# Patient Record
Sex: Female | Born: 1955
Health system: Southern US, Community
[De-identification: ages and names within clinical notes are randomized; demographics above are authoritative.]

## PROBLEM LIST (undated history)

## (undated) DIAGNOSIS — I10 Essential (primary) hypertension: Secondary | ICD-10-CM

## (undated) DIAGNOSIS — E785 Hyperlipidemia, unspecified: Secondary | ICD-10-CM

## (undated) DIAGNOSIS — N6019 Diffuse cystic mastopathy of unspecified breast: Secondary | ICD-10-CM

## (undated) DIAGNOSIS — M25475 Effusion, left foot: Secondary | ICD-10-CM

## (undated) DIAGNOSIS — Z86718 Personal history of other venous thrombosis and embolism: Secondary | ICD-10-CM

## (undated) DIAGNOSIS — G4730 Sleep apnea, unspecified: Secondary | ICD-10-CM

## (undated) DIAGNOSIS — Z85828 Personal history of other malignant neoplasm of skin: Secondary | ICD-10-CM

## (undated) DIAGNOSIS — R7303 Prediabetes: Secondary | ICD-10-CM

## (undated) DIAGNOSIS — E119 Type 2 diabetes mellitus without complications: Secondary | ICD-10-CM

## (undated) DIAGNOSIS — R011 Cardiac murmur, unspecified: Secondary | ICD-10-CM

## (undated) DIAGNOSIS — M25474 Effusion, right foot: Secondary | ICD-10-CM

## (undated) DIAGNOSIS — M25472 Effusion, left ankle: Secondary | ICD-10-CM

## (undated) DIAGNOSIS — M1990 Unspecified osteoarthritis, unspecified site: Secondary | ICD-10-CM

## (undated) DIAGNOSIS — C4490 Unspecified malignant neoplasm of skin, unspecified: Secondary | ICD-10-CM

## (undated) DIAGNOSIS — E876 Hypokalemia: Secondary | ICD-10-CM

## (undated) DIAGNOSIS — M25471 Effusion, right ankle: Secondary | ICD-10-CM

## (undated) HISTORY — DX: Sleep apnea, unspecified: G47.30

## (undated) HISTORY — DX: Essential (primary) hypertension: I10

## (undated) HISTORY — DX: Personal history of other venous thrombosis and embolism: Z86.718

## (undated) HISTORY — DX: Effusion, left ankle: M25.472

## (undated) HISTORY — DX: Prediabetes: R73.03

## (undated) HISTORY — DX: Effusion, right foot: M25.474

## (undated) HISTORY — DX: Type 2 diabetes mellitus without complications: E11.9

## (undated) HISTORY — DX: Diffuse cystic mastopathy of unspecified breast: N60.19

## (undated) HISTORY — DX: Effusion, right ankle: M25.475

## (undated) HISTORY — DX: Hypokalemia: E87.6

## (undated) HISTORY — DX: Effusion, right ankle: M25.471

---
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
## 2001-08-09 ENCOUNTER — Other Ambulatory Visit: Admission: RE | Admit: 2001-08-09 | Discharge: 2001-08-09 | Payer: Self-pay | Admitting: Internal Medicine

## 2002-09-26 ENCOUNTER — Other Ambulatory Visit: Admission: RE | Admit: 2002-09-26 | Discharge: 2002-09-26 | Payer: Self-pay | Admitting: Internal Medicine

## 2003-11-04 ENCOUNTER — Other Ambulatory Visit: Admission: RE | Admit: 2003-11-04 | Discharge: 2003-11-04 | Payer: Self-pay | Admitting: Internal Medicine

## 2004-12-23 ENCOUNTER — Other Ambulatory Visit: Admission: RE | Admit: 2004-12-23 | Discharge: 2004-12-23 | Payer: Self-pay | Admitting: Internal Medicine

## 2005-04-14 ENCOUNTER — Emergency Department: Payer: Self-pay | Admitting: Emergency Medicine

## 2005-04-24 DIAGNOSIS — Z86718 Personal history of other venous thrombosis and embolism: Secondary | ICD-10-CM

## 2005-04-24 HISTORY — PX: OOPHORECTOMY: SHX86

## 2005-04-24 HISTORY — DX: Personal history of other venous thrombosis and embolism: Z86.718

## 2006-01-03 ENCOUNTER — Other Ambulatory Visit: Admission: RE | Admit: 2006-01-03 | Discharge: 2006-01-03 | Payer: Self-pay | Admitting: Internal Medicine

## 2007-06-01 ENCOUNTER — Other Ambulatory Visit: Admission: RE | Admit: 2007-06-01 | Discharge: 2007-06-01 | Payer: Self-pay | Admitting: Internal Medicine

## 2008-08-28 ENCOUNTER — Ambulatory Visit: Payer: Self-pay | Admitting: Internal Medicine

## 2008-08-28 ENCOUNTER — Other Ambulatory Visit: Admission: RE | Admit: 2008-08-28 | Discharge: 2008-08-28 | Payer: Self-pay | Admitting: Internal Medicine

## 2009-03-06 ENCOUNTER — Ambulatory Visit: Payer: Self-pay | Admitting: Internal Medicine

## 2009-09-01 ENCOUNTER — Other Ambulatory Visit: Admission: RE | Admit: 2009-09-01 | Discharge: 2009-09-01 | Payer: Self-pay | Admitting: Internal Medicine

## 2009-09-01 ENCOUNTER — Ambulatory Visit: Payer: Self-pay | Admitting: Internal Medicine

## 2009-11-02 ENCOUNTER — Encounter: Payer: Self-pay | Admitting: Internal Medicine

## 2009-11-05 HISTORY — PX: COLONOSCOPY: SHX174

## 2010-03-18 ENCOUNTER — Ambulatory Visit: Payer: Self-pay | Admitting: Internal Medicine

## 2010-05-07 ENCOUNTER — Ambulatory Visit: Payer: Self-pay | Admitting: Internal Medicine

## 2010-09-27 ENCOUNTER — Other Ambulatory Visit: Payer: Self-pay | Admitting: Internal Medicine

## 2010-09-28 ENCOUNTER — Encounter (INDEPENDENT_AMBULATORY_CARE_PROVIDER_SITE_OTHER): Payer: BC Managed Care – PPO | Admitting: Internal Medicine

## 2010-09-28 DIAGNOSIS — I10 Essential (primary) hypertension: Secondary | ICD-10-CM

## 2010-09-28 DIAGNOSIS — E785 Hyperlipidemia, unspecified: Secondary | ICD-10-CM

## 2011-01-10 ENCOUNTER — Ambulatory Visit (INDEPENDENT_AMBULATORY_CARE_PROVIDER_SITE_OTHER): Payer: BC Managed Care – PPO | Admitting: Internal Medicine

## 2011-01-10 ENCOUNTER — Ambulatory Visit
Admission: RE | Admit: 2011-01-10 | Discharge: 2011-01-10 | Disposition: A | Discharge status: Home / Self Care | Payer: BC Managed Care – PPO | Source: Ambulatory Visit | Attending: Internal Medicine | Admitting: Internal Medicine

## 2011-01-10 ENCOUNTER — Encounter: Payer: Self-pay | Admitting: Internal Medicine

## 2011-01-10 ENCOUNTER — Other Ambulatory Visit: Payer: Self-pay | Admitting: Internal Medicine

## 2011-01-10 VITALS — BP 110/80 | Temp 98.9°F | Ht 66.5 in | Wt 168.0 lb

## 2011-01-10 DIAGNOSIS — S9030XA Contusion of unspecified foot, initial encounter: Secondary | ICD-10-CM

## 2011-01-10 DIAGNOSIS — I10 Essential (primary) hypertension: Secondary | ICD-10-CM

## 2011-01-10 DIAGNOSIS — T1490XA Injury, unspecified, initial encounter: Secondary | ICD-10-CM

## 2011-01-10 DIAGNOSIS — IMO0002 Reserved for concepts with insufficient information to code with codable children: Secondary | ICD-10-CM

## 2011-01-10 DIAGNOSIS — E785 Hyperlipidemia, unspecified: Secondary | ICD-10-CM

## 2011-01-10 DIAGNOSIS — S90819A Abrasion, unspecified foot, initial encounter: Secondary | ICD-10-CM

## 2011-01-10 NOTE — Progress Notes (Signed)
  Subjective:    Patient ID: Maria Mcpherson, female    DOB: Dec 11, 1955, 55 y.o.   MRN: [id number]  HPI patient had a horse stepped on her left lateral foot about 8 days ago. She has bruising proximal third and fourth toes. Foot itself is swollen red and a bit warm to touch. There is an abrasion left lateral foot that appears to be infected. DTaP vaccine was given February 2012. Tender along the left fifth metatarsal. Sent for x-ray showing no fracture just soft tissue swelling. Abraded area was cleaned and dressed with Telfa after peroxide and Bactroban  ointment applied. The  foot was wrapped in an Ace bandage.    Review of Systems     Objective:   Physical Exam quarter-sized abraded area left lateral mid foot appears to be secondarily infected. No frank drainage. Surrounding erythema and swelling. Bruising proximal third and fourth toes.        Assessment & Plan:  Keep foot wrapped with Ace bandage and told swelling resolves. Continue treating abrasion with peroxide and Bactroban ointment twice daily covering it with Telfa until abrasion heals liver. Keep foot elevated as much as possible. Prescribed Levaquin 500 mg daily for 10 days with food no refill. Call if not better in one week sooner if worse.

## 2011-01-10 NOTE — Patient Instructions (Signed)
Keep foot elevated as much as possible. Treat abrasion with peroxide and Bactroban ointment twice daily covering it with Telfa dressing. Keep treating abrasion until healed. Ace wrap is for support and swelling. Take antibiotic as prescribed. Call if not better in one week or sooner if worse

## 2011-01-20 ENCOUNTER — Ambulatory Visit (INDEPENDENT_AMBULATORY_CARE_PROVIDER_SITE_OTHER): Payer: BC Managed Care – PPO | Admitting: Internal Medicine

## 2011-01-20 ENCOUNTER — Encounter: Payer: Self-pay | Admitting: Internal Medicine

## 2011-01-20 ENCOUNTER — Telehealth: Payer: Self-pay | Admitting: Internal Medicine

## 2011-01-20 VITALS — BP 109/79 | HR 78 | Temp 98.3°F | Ht 66.5 in | Wt 165.0 lb

## 2011-01-20 DIAGNOSIS — L089 Local infection of the skin and subcutaneous tissue, unspecified: Secondary | ICD-10-CM

## 2011-01-20 DIAGNOSIS — IMO0002 Reserved for concepts with insufficient information to code with codable children: Secondary | ICD-10-CM

## 2011-01-20 NOTE — Progress Notes (Signed)
  Subjective:    Patient ID: Maria Mcpherson, female    DOB: 1955/08/05, 55 y.o.   MRN: [id number]  HPI patient returns after treating left lateral foot infection for 10 days with Levaquin 500 mg daily, peroxide, Neosporin ointment and covering it with a large Band-Aid. The size of the wound has decreased to about the size of a dime but has not completely healed and has some drainage from it. The ankle is slightly swollen. She cannot wear a boot to ride. Previous x-ray on June 18 was negative for fracture.    Review of Systems     Objective:   Physical Exam abraded area left lateral foot previously about the size of a quarter has decreased to about the size of a dime but has purulent drainage on the surface. This was debrided with peroxide and a gauze sponge. Bactroban ointment applied.        Assessment & Plan:  Resolving abrasion left lateral foot secondarily infected  Prescribe Bactroban ointment to put on abraded area twice daily after cleansing well with peroxide. Keep wound covered until healed.  Prescribed clindamycin 300 mg 3 times daily for 7 days.  Call if not better in 7-10 days.  Keep ankle and foot elevated as much as possible

## 2011-01-20 NOTE — Telephone Encounter (Signed)
Needs to be seen today

## 2011-01-20 NOTE — Telephone Encounter (Signed)
Called pt and left msg to call for appt today.

## 2011-01-20 NOTE — Patient Instructions (Signed)
Cleaned the foot wound with soap and water twice daily followed by peroxide and Bactroban ointment. Keep wound covered until healed. Try to keep leg elevated as much as possible. Take clindamycin 3 times daily with food for 7 days. Call if not better in 7-10 days

## 2011-04-04 ENCOUNTER — Other Ambulatory Visit: Payer: BC Managed Care – PPO | Admitting: Internal Medicine

## 2011-04-04 DIAGNOSIS — E119 Type 2 diabetes mellitus without complications: Secondary | ICD-10-CM

## 2011-04-04 DIAGNOSIS — E785 Hyperlipidemia, unspecified: Secondary | ICD-10-CM

## 2011-04-04 LAB — LIPID PANEL
Cholesterol: 190 mg/dL (ref 0–200)
Total CHOL/HDL Ratio: 3.9 Ratio

## 2011-04-04 LAB — HEPATIC FUNCTION PANEL
ALT: 19 U/L (ref 0–35)
AST: 26 U/L (ref 0–37)
Alkaline Phosphatase: 45 U/L (ref 39–117)
Bilirubin, Direct: 0.1 mg/dL (ref 0.0–0.3)
Indirect Bilirubin: 0.5 mg/dL (ref 0.0–0.9)
Total Bilirubin: 0.6 mg/dL (ref 0.3–1.2)

## 2011-04-04 LAB — HEMOGLOBIN A1C: Hgb A1c MFr Bld: 5.7 % — ABNORMAL HIGH (ref <5.7)

## 2011-04-05 ENCOUNTER — Ambulatory Visit (INDEPENDENT_AMBULATORY_CARE_PROVIDER_SITE_OTHER): Payer: BC Managed Care – PPO | Admitting: Internal Medicine

## 2011-04-05 ENCOUNTER — Encounter: Payer: Self-pay | Admitting: Internal Medicine

## 2011-04-05 DIAGNOSIS — E785 Hyperlipidemia, unspecified: Secondary | ICD-10-CM

## 2011-04-05 DIAGNOSIS — R7309 Other abnormal glucose: Secondary | ICD-10-CM

## 2011-04-05 DIAGNOSIS — I10 Essential (primary) hypertension: Secondary | ICD-10-CM

## 2011-04-05 DIAGNOSIS — R7303 Prediabetes: Secondary | ICD-10-CM | POA: Insufficient documentation

## 2011-04-05 NOTE — Progress Notes (Signed)
  Subjective:    Patient ID: Maria Mcpherson, female    DOB: 05-28-1956, 55 y.o.   MRN: [id number]  HPI  55 year old white female with history of prediabetes, hypertension, hyperlipidemia for six-month recheck. Patient is disappointed in her weight. Doesn't seem to be able to lose weight and hasn't been exercising. Cholesterol was not quite as good as it was in August 2011. Says husband has been cooking and using cheese and the food. Reviewed cholesterol panel with her. Blood pressures under good control on current regimen at 119/80. Hemoglobin A1c stable at 5.7%. Patient is a Equities trader and works a lot. Weight is 171 pounds.    Review of Systems     Objective:   Physical Exam chest is clear to auscultation; cardiac exam regular rate and rhythm normal S1 and S2; extremities without edema.        Assessment & Plan:  Prediabetes  Hypertension  Hyperlipidemia  Encouraged diet exercise and weight loss. Avoid excess cheese with meal preparation. Continue same dose of Zocor 40 mg daily. No change in antihypertensive regimen. Bookl examination in 6 months.

## 2011-05-12 ENCOUNTER — Other Ambulatory Visit: Payer: Self-pay | Admitting: Internal Medicine

## 2011-05-13 ENCOUNTER — Other Ambulatory Visit: Payer: Self-pay

## 2011-06-15 ENCOUNTER — Encounter: Payer: Self-pay | Admitting: Internal Medicine

## 2011-10-04 ENCOUNTER — Other Ambulatory Visit: Payer: BC Managed Care – PPO | Admitting: Internal Medicine

## 2011-10-04 DIAGNOSIS — Z0000 Encounter for general adult medical examination without abnormal findings: Secondary | ICD-10-CM

## 2011-10-04 LAB — CBC WITH DIFFERENTIAL/PLATELET
Eosinophils Absolute: 0.3 10*3/uL (ref 0.0–0.7)
HCT: 41.7 % (ref 36.0–46.0)
Hemoglobin: 13.9 g/dL (ref 12.0–15.0)
Lymphs Abs: 1.3 10*3/uL (ref 0.7–4.0)
MCH: 31.3 pg (ref 26.0–34.0)
Monocytes Absolute: 0.4 10*3/uL (ref 0.1–1.0)
Monocytes Relative: 8 % (ref 3–12)
Neutrophils Relative %: 60 % (ref 43–77)
RBC: 4.44 MIL/uL (ref 3.87–5.11)

## 2011-10-04 LAB — COMPREHENSIVE METABOLIC PANEL
BUN: 22 mg/dL (ref 6–23)
CO2: 30 mEq/L (ref 19–32)
Calcium: 9.6 mg/dL (ref 8.4–10.5)
Chloride: 102 mEq/L (ref 96–112)
Creat: 1.23 mg/dL — ABNORMAL HIGH (ref 0.50–1.10)
Glucose, Bld: 92 mg/dL (ref 70–99)
Total Bilirubin: 0.5 mg/dL (ref 0.3–1.2)

## 2011-10-04 LAB — LIPID PANEL
Cholesterol: 201 mg/dL — ABNORMAL HIGH (ref 0–200)
Total CHOL/HDL Ratio: 3.9 Ratio
Triglycerides: 205 mg/dL — ABNORMAL HIGH (ref <150)
VLDL: 41 mg/dL — ABNORMAL HIGH (ref 0–40)

## 2011-10-06 ENCOUNTER — Ambulatory Visit (INDEPENDENT_AMBULATORY_CARE_PROVIDER_SITE_OTHER): Payer: BC Managed Care – PPO | Admitting: Internal Medicine

## 2011-10-06 ENCOUNTER — Encounter: Payer: Self-pay | Admitting: Internal Medicine

## 2011-10-06 VITALS — BP 110/84 | HR 72 | Temp 98.1°F | Ht 65.5 in | Wt 166.0 lb

## 2011-10-06 DIAGNOSIS — R6889 Other general symptoms and signs: Secondary | ICD-10-CM

## 2011-10-06 DIAGNOSIS — N6019 Diffuse cystic mastopathy of unspecified breast: Secondary | ICD-10-CM

## 2011-10-06 DIAGNOSIS — E876 Hypokalemia: Secondary | ICD-10-CM

## 2011-10-06 LAB — CREATININE, SERUM: Creat: 1.16 mg/dL — ABNORMAL HIGH (ref 0.50–1.10)

## 2011-10-06 LAB — BUN: BUN: 27 mg/dL — ABNORMAL HIGH (ref 6–23)

## 2011-10-22 ENCOUNTER — Encounter: Payer: Self-pay | Admitting: Internal Medicine

## 2011-10-22 DIAGNOSIS — N6019 Diffuse cystic mastopathy of unspecified breast: Secondary | ICD-10-CM | POA: Insufficient documentation

## 2011-10-22 DIAGNOSIS — E876 Hypokalemia: Secondary | ICD-10-CM | POA: Insufficient documentation

## 2011-10-22 NOTE — Patient Instructions (Signed)
Try the diet exercise and lose weight. Return in 6 months for fasting lipid panel liver functions and hemoglobin A1c. Continue same medications.

## 2011-10-22 NOTE — Progress Notes (Signed)
  Subjective:    Patient ID: Maria Mcpherson, female    DOB: 10-Sep-1955, 56 y.o.   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 History of fibrocystic breast disease, history of pulmonary embolus September 2006 after removal of large ovarian fibroma which apparently got twisted with part of that ovarian vein involved resulting in pulmonary embolism. We followed her for several months on Coumadin. She's never had a recurrence. She only had a right oophorectomy not complete hysterectomy. Patient does not smoke or consume alcohol. History of tonsillectomy and adenoidectomy 1963. Patient has been in this practice since April 1992. Had colonoscopy April 2011 10 year followup advised. Had bone density study may 2011. Has had vaccinations for hepatitis A hepatitis B MMR and typhoid due to foreign travel.  Social patient is married. One of her sons passed away in 2007-10-22. Has a daughter and one son who are both young adults.  Family history: Mother with history of breast cancer and hypertension. Father in good health. 2 brothers in good health. One sister in good health.    Review of Systems  Constitutional: Negative.   HENT: Negative.   Eyes: Negative.   Respiratory: Negative.   Cardiovascular: Negative.   Gastrointestinal: Negative.   Genitourinary: Negative.   Neurological: Negative.   Hematological: Negative.   Psychiatric/Behavioral: Negative.        Objective:   Physical Exam  Vitals reviewed. Constitutional: She is oriented to person, place, and time. She appears well-developed and well-nourished. No distress.  HENT:  Head: Normocephalic and atraumatic.  Right Ear: External ear normal.  Left Ear: External ear normal.  Nose: Nose normal.  Mouth/Throat: Oropharynx is clear and moist. No oropharyngeal exudate.  Eyes: Conjunctivae  and EOM are normal. Pupils are equal, round, and reactive to light. Right eye exhibits no discharge. Left eye exhibits no discharge. No scleral icterus.  Neck: Neck supple. No JVD present. No thyromegaly present.  Cardiovascular: Normal rate, regular rhythm, normal heart sounds and intact distal pulses.   No murmur heard. Abdominal: Soft. Bowel sounds are normal. She exhibits no distension and no mass. There is no tenderness. There is no rebound and no guarding.  Genitourinary:       Bimanual normal  Musculoskeletal: She exhibits no edema.  Lymphadenopathy:    She has no cervical adenopathy.  Neurological: She is alert and oriented to person, place, and time. She has normal reflexes. No cranial nerve deficit. Coordination normal.  Skin: Skin is warm and dry. No rash noted. She is not diaphoretic.  Psychiatric: She has a normal mood and affect. Her behavior is normal. Judgment and thought content normal.          Assessment & Plan:  Hypertension  Hyperlipidemia  Prediabetes  Hypokalemia secondary to diuretic  Fibrocystic breast disease  History of pulmonary embolus related to twisting of ovarian vein during removal of large ovarian fibroma and right oophorectomy 2006. Hs never had a recurrence.  Plan: Patient should try to diet and exercise a bit more. Continue same medications. Return in 6 months. At that time will need fasting lipid panel liver functions and hemoglobin A1c. Reminded about annual mammography.

## 2012-01-20 ENCOUNTER — Other Ambulatory Visit: Payer: Self-pay | Admitting: Internal Medicine

## 2012-02-15 ENCOUNTER — Other Ambulatory Visit: Payer: Self-pay | Admitting: Internal Medicine

## 2012-04-16 ENCOUNTER — Other Ambulatory Visit: Payer: BC Managed Care – PPO | Admitting: Internal Medicine

## 2012-04-16 DIAGNOSIS — Z79899 Other long term (current) drug therapy: Secondary | ICD-10-CM

## 2012-04-16 DIAGNOSIS — E785 Hyperlipidemia, unspecified: Secondary | ICD-10-CM

## 2012-04-16 DIAGNOSIS — E119 Type 2 diabetes mellitus without complications: Secondary | ICD-10-CM

## 2012-04-16 DIAGNOSIS — I10 Essential (primary) hypertension: Secondary | ICD-10-CM

## 2012-04-16 LAB — HEPATIC FUNCTION PANEL
ALT: 16 U/L (ref 0–35)
AST: 21 U/L (ref 0–37)
Albumin: 4 g/dL (ref 3.5–5.2)
Alkaline Phosphatase: 41 U/L (ref 39–117)
Bilirubin, Direct: 0.1 mg/dL (ref 0.0–0.3)
Total Bilirubin: 0.8 mg/dL (ref 0.3–1.2)

## 2012-04-16 LAB — BASIC METABOLIC PANEL
BUN: 31 mg/dL — ABNORMAL HIGH (ref 6–23)
Chloride: 100 mEq/L (ref 96–112)
Potassium: 3.4 mEq/L — ABNORMAL LOW (ref 3.5–5.3)
Sodium: 139 mEq/L (ref 135–145)

## 2012-04-16 LAB — LIPID PANEL
Cholesterol: 184 mg/dL (ref 0–200)
HDL: 49 mg/dL (ref >39)
Total CHOL/HDL Ratio: 3.8 Ratio

## 2012-04-16 LAB — HEMOGLOBIN A1C
Hgb A1c MFr Bld: 5.7 % — ABNORMAL HIGH (ref <5.7)
Mean Plasma Glucose: 117 mg/dL — ABNORMAL HIGH (ref <117)

## 2012-04-17 ENCOUNTER — Encounter: Payer: Self-pay | Admitting: Internal Medicine

## 2012-04-17 ENCOUNTER — Ambulatory Visit (INDEPENDENT_AMBULATORY_CARE_PROVIDER_SITE_OTHER): Payer: BC Managed Care – PPO | Admitting: Internal Medicine

## 2012-04-17 VITALS — BP 110/82 | HR 76 | Temp 99.0°F | Wt 169.0 lb

## 2012-04-17 DIAGNOSIS — E785 Hyperlipidemia, unspecified: Secondary | ICD-10-CM

## 2012-04-17 DIAGNOSIS — R799 Abnormal finding of blood chemistry, unspecified: Secondary | ICD-10-CM

## 2012-04-17 DIAGNOSIS — I10 Essential (primary) hypertension: Secondary | ICD-10-CM

## 2012-04-17 DIAGNOSIS — E876 Hypokalemia: Secondary | ICD-10-CM

## 2012-04-17 DIAGNOSIS — R7989 Other specified abnormal findings of blood chemistry: Secondary | ICD-10-CM

## 2012-04-18 DIAGNOSIS — Z23 Encounter for immunization: Secondary | ICD-10-CM

## 2012-04-23 ENCOUNTER — Other Ambulatory Visit: Payer: Self-pay | Admitting: Internal Medicine

## 2012-05-01 ENCOUNTER — Other Ambulatory Visit: Payer: BC Managed Care – PPO | Admitting: Internal Medicine

## 2012-05-01 ENCOUNTER — Ambulatory Visit (INDEPENDENT_AMBULATORY_CARE_PROVIDER_SITE_OTHER): Payer: BC Managed Care – PPO | Admitting: Internal Medicine

## 2012-05-01 VITALS — BP 128/86

## 2012-05-01 DIAGNOSIS — I10 Essential (primary) hypertension: Secondary | ICD-10-CM

## 2012-05-01 DIAGNOSIS — E785 Hyperlipidemia, unspecified: Secondary | ICD-10-CM

## 2012-05-01 DIAGNOSIS — R7989 Other specified abnormal findings of blood chemistry: Secondary | ICD-10-CM

## 2012-05-01 LAB — BASIC METABOLIC PANEL
BUN: 17 mg/dL (ref 6–23)
Chloride: 102 mEq/L (ref 96–112)
Glucose, Bld: 103 mg/dL — ABNORMAL HIGH (ref 70–99)
Potassium: 3.7 mEq/L (ref 3.5–5.3)
Sodium: 141 mEq/L (ref 135–145)

## 2012-05-20 ENCOUNTER — Encounter: Payer: Self-pay | Admitting: Internal Medicine

## 2012-05-20 NOTE — Patient Instructions (Addendum)
Continue same medications and return in 6 months. Stay off diuretic. Stop potassium supplements since you are not taking diuretic.

## 2012-05-20 NOTE — Progress Notes (Signed)
  Subjective:    Patient ID: Maria Mcpherson, female    DOB: 17-May-1956, 56 y.o.   MRN: [id number]  HPI 56 year old white female seen recently for six-month followup and had significantly elevated serum creatinine of 1.39. She was on metoprolol and a diuretic Maxzide 25. She was fasting at the time because we needed to collect a fasting lipid panel. It was decided she would stop her diuretic and return for followup today. Blood pressure is excellent off diuretic. Be met was collected today to see if creatinine had improved. Patient seen only by nurse and not physician.    Review of Systems     Objective:   Physical Exam not examined. See blood pressure reading.        Assessment & Plan:  Elevated serum creatinine likely to be due to fasting state and diuretic  Hyperlipidemia  Hypertension  Plan: B- met shows considerable improvement off diuretic. Blood pressure stable off diuretic. Patient will continue with metoprolol only for nowfor hypertension and will return in 6 months for physical exam and fasting lab work. She should monitor her blood pressure at home. She might stop taking potassium supplements that she is not taking diuretic.

## 2012-05-22 ENCOUNTER — Other Ambulatory Visit: Payer: Self-pay | Admitting: Internal Medicine

## 2012-05-28 ENCOUNTER — Other Ambulatory Visit: Payer: Self-pay | Admitting: Internal Medicine

## 2012-06-22 ENCOUNTER — Encounter: Payer: Self-pay | Admitting: Internal Medicine

## 2012-06-22 NOTE — Patient Instructions (Addendum)
Stop diuretic. Continue beta blocker. Return in October for followup.

## 2012-06-22 NOTE — Progress Notes (Signed)
  Subjective:    Patient ID: Maria Mcpherson, female    DOB: 02/23/1956, 56 y.o.   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 She recently had fasting lipid panel, liver functions and basic metabolic panel. In reviewing these today I have noted that she has elevated serum creatinine. She was fasting at the time and likely was fine and completed. She is on a diuretic. She has a history of prediabetes and hemoglobin A1c is stable at 5.7%. She is on statin therapy. Lipid panel was excellent with the exception of triglycerides of 213. However creatinine is elevated at 1.34. She's never had a creatinine as high before. Blood pressure is excellent on diuretic and metoprolol. She tolerates the beta blocker well. Potassium is 3.4    Review of Systems     Objective:   Physical Exam neck is supple without JVD thyromegaly or carotid bruits. Skin is warm and dry. Affect is appropriate. Chest is clear to auscultation. Cardiac exam regular rate and rhythm normal S1 and S2. Extremities without edema        Assessment & Plan:  Hypertension  Hyperlipidemia  Elevated serum creatinine  Hypokalemic on diuretic therapy  Plan: We will stop diuretic and have her return in a few weeks and recheck serum creatinine and serum potassium. She will continue to watch her diet because of history of prediabetes.

## 2012-08-18 ENCOUNTER — Other Ambulatory Visit: Payer: Self-pay | Admitting: Internal Medicine

## 2012-09-25 ENCOUNTER — Other Ambulatory Visit: Payer: Self-pay | Admitting: Internal Medicine

## 2012-10-02 ENCOUNTER — Encounter: Payer: Self-pay | Admitting: Internal Medicine

## 2012-10-02 ENCOUNTER — Ambulatory Visit (INDEPENDENT_AMBULATORY_CARE_PROVIDER_SITE_OTHER): Payer: BC Managed Care – PPO | Admitting: Internal Medicine

## 2012-10-02 VITALS — BP 156/100 | HR 68 | Wt 176.0 lb

## 2012-10-02 DIAGNOSIS — R609 Edema, unspecified: Secondary | ICD-10-CM

## 2012-10-02 DIAGNOSIS — E876 Hypokalemia: Secondary | ICD-10-CM

## 2012-10-02 DIAGNOSIS — I10 Essential (primary) hypertension: Secondary | ICD-10-CM

## 2012-10-03 ENCOUNTER — Encounter: Payer: Self-pay | Admitting: Internal Medicine

## 2012-10-03 DIAGNOSIS — R609 Edema, unspecified: Secondary | ICD-10-CM | POA: Insufficient documentation

## 2012-10-03 MED ORDER — POTASSIUM CHLORIDE CRYS ER 20 MEQ PO TBCR: 20.0000 meq | EXTENDED_RELEASE_TABLET | Freq: Every day | ORAL | Status: DC

## 2012-10-03 MED ORDER — HYDROCHLOROTHIAZIDE 25 MG PO TABS: 25.0000 mg | ORAL_TABLET | Freq: Every day | ORAL | Status: DC

## 2012-10-03 NOTE — Progress Notes (Signed)
  Subjective:    Patient ID: Maria Mcpherson, female    DOB: [birth year], 57 y.o.   MRN: [id number]  HPI  At last visit, we discontinued Maxide 25 because we discovered BUN and creatinine improved when she was off diuretic. However, she has developed of dependent edema off diuretic and blood pressure has increased both systolic and diastolic readings.  In March 2013 creatinine was 1.16, in September 2013 creatinine was 1.39. We then discontinued Maxide 25 and in October 2013 creatinine improved to 0.99.  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 It has become uncomfortable to wear some shoes.  She likes being on metoprolol. She's been on it for many years. At one point we tried an ACE inhibitor and she developed a cough.  I    Review of Systems     Objective:   Physical Exam chest clear to auscultation. Cardiac exam regular rate and rhythm. Extremities: trace nonpitting lower extremity edema        Assessment & Plan:  Hypertension  Dependent edema  Plan: Add HCTZ 25 mg daily to metoprolol and followup in 2-3 weeks. Will likely need potassium supplement.

## 2012-10-03 NOTE — Patient Instructions (Addendum)
Add HCTZ 25 mg daily to metoprolol. Return in 2-3 weeks. Take potassium supplement also.

## 2012-10-22 ENCOUNTER — Other Ambulatory Visit: Payer: BC Managed Care – PPO | Admitting: Internal Medicine

## 2012-10-22 DIAGNOSIS — I10 Essential (primary) hypertension: Secondary | ICD-10-CM

## 2012-10-22 LAB — BASIC METABOLIC PANEL
BUN: 18 mg/dL (ref 6–23)
CO2: 31 mEq/L (ref 19–32)
Calcium: 9.5 mg/dL (ref 8.4–10.5)
Glucose, Bld: 105 mg/dL — ABNORMAL HIGH (ref 70–99)
Potassium: 3.4 mEq/L — ABNORMAL LOW (ref 3.5–5.3)
Sodium: 139 mEq/L (ref 135–145)

## 2012-10-23 ENCOUNTER — Encounter: Payer: Self-pay | Admitting: Internal Medicine

## 2012-10-23 ENCOUNTER — Ambulatory Visit (INDEPENDENT_AMBULATORY_CARE_PROVIDER_SITE_OTHER): Payer: BC Managed Care – PPO | Admitting: Internal Medicine

## 2012-10-23 VITALS — BP 136/88 | Temp 98.3°F | Wt 176.0 lb

## 2012-10-23 DIAGNOSIS — E876 Hypokalemia: Secondary | ICD-10-CM

## 2012-10-23 DIAGNOSIS — R609 Edema, unspecified: Secondary | ICD-10-CM

## 2012-10-23 DIAGNOSIS — I10 Essential (primary) hypertension: Secondary | ICD-10-CM

## 2012-10-23 MED ORDER — POTASSIUM CHLORIDE CRYS ER 20 MEQ PO TBCR: 20.0000 meq | EXTENDED_RELEASE_TABLET | Freq: Every day | ORAL | Status: DC

## 2012-11-04 ENCOUNTER — Other Ambulatory Visit: Payer: Self-pay | Admitting: Internal Medicine

## 2012-12-04 ENCOUNTER — Other Ambulatory Visit: Payer: Self-pay | Admitting: Internal Medicine

## 2012-12-05 ENCOUNTER — Other Ambulatory Visit: Payer: Self-pay

## 2012-12-05 DIAGNOSIS — E876 Hypokalemia: Secondary | ICD-10-CM

## 2012-12-05 MED ORDER — POTASSIUM CHLORIDE CRYS ER 20 MEQ PO TBCR: 20.0000 meq | EXTENDED_RELEASE_TABLET | Freq: Every day | ORAL | Status: DC

## 2012-12-24 ENCOUNTER — Other Ambulatory Visit: Payer: BC Managed Care – PPO | Admitting: Internal Medicine

## 2012-12-24 DIAGNOSIS — Z130 Encounter for screening for diseases of the blood and blood-forming organs and certain disorders involving the immune mechanism: Secondary | ICD-10-CM

## 2012-12-24 DIAGNOSIS — E785 Hyperlipidemia, unspecified: Secondary | ICD-10-CM

## 2012-12-24 DIAGNOSIS — I10 Essential (primary) hypertension: Secondary | ICD-10-CM

## 2012-12-24 DIAGNOSIS — Z79899 Other long term (current) drug therapy: Secondary | ICD-10-CM

## 2012-12-24 DIAGNOSIS — Z1329 Encounter for screening for other suspected endocrine disorder: Secondary | ICD-10-CM

## 2012-12-24 LAB — CBC WITH DIFFERENTIAL/PLATELET
Basophils Absolute: 0 10*3/uL (ref 0.0–0.1)
Basophils Relative: 1 % (ref 0–1)
Eosinophils Absolute: 0.3 10*3/uL (ref 0.0–0.7)
MCH: 30.7 pg (ref 26.0–34.0)
MCHC: 34.8 g/dL (ref 30.0–36.0)
Neutro Abs: 3.5 10*3/uL (ref 1.7–7.7)
Neutrophils Relative %: 60 % (ref 43–77)
RDW: 13.3 % (ref 11.5–15.5)

## 2012-12-24 LAB — COMPREHENSIVE METABOLIC PANEL
AST: 22 U/L (ref 0–37)
Albumin: 3.7 g/dL (ref 3.5–5.2)
Alkaline Phosphatase: 52 U/L (ref 39–117)
BUN: 25 mg/dL — ABNORMAL HIGH (ref 6–23)
Potassium: 4.2 mEq/L (ref 3.5–5.3)
Sodium: 138 mEq/L (ref 135–145)
Total Bilirubin: 0.5 mg/dL (ref 0.3–1.2)
Total Protein: 6.5 g/dL (ref 6.0–8.3)

## 2012-12-24 LAB — TSH: TSH: 1.076 u[IU]/mL (ref 0.350–4.500)

## 2012-12-24 LAB — LIPID PANEL
HDL: 58 mg/dL (ref >39)
LDL Cholesterol: 91 mg/dL (ref 0–99)
Triglycerides: 162 mg/dL — ABNORMAL HIGH (ref <150)
VLDL: 32 mg/dL (ref 0–40)

## 2012-12-31 ENCOUNTER — Other Ambulatory Visit: Payer: BC Managed Care – PPO | Admitting: Internal Medicine

## 2013-01-01 ENCOUNTER — Ambulatory Visit (INDEPENDENT_AMBULATORY_CARE_PROVIDER_SITE_OTHER): Payer: BC Managed Care – PPO | Admitting: Internal Medicine

## 2013-01-01 ENCOUNTER — Other Ambulatory Visit: Payer: Self-pay

## 2013-01-01 ENCOUNTER — Encounter: Payer: Self-pay | Admitting: Internal Medicine

## 2013-01-01 ENCOUNTER — Other Ambulatory Visit (HOSPITAL_COMMUNITY)
Admission: RE | Admit: 2013-01-01 | Discharge: 2013-01-01 | Disposition: A | Discharge status: Home / Self Care | Payer: BC Managed Care – PPO | Source: Ambulatory Visit | Attending: Internal Medicine | Admitting: Internal Medicine

## 2013-01-01 VITALS — BP 142/78 | HR 74 | Temp 99.0°F | Wt 174.0 lb

## 2013-01-01 DIAGNOSIS — E785 Hyperlipidemia, unspecified: Secondary | ICD-10-CM

## 2013-01-01 DIAGNOSIS — Z01419 Encounter for gynecological examination (general) (routine) without abnormal findings: Secondary | ICD-10-CM

## 2013-01-01 DIAGNOSIS — I10 Essential (primary) hypertension: Secondary | ICD-10-CM

## 2013-01-01 DIAGNOSIS — N6019 Diffuse cystic mastopathy of unspecified breast: Secondary | ICD-10-CM

## 2013-01-01 DIAGNOSIS — Z86711 Personal history of pulmonary embolism: Secondary | ICD-10-CM

## 2013-01-01 DIAGNOSIS — E876 Hypokalemia: Secondary | ICD-10-CM

## 2013-01-01 DIAGNOSIS — R7309 Other abnormal glucose: Secondary | ICD-10-CM

## 2013-01-01 DIAGNOSIS — R7303 Prediabetes: Secondary | ICD-10-CM

## 2013-01-01 DIAGNOSIS — Z0000 Encounter for general adult medical examination without abnormal findings: Secondary | ICD-10-CM

## 2013-01-01 DIAGNOSIS — R609 Edema, unspecified: Secondary | ICD-10-CM

## 2013-02-08 ENCOUNTER — Other Ambulatory Visit: Payer: Self-pay | Admitting: Internal Medicine

## 2013-02-17 ENCOUNTER — Other Ambulatory Visit: Payer: Self-pay | Admitting: Internal Medicine

## 2013-04-16 ENCOUNTER — Encounter: Payer: Self-pay | Admitting: Internal Medicine

## 2013-04-16 NOTE — Progress Notes (Signed)
  Subjective:    Patient ID: Maria Mcpherson, female    DOB: 05-12-1956, 57 y.o.   MRN: [id number]  HPI At last visit, blood pressure was 156/100 and she had significant dependent edema. Maxide had been discontinued in the Fall of 2013 because of elevated serum creatinine. Creatinine did improve with discontinuance of Maxide. However dependent edema ensued.    Review of Systems     Objective:   Physical Exam Decreased dependent edema on HCTZ 25 mg daily. Blood pressure is better controlled. Chest clear to auscultation. Cardiac exam regular rate and rhythm.        Assessment & Plan:  Hypertension  Dependent edema  History of hyperlipidemia on statin therapy  Plan: Continue HCTZ 25 mg daily along with metoprolol. Return in 2 months.

## 2013-04-16 NOTE — Patient Instructions (Addendum)
Continue HCTZ with metoprolol. Return in 2 months

## 2013-05-03 ENCOUNTER — Other Ambulatory Visit: Payer: Self-pay | Admitting: Internal Medicine

## 2013-05-03 ENCOUNTER — Ambulatory Visit (INDEPENDENT_AMBULATORY_CARE_PROVIDER_SITE_OTHER): Payer: BC Managed Care – PPO

## 2013-05-03 DIAGNOSIS — Z23 Encounter for immunization: Secondary | ICD-10-CM

## 2013-06-04 ENCOUNTER — Other Ambulatory Visit: Payer: Self-pay | Admitting: Internal Medicine

## 2013-06-23 NOTE — Patient Instructions (Addendum)
Continue same medications and return in 6 months 

## 2013-06-23 NOTE — Progress Notes (Signed)
Subjective:    Patient ID: Maria Mcpherson, female    DOB: [birth year], 57 y.o.   MRN: [id number]  HPI  62 white Female in today for health maintenance and evaluation of medical issues. History of hypertension, hyperlipidemia, dependent edema, prediabetes, hypokalemia related to diuretic therapy, and fibrocystic breast disease.  Past medical history: History of pulmonary embolus September 2006 after removal of a large ovarian fibroma which apparently got twisted with part of the ovarian pain resulting in pulmonary embolus. She was followed on Coumadin here for several months. She's never had a recurrence. She only had a right oophorectomy not a complete hysterectomy. History of tonsillectomy and adenoidectomy 1963.  Had colonoscopy April 2011 with 10 year followup advised. Had bone density study in 2011. Has had vaccinations for hepatitis A, hepatitis B, MMR and typhoid due to foreign travel.  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 Son passed away in 09/26/07. Daughter is residing in [place]. She her husband have become foster parents to teenage boy and girl through their church. Patient does not smoke or consume alcohol. Tries to walk for exercise.  Family history: Mother with history of breast cancer and hypertension. Father in good health. 2 brothers in good health. One sister in good health.  Prediabetes is treated with diet and exercise. Hemoglobin A1c in 2013 was 5.9% but on repeat study several months later it was 5.7%.   Review of Systems  Constitutional: Negative.   HENT: Negative.   Eyes: Negative.   Respiratory: Negative.   Cardiovascular: Positive for leg swelling.  Gastrointestinal: Negative.   Endocrine: Negative.   Genitourinary: Negative.   Allergic/Immunologic: Negative.   Neurological: Negative.   Hematological: Negative.   Psychiatric/Behavioral: Negative.        Objective:   Physical Exam  Vitals  reviewed. Constitutional: She is oriented to person, place, and time. She appears well-developed and well-nourished. No distress.  HENT:  Head: Normocephalic and atraumatic.  Right Ear: External ear normal.  Left Ear: External ear normal.  Mouth/Throat: Oropharynx is clear and moist. No oropharyngeal exudate.  Eyes: Conjunctivae are normal. Right eye exhibits no discharge. Left eye exhibits no discharge. No scleral icterus.  Neck: Neck supple. No JVD present. No thyromegaly present.  Cardiovascular: Normal rate, normal heart sounds and intact distal pulses.   No murmur heard. Pulmonary/Chest: Effort normal and breath sounds normal. No respiratory distress. She has no wheezes. She has no rales.  Abdominal: Soft. Bowel sounds are normal. She exhibits no distension and no mass. There is no tenderness. There is no rebound and no guarding.  Genitourinary: Vagina normal and uterus normal. No vaginal discharge found.  Pap taken bimanual normal  Musculoskeletal: Normal range of motion.  Trace dependent edema  Lymphadenopathy:    She has no cervical adenopathy.  Neurological: She is alert and oriented to person, place, and time. She has normal reflexes. No cranial nerve deficit. Coordination normal.  Skin: Skin is warm and dry. No rash noted. She is not diaphoretic.  Psychiatric: She has a normal mood and affect. Her behavior is normal. Judgment and thought content normal.       Assessment & Plan:  Hypertension-stable  Dependent edema-stable with diuretic  Hyperlipidemia-on statin therapy  History of prediabetes-treated with diet and exercise  History of pulmonary embolus status post GYN surgery 2006 with no recurrence  Hypokalemia secondary to diuretic therapy  History of fibrocystic breast disease  Plan: Encouraged diet and exercise with weight  loss. Return in 6 months. Continue same medication. Check hemoglobin A1c at next visit.

## 2013-07-04 ENCOUNTER — Ambulatory Visit: Payer: BC Managed Care – PPO | Admitting: Internal Medicine

## 2013-07-05 ENCOUNTER — Encounter: Payer: Self-pay | Admitting: Internal Medicine

## 2013-07-05 ENCOUNTER — Ambulatory Visit (INDEPENDENT_AMBULATORY_CARE_PROVIDER_SITE_OTHER): Payer: BC Managed Care – PPO | Admitting: Internal Medicine

## 2013-07-05 VITALS — BP 130/80 | HR 68 | Temp 97.8°F | Ht 66.0 in | Wt 167.0 lb

## 2013-07-05 DIAGNOSIS — I10 Essential (primary) hypertension: Secondary | ICD-10-CM

## 2013-07-05 LAB — BASIC METABOLIC PANEL
Chloride: 97 mEq/L (ref 96–112)
Potassium: 3.3 mEq/L — ABNORMAL LOW (ref 3.5–5.3)
Sodium: 141 mEq/L (ref 135–145)

## 2013-07-08 NOTE — Progress Notes (Signed)
LMOM for patient to return my call.  Will advise patient that she needs to be sure she's taking Potassium.  Awaiting return call from patient.

## 2013-07-09 ENCOUNTER — Telehealth: Payer: Self-pay | Admitting: Internal Medicine

## 2013-07-09 NOTE — Telephone Encounter (Signed)
Prevent arrythmia's.  Patient states she sometimes forgets that it is not a vitamin and has gone without it.  Went over the levels of where the Cardiologist would like for her to be.  Discussed where her levels are currently and have been in the past.  Patient verbalizes understanding of taking it on a routine basis.

## 2013-09-04 ENCOUNTER — Other Ambulatory Visit: Payer: Self-pay | Admitting: Internal Medicine

## 2013-09-28 ENCOUNTER — Other Ambulatory Visit: Payer: Self-pay | Admitting: Internal Medicine

## 2013-10-16 ENCOUNTER — Other Ambulatory Visit: Payer: Self-pay | Admitting: Internal Medicine

## 2013-11-04 ENCOUNTER — Other Ambulatory Visit: Payer: Self-pay | Admitting: Internal Medicine

## 2013-12-23 ENCOUNTER — Encounter: Payer: Self-pay | Admitting: Internal Medicine

## 2013-12-23 NOTE — Progress Notes (Signed)
   Subjective:    Patient ID: Maria Mcpherson, female    DOB: 22-Aug-1955, 58 y.o.   MRN: [id number]  HPI Six-month followup on hypertension, hyperlipidemia and hypokalemia secondary to diuretic therapy. Patient feels well with no new complaints. May be missing some doses of potassium supplement. Likes beta blocker. It keeps her calm at work. She is a Secondary school teacher.    Review of Systems     Objective:   Physical Exam Skin warm and dry. Nodes none. Chest clear to auscultation. Cardiac exam regular rate and rhythm normal S1 and S2. Extremities without edema. Neck: No JVD, No thyromegaly or adenopathy.       Assessment & Plan:  Hypertension-stable on current regimen  Hyperlipidemia-lipid panel not drawn.  Hypokalemia-would like to see potassium at 4.0 or better. Reminded patient to take potassium supplement on daily basis. Take Klor-Con 20 meq daily  Plan: Return in 6 months for physical examination. Continue same medications.  25 minutes spent with patient.

## 2013-12-23 NOTE — Patient Instructions (Signed)
Continue same medications. Remember to take potassium supplement. Return in 6 months.

## 2014-01-07 ENCOUNTER — Other Ambulatory Visit: Payer: Self-pay | Admitting: Internal Medicine

## 2014-01-07 ENCOUNTER — Other Ambulatory Visit: Payer: BC Managed Care – PPO | Admitting: Internal Medicine

## 2014-01-07 DIAGNOSIS — Z13228 Encounter for screening for other metabolic disorders: Secondary | ICD-10-CM

## 2014-01-07 DIAGNOSIS — I10 Essential (primary) hypertension: Secondary | ICD-10-CM

## 2014-01-07 DIAGNOSIS — E785 Hyperlipidemia, unspecified: Secondary | ICD-10-CM

## 2014-01-07 DIAGNOSIS — R7303 Prediabetes: Secondary | ICD-10-CM

## 2014-01-07 DIAGNOSIS — Z1329 Encounter for screening for other suspected endocrine disorder: Secondary | ICD-10-CM

## 2014-01-07 DIAGNOSIS — Z130 Encounter for screening for diseases of the blood and blood-forming organs and certain disorders involving the immune mechanism: Secondary | ICD-10-CM

## 2014-01-08 LAB — HEMOGLOBIN A1C
HEMOGLOBIN A1C: 5.6 % (ref <5.7)
Mean Plasma Glucose: 114 mg/dL (ref <117)

## 2014-01-08 LAB — CBC WITH DIFFERENTIAL/PLATELET
Basophils Absolute: 0 10*3/uL (ref 0.0–0.1)
Basophils Relative: 0 % (ref 0–1)
EOS ABS: 0.2 10*3/uL (ref 0.0–0.7)
EOS PCT: 3 % (ref 0–5)
HEMATOCRIT: 43.1 % (ref 36.0–46.0)
HEMOGLOBIN: 14.8 g/dL (ref 12.0–15.0)
LYMPHS ABS: 1.6 10*3/uL (ref 0.7–4.0)
LYMPHS PCT: 32 % (ref 12–46)
MCH: 30.6 pg (ref 26.0–34.0)
MCHC: 34.3 g/dL (ref 30.0–36.0)
MCV: 89.2 fL (ref 78.0–100.0)
MONO ABS: 0.3 10*3/uL (ref 0.1–1.0)
MONOS PCT: 6 % (ref 3–12)
Neutro Abs: 3 10*3/uL (ref 1.7–7.7)
Neutrophils Relative %: 59 % (ref 43–77)
Platelets: 230 10*3/uL (ref 150–400)
RBC: 4.83 MIL/uL (ref 3.87–5.11)
RDW: 13.6 % (ref 11.5–15.5)
WBC: 5 10*3/uL (ref 4.0–10.5)

## 2014-01-08 LAB — COMPREHENSIVE METABOLIC PANEL
ALT: 19 U/L (ref 0–35)
AST: 23 U/L (ref 0–37)
Albumin: 4 g/dL (ref 3.5–5.2)
Alkaline Phosphatase: 51 U/L (ref 39–117)
BUN: 24 mg/dL — ABNORMAL HIGH (ref 6–23)
CO2: 26 meq/L (ref 19–32)
CREATININE: 1.15 mg/dL — AB (ref 0.50–1.10)
Calcium: 9.8 mg/dL (ref 8.4–10.5)
Chloride: 100 mEq/L (ref 96–112)
GLUCOSE: 92 mg/dL (ref 70–99)
Potassium: 3.7 mEq/L (ref 3.5–5.3)
Sodium: 139 mEq/L (ref 135–145)
Total Bilirubin: 0.6 mg/dL (ref 0.2–1.2)
Total Protein: 6.7 g/dL (ref 6.0–8.3)

## 2014-01-08 LAB — TSH: TSH: 1.12 u[IU]/mL (ref 0.350–4.500)

## 2014-01-08 LAB — VITAMIN D 25 HYDROXY (VIT D DEFICIENCY, FRACTURES): Vit D, 25-Hydroxy: 48 ng/mL (ref 30–89)

## 2014-01-08 LAB — LIPID PANEL
CHOL/HDL RATIO: 3 ratio
CHOLESTEROL: 200 mg/dL (ref 0–200)
HDL: 66 mg/dL (ref >39)
LDL Cholesterol: 114 mg/dL — ABNORMAL HIGH (ref 0–99)
Triglycerides: 101 mg/dL (ref <150)
VLDL: 20 mg/dL (ref 0–40)

## 2014-01-09 ENCOUNTER — Ambulatory Visit (INDEPENDENT_AMBULATORY_CARE_PROVIDER_SITE_OTHER): Payer: BC Managed Care – PPO | Admitting: Internal Medicine

## 2014-01-09 ENCOUNTER — Encounter: Payer: Self-pay | Admitting: Internal Medicine

## 2014-01-09 VITALS — BP 140/92 | HR 68 | Temp 98.6°F | Ht 66.5 in | Wt 170.0 lb

## 2014-01-09 DIAGNOSIS — R7309 Other abnormal glucose: Secondary | ICD-10-CM

## 2014-01-09 DIAGNOSIS — N6019 Diffuse cystic mastopathy of unspecified breast: Secondary | ICD-10-CM

## 2014-01-09 DIAGNOSIS — R7303 Prediabetes: Secondary | ICD-10-CM

## 2014-01-09 DIAGNOSIS — N939 Abnormal uterine and vaginal bleeding, unspecified: Secondary | ICD-10-CM

## 2014-01-09 DIAGNOSIS — I10 Essential (primary) hypertension: Secondary | ICD-10-CM

## 2014-01-09 DIAGNOSIS — E876 Hypokalemia: Secondary | ICD-10-CM

## 2014-01-09 DIAGNOSIS — E785 Hyperlipidemia, unspecified: Secondary | ICD-10-CM

## 2014-01-09 DIAGNOSIS — N898 Other specified noninflammatory disorders of vagina: Secondary | ICD-10-CM

## 2014-01-09 DIAGNOSIS — Z0000 Encounter for general adult medical examination without abnormal findings: Secondary | ICD-10-CM

## 2014-01-09 LAB — POCT URINALYSIS DIPSTICK
Bilirubin, UA: NEGATIVE
Blood, UA: NEGATIVE
Glucose, UA: NEGATIVE
KETONES UA: NEGATIVE
LEUKOCYTES UA: NEGATIVE
Nitrite, UA: NEGATIVE
PROTEIN UA: NEGATIVE
Spec Grav, UA: 1.01
UROBILINOGEN UA: NEGATIVE
pH, UA: 6

## 2014-01-09 LAB — FOLLICLE STIMULATING HORMONE: FSH: 165.3 m[IU]/mL — AB

## 2014-01-09 NOTE — Patient Instructions (Addendum)
Watch BP and call if BP remains elevated.  Call GYN for appt. Mammogram due in November. Return in 6 months

## 2014-01-13 ENCOUNTER — Telehealth: Payer: Self-pay

## 2014-01-13 DIAGNOSIS — N950 Postmenopausal bleeding: Secondary | ICD-10-CM

## 2014-01-13 NOTE — Telephone Encounter (Signed)
amb referral done in Epic. [facility] will call patient to schedule.

## 2014-01-13 NOTE — Progress Notes (Signed)
Referral done in Epic

## 2014-01-15 ENCOUNTER — Telehealth: Payer: Self-pay | Admitting: Obstetrics & Gynecology

## 2014-01-15 NOTE — Telephone Encounter (Addendum)
lmtcb to schedule a new patient doctor referral appointment. Holding 01/16/14 at 10 AM for patient.

## 2014-01-16 ENCOUNTER — Encounter: Payer: Self-pay | Admitting: Obstetrics & Gynecology

## 2014-01-16 ENCOUNTER — Ambulatory Visit (INDEPENDENT_AMBULATORY_CARE_PROVIDER_SITE_OTHER): Payer: BC Managed Care – PPO | Admitting: Obstetrics & Gynecology

## 2014-01-16 VITALS — BP 138/86 | HR 64 | Resp 16 | Ht 66.5 in | Wt 168.4 lb

## 2014-01-16 DIAGNOSIS — Z124 Encounter for screening for malignant neoplasm of cervix: Secondary | ICD-10-CM

## 2014-01-16 DIAGNOSIS — N950 Postmenopausal bleeding: Secondary | ICD-10-CM

## 2014-01-16 NOTE — Progress Notes (Addendum)
58 y.o. [id number] MarriedCaucasianF here for new patient visit.  PCP:  Dr. Renold Genta.  Pt's cycles have been decreasing over the past few years.  This year, went about nine months without a cycle and then had very heavy bleeding for about 7 days.  Saw Dr. Renold Genta for AEX.  FSH was obtained and was 165.  Pt reported no "hormonal symptoms before the episode of bleeding".  Minimal cramping.  She is now very worried about this.  H/O oophorectomy and myomectomy 2006--per pt.  Reviewed Care Everywhere and this appears to only have been her right ovary removed.  Was a fibroma.  Pt reports she had a post operative PE, however review of records shows this to have occurred in September and the surgery was in October.  Pt reports she was treated with Coumadin for 6 months.  Surgery was at [facility].  Post clot evaluation was negative per pt.  Reviewed labs in [place] which was all normal.  Ca-125 was 43 at the time.  Cannot see/find any coagulopathy evaluation.  She is a Secondary school teacher.  Patient's last menstrual period was 12/04/2013.          Sexually active: no  The current method of family planning is none.    Exercising: no  not regularly Smoker:  no  Health Maintenance: Pap:  2014-WNL History of abnormal Pap:  no MMG:  06/12/13-normal Colonoscopy:  Around 2010-repeat in 10 years BMD:   Had baseline ? year TDaP:  Up to date with Dr Renold Genta Screening Labs: PCP, Hb today: PCP, Urine today: PCP   reports that she has never smoked. She has never used smokeless tobacco. She reports that she does not drink alcohol or use illicit drugs.  Past Medical History  Diagnosis Date  . Fibrocystic breast disease   . HTN (hypertension)   . Other and unspecified hyperlipidemia   . Hypokalemia   . H/O blood clots     pulmonary blood clot-at the same time as right oophorectomy, secondary fibroid    Past Surgical History  Procedure Laterality Date  . Tonsillectomy and adenoidectomy  1963  . Oophorectomy   10/06    rt-had pulmonary blood clot    Current Outpatient Prescriptions  Medication Sig Dispense Refill  . aspirin 81 MG EC tablet Take 81 mg by mouth daily.        . calcium carbonate (OS-CAL) 600 MG TABS Take 600 mg by mouth 2 (two) times daily with a meal.        . fish oil-omega-3 fatty acids 1000 MG capsule Take 1 g by mouth daily.        . hydrochlorothiazide (HYDRODIURIL) 25 MG tablet TAKE 1 TABLET (25 MG TOTAL) BY MOUTH DAILY.  90 tablet  3  . KLOR-CON M20 20 MEQ tablet TAKE 1 TABLET BY MOUTH EVERY DAY  30 tablet  5  . Loratadine (CLARITIN) 10 MG CAPS Take by mouth.        . metoprolol (LOPRESSOR) 50 MG tablet TAKE 1 TABLET BY MOUTH EVERY DAY  30 tablet  7  . Nutritional Supplements (JUICE PLUS FIBRE PO) Take by mouth.      . simvastatin (ZOCOR) 40 MG tablet TAKE 1 TABLET BY MOUTH AT BEDTIME  30 tablet  5   No current facility-administered medications for this visit.    Family History  Problem Relation Age of Onset  . Breast cancer Mother 62    mid 4s  . Leukemia Father   .  Hypertension Mother   . Stroke Maternal Grandfather     ROS:  Pertinent items are noted in HPI.  Otherwise, a comprehensive ROS was negative.  Exam:   BP 138/86  Pulse 64  Resp 16  Ht 5' 6.5" (1.689 m)  Wt 168 lb 6.4 oz (76.386 kg)  BMI 26.78 kg/m2  LMP 12/04/2013    Height: 5' 6.5" (168.9 cm)  Ht Readings from Last 3 Encounters:  01/16/14 5' 6.5" (1.689 m)  01/09/14 5' 6.5" (1.689 m)  07/05/13 5\' 6"  (1.676 m)    General appearance: alert, cooperative and appears stated age Head: Normocephalic, without obvious abnormality, atraumatic Abdomen: soft, non-tender; bowel sounds normal; no masses,  no organomegaly Extremities: extremities normal, atraumatic, no cyanosis or edema Skin: Skin color, texture, turgor normal. No rashes or lesions Lymph nodes: Cervical, supraclavicular, and axillary nodes normal. No abnormal inguinal nodes palpated Neurologic: Grossly normal   Pelvic: External  genitalia:  no lesions              Urethra:  normal appearing urethra with no masses, tenderness or lesions              Bartholins and Skenes: normal                 Vagina: normal appearing vagina with normal color and discharge, no lesions              Cervix: no lesions              Pap taken: yes Bimanual Exam:  Uterus:  enlarged, about 8 weeks with 3cm feeling fibroid off right side of uterus weeks size              Adnexa: normal adnexa and no mass, fullness, tenderness               Rectovaginal: Confirms               Anus:  normal sphincter tone, no lesions  Endometrial biopsy recommended.  Discussed with patient.  Verbal and written consent obtained.   Procedure:  Speculum placed.  Cervix visualized and cleansed with betadine prep.  A single toothed tenaculum was applied to the anterior lip of the cervix.  Endometrial pipelle was advanced through the cervix into the endometrial cavity without difficulty.  Pipelle passed to 7.5cm.  Suction applied and pipelle removed with good tissue sample obtained.  Tenculum removed.  No bleeding noted.  Patient tolerated procedure well.   A:  PMP bleeding in pt with [place] of 165 H/O PE 2006, negative evaluation per pt with coumadin use for six months H/O oophrectomy 2006 due to fibroma H/O uterine fibroids  P:   Endometrial biopsy and Pap with HR HPV pending.  Results and further recommendations will be made pending results.  Will probably proceed with PUS as well.  04/03/14--notes regarding PE came from Dr. Verlene Mayer office and are scanned into EPIC.  W/U done at [facility] in notes was negative.  Actual test results did not come in results.)  An After Visit Summary was printed and given to the patient.

## 2014-01-16 NOTE — Patient Instructions (Signed)

## 2014-01-20 LAB — IPS PAP TEST WITH HPV

## 2014-01-21 ENCOUNTER — Telehealth: Payer: Self-pay | Admitting: *Deleted

## 2014-01-21 DIAGNOSIS — N950 Postmenopausal bleeding: Secondary | ICD-10-CM

## 2014-01-21 NOTE — Telephone Encounter (Signed)
Message copied by Jaymes Graff on Tue Jan 21, 2014  9:34 AM ------      Message from: Megan Salon      Created: Mon Jan 20, 2014 12:13 PM       Please call pt.  Inform Pap and HR HPV are neg.  Endometrial biopsy is negative for abnormal cells.  Needs SHG next.  Please schedule.  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 ------

## 2014-01-21 NOTE — Telephone Encounter (Signed)
Patient notified of results of pap and endometrial biopsy.  Now recommend PUS SHGM for further evaluation of PMB. Procedure explained and questions answered. Scheduled for 01-23-14 at 2pm. Instructed to take Motrin 800 mg 1 hour prior with food.  Routing to provider for final review. Patient agreeable to disposition. Will close encounter  Sabrina, please precert for [facility].

## 2014-01-23 ENCOUNTER — Ambulatory Visit (INDEPENDENT_AMBULATORY_CARE_PROVIDER_SITE_OTHER): Payer: BC Managed Care – PPO

## 2014-01-23 ENCOUNTER — Other Ambulatory Visit: Payer: Self-pay | Admitting: Obstetrics & Gynecology

## 2014-01-23 ENCOUNTER — Ambulatory Visit (INDEPENDENT_AMBULATORY_CARE_PROVIDER_SITE_OTHER): Payer: BC Managed Care – PPO | Admitting: Obstetrics & Gynecology

## 2014-01-23 VITALS — BP 122/84 | Ht 66.5 in | Wt 167.0 lb

## 2014-01-23 DIAGNOSIS — N950 Postmenopausal bleeding: Secondary | ICD-10-CM

## 2014-01-23 DIAGNOSIS — D251 Intramural leiomyoma of uterus: Secondary | ICD-10-CM

## 2014-01-23 NOTE — Progress Notes (Signed)
58 y.o. [id number] Marriedfemale here for a pelvic ultrasound due to PMP bleeding, enlarged uterus.  Pt had episode of bleeding that lasted for 7 days .  It was like a normal period to her.  She was seen by Dr. Renold Genta who tested her [facility] which was 165.  Pt seen by me on 6/25 for initial visit and an endometrial biopsy was performed.  This showed weakly proliferative endometrium.    After she was in the office on 01/16/14, I was able to see her pathology from her prior surgery at [facility].  Some of the history that she gave was not exactly correct so we reviewed this together today.  It appears that she had a large fibroma removed.  She thought she had a myoma removed as well but the pathology does not appear to show this.  She does have a hx of fibroids.  One one prior imaging was greater than 6cm.  Patient's last menstrual period was 12/04/2013.  Sexually active:  no  Contraception: n/a  Technique:  Both transabdominal and transvaginal ultrasound examinations of the pelvis were performed. Transabdominal technique was performed for global imaging of the pelvis including uterus, ovaries, adnexal regions, and pelvic cul-de-sac.  It was necessary to proceed with endovaginal exam following the abdominal ultrasound transabdominal exam to visualize the endometrium and adnexa.  Color and duplex Doppler ultrasound was utilized to evaluate blood flow to the ovaries.   FINDINGS: UTERUS: 9.0 x 5.6 x 5.2cm with multiple fibroids measuring 2.1, 2.0, and 1.8cm EMS: 5.35mm ADNEXA:   Left ovary 2.2 x 1.7 x 1.3cm   Right ovary cannot be identified, appears absent (which is consistent with pathology report I can review) CUL DE SAC: no free fluid  SHSG:  After obtaining appropriate verbal consent from patient, the cervix was visualized using a speculum, and prepped with betadine.  A tenaculum  was applied to the cervix.  Dilation of the cervix was not necessary. The catheter was passed into the uterus and sterile saline  introduced, with the following findings: no intracavitary lesions were noted, endometrium was symmetric  Images reviewed with pt.  As she has a negative endometrial biopsy and known fibroids noted on ultrasound, feel we can just watch conservatively.  She knows if she bleeds again that I will want to see her and I would proceed with D&C.  She voices understanding and agreement with plan.   Assessment: PMP with negative SHGM except for fibroids and negative endometrial biopsy  Plan:  Follow conservatively.  Pt will call if she has any additional bleeding   ~15 minutes spent with patient >50% of time was in face to face discussion of above.

## 2014-01-26 ENCOUNTER — Other Ambulatory Visit: Payer: Self-pay | Admitting: Internal Medicine

## 2014-02-01 ENCOUNTER — Encounter: Payer: Self-pay | Admitting: Obstetrics & Gynecology

## 2014-03-31 ENCOUNTER — Encounter: Payer: Self-pay | Admitting: Internal Medicine

## 2014-03-31 NOTE — Progress Notes (Signed)
Subjective:    Patient ID: Maria Mcpherson, female    DOB: June 01, 1956, 58 y.o.   MRN: [id number]  HPI  58 year old White Female in today for health maintenance exam and evaluation of medical issues. Patient has history of hypertension and hyperlipidemia. History of hypokalemia secondary to diuretic therapy. History of fibrous breast disease. History of prediabetes. History of dependent edema.  Past medical history: She had a pulmonary embolus in September 2006 after removal of a large ovarian fibroma which apparently got twisted with part of the ovarian vein resulting in a pulmonary embolus she was followed on Coumadin here for several months. She's never had a recurrence. She's only had a right oophorectomy. History of tonsillectomy and adenoidectomy in 09-10-1961.  Had colonoscopy April 2011 with 10 year followup advised. Bone density study done in Sep 10, 2009. Has had immunizations for hepatitis A, hepatitis B, MMR, typhoid due to foreign travel.  Prediabetes is treated with diet and exercise. Hemoglobin A1c in Sep 11, 2011 was 5.9%. Repeat study several months later was 5.7%.  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 Son passed away in 09-11-2007. One daughter. Patient and her husband have become foster parents to teenage boy girl through their church. Patient does not smoke or consume alcohol. She tries to walk for exercise.  Family history: Mother with history of breast cancer and hypertension. Father in good health. 2 brothers in good health. One sister in good health.       Review of Systems  Constitutional: Negative.   HENT: Negative.   Eyes: Negative.   Respiratory: Negative.   Cardiovascular: Negative.   Gastrointestinal: Negative.   Genitourinary:       Patient had no menstrual period for several months and then had 7 days of heavy bleeding. Will refer to GYN  Neurological: Negative.   Hematological: Negative.   Psychiatric/Behavioral: Negative.        Objective:   Physical Exam  Vitals reviewed. Constitutional: She is oriented to person, place, and time. She appears well-developed and well-nourished. No distress.  HENT:  Head: Normocephalic and atraumatic.  Right Ear: External ear normal.  Left Ear: External ear normal.  Mouth/Throat: No oropharyngeal exudate.  Eyes: Conjunctivae and EOM are normal. Pupils are equal, round, and reactive to light. Right eye exhibits no discharge. Left eye exhibits no discharge. No scleral icterus.  Neck: Neck supple. No JVD present. No thyromegaly present.  Cardiovascular: Normal rate, regular rhythm, normal heart sounds and intact distal pulses.   No murmur heard. Pulmonary/Chest: Effort normal and breath sounds normal. She has no wheezes.  Breasts normal female  Abdominal: Soft. Bowel sounds are normal. She exhibits no distension and no mass. There is no tenderness. There is no rebound and no guarding.  Genitourinary:  Defer to GYN  Musculoskeletal: Normal range of motion. She exhibits no edema.  Lymphadenopathy:    She has no cervical adenopathy.  Neurological: She is alert and oriented to person, place, and time. She has normal reflexes. No cranial nerve deficit. Coordination normal.  Skin: Skin is warm and dry. No rash noted. She is not diaphoretic.  Psychiatric: She has a normal mood and affect. Her behavior is normal. Judgment and thought content normal.          Assessment & Plan:  Abnormal vaginal bleeding-arrange GYN consultation  Hypertension-BP is elevated today. She is to monitor blood pressure and call if persistently elevated  Hyperlipidemia-stable  Prediabetes-hemoglobin A1c 5.6%  Dependent edema  Hypokalemia  secondary to diuretic therapy  Fibrocystic breast disease  Plan: Refer to GYN physician regarding abnormal vaginal bleeding. Continue same medications and return in 6 months. Lipid panel is within normal limits. Hemoglobin A1c 5.6%. Vitamin D is normal. TSH is  normal. FSH is 165.3

## 2014-04-06 ENCOUNTER — Other Ambulatory Visit: Payer: Self-pay | Admitting: Internal Medicine

## 2014-05-05 ENCOUNTER — Other Ambulatory Visit: Payer: Self-pay | Admitting: Internal Medicine

## 2014-05-26 ENCOUNTER — Encounter: Payer: Self-pay | Admitting: Internal Medicine

## 2014-07-10 ENCOUNTER — Other Ambulatory Visit: Payer: BC Managed Care – PPO | Admitting: Internal Medicine

## 2014-07-10 DIAGNOSIS — I10 Essential (primary) hypertension: Secondary | ICD-10-CM

## 2014-07-10 DIAGNOSIS — R7309 Other abnormal glucose: Secondary | ICD-10-CM

## 2014-07-10 DIAGNOSIS — E785 Hyperlipidemia, unspecified: Secondary | ICD-10-CM

## 2014-07-10 DIAGNOSIS — E876 Hypokalemia: Secondary | ICD-10-CM

## 2014-07-10 LAB — BASIC METABOLIC PANEL
BUN: 18 mg/dL (ref 6–23)
CO2: 32 mEq/L (ref 19–32)
Calcium: 9.5 mg/dL (ref 8.4–10.5)
Chloride: 100 mEq/L (ref 96–112)
Creat: 0.88 mg/dL (ref 0.50–1.10)
Glucose, Bld: 86 mg/dL (ref 70–99)
Potassium: 3.6 mEq/L (ref 3.5–5.3)
Sodium: 140 mEq/L (ref 135–145)

## 2014-07-10 LAB — LIPID PANEL
CHOLESTEROL: 199 mg/dL (ref 0–200)
HDL: 63 mg/dL (ref >39)
LDL Cholesterol: 108 mg/dL — ABNORMAL HIGH (ref 0–99)
Total CHOL/HDL Ratio: 3.2 Ratio
Triglycerides: 141 mg/dL (ref <150)
VLDL: 28 mg/dL (ref 0–40)

## 2014-07-10 LAB — HEPATIC FUNCTION PANEL
ALT: 19 U/L (ref 0–35)
AST: 21 U/L (ref 0–37)
Albumin: 3.9 g/dL (ref 3.5–5.2)
Alkaline Phosphatase: 52 U/L (ref 39–117)
BILIRUBIN INDIRECT: 0.6 mg/dL (ref 0.2–1.2)
Bilirubin, Direct: 0.1 mg/dL (ref 0.0–0.3)
TOTAL PROTEIN: 6.4 g/dL (ref 6.0–8.3)
Total Bilirubin: 0.7 mg/dL (ref 0.2–1.2)

## 2014-07-10 LAB — HEMOGLOBIN A1C
Hgb A1c MFr Bld: 5.8 % — ABNORMAL HIGH (ref <5.7)
MEAN PLASMA GLUCOSE: 120 mg/dL — AB (ref <117)

## 2014-07-11 ENCOUNTER — Ambulatory Visit (INDEPENDENT_AMBULATORY_CARE_PROVIDER_SITE_OTHER): Payer: BC Managed Care – PPO | Admitting: Internal Medicine

## 2014-07-11 ENCOUNTER — Encounter: Payer: Self-pay | Admitting: Internal Medicine

## 2014-07-11 VITALS — BP 130/80 | HR 75 | Temp 97.5°F | Wt 170.0 lb

## 2014-07-11 DIAGNOSIS — I10 Essential (primary) hypertension: Secondary | ICD-10-CM | POA: Diagnosis not present

## 2014-07-11 DIAGNOSIS — R7302 Impaired glucose tolerance (oral): Secondary | ICD-10-CM | POA: Diagnosis not present

## 2014-07-11 DIAGNOSIS — E876 Hypokalemia: Secondary | ICD-10-CM

## 2014-07-11 DIAGNOSIS — E669 Obesity, unspecified: Secondary | ICD-10-CM | POA: Diagnosis not present

## 2014-07-11 DIAGNOSIS — E8881 Metabolic syndrome: Secondary | ICD-10-CM

## 2014-07-11 DIAGNOSIS — E785 Hyperlipidemia, unspecified: Secondary | ICD-10-CM | POA: Diagnosis not present

## 2014-07-11 MED ORDER — HYDROCHLOROTHIAZIDE 25 MG PO TABS: ORAL_TABLET | ORAL | Status: DC

## 2014-07-11 MED ORDER — METOPROLOL TARTRATE 50 MG PO TABS: 50.0000 mg | ORAL_TABLET | Freq: Every day | ORAL | Status: DC

## 2014-07-11 MED ORDER — SIMVASTATIN 40 MG PO TABS: 40.0000 mg | ORAL_TABLET | Freq: Every day | ORAL | Status: DC

## 2014-07-11 MED ORDER — POTASSIUM CHLORIDE CRYS ER 20 MEQ PO TBCR: 20.0000 meq | EXTENDED_RELEASE_TABLET | Freq: Two times a day (BID) | ORAL | Status: DC

## 2014-10-20 NOTE — Progress Notes (Signed)
   Subjective:    Patient ID: Maria Mcpherson, female    DOB: [birth year], 59 y.o.   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 She takes potassium supplementation because of diuretic therapy. No new complaints or problems. Hasn't been exercising much due to work load. Counseled regarding diet and exercise and weight loss.    Review of Systems     Objective:   Physical Exam  Skin warm and dry. Nodes none. Neck is supple without JVD thyromegaly or carotid bruits. Chest clear to auscultation. Cardiac exam regular rate and rhythm. Extremities without edema      Assessment & Plan:  Hypertension-stable  Hyperlipidemia-stable  Hypokalemia secondary to diuretic therapy-stable  Impaired glucose tolerance-Hgb A1c 5.8%  Plan: Encouraged diet exercise and weight loss. Continue same medications and return in 6 months

## 2014-10-21 DIAGNOSIS — E669 Obesity, unspecified: Secondary | ICD-10-CM | POA: Insufficient documentation

## 2014-10-21 DIAGNOSIS — E876 Hypokalemia: Secondary | ICD-10-CM | POA: Insufficient documentation

## 2014-10-21 DIAGNOSIS — E8881 Metabolic syndrome: Secondary | ICD-10-CM | POA: Insufficient documentation

## 2014-10-21 DIAGNOSIS — R7302 Impaired glucose tolerance (oral): Secondary | ICD-10-CM | POA: Insufficient documentation

## 2014-10-21 NOTE — Patient Instructions (Signed)
Continue same medications and return in 6 months. Recommend diet exercise and weight loss

## 2015-01-15 ENCOUNTER — Other Ambulatory Visit: Payer: BLUE CROSS/BLUE SHIELD | Admitting: Internal Medicine

## 2015-01-15 DIAGNOSIS — Z0000 Encounter for general adult medical examination without abnormal findings: Secondary | ICD-10-CM

## 2015-01-15 DIAGNOSIS — Z13220 Encounter for screening for lipoid disorders: Secondary | ICD-10-CM

## 2015-01-15 DIAGNOSIS — Z1329 Encounter for screening for other suspected endocrine disorder: Secondary | ICD-10-CM

## 2015-01-15 DIAGNOSIS — Z1321 Encounter for screening for nutritional disorder: Secondary | ICD-10-CM

## 2015-01-15 DIAGNOSIS — R7309 Other abnormal glucose: Secondary | ICD-10-CM

## 2015-01-15 DIAGNOSIS — Z130 Encounter for screening for diseases of the blood and blood-forming organs and certain disorders involving the immune mechanism: Secondary | ICD-10-CM

## 2015-01-15 LAB — CBC WITH DIFFERENTIAL/PLATELET
BASOS PCT: 1 % (ref 0–1)
Basophils Absolute: 0 10*3/uL (ref 0.0–0.1)
EOS ABS: 0.2 10*3/uL (ref 0.0–0.7)
Eosinophils Relative: 4 % (ref 0–5)
HEMATOCRIT: 42.1 % (ref 36.0–46.0)
Hemoglobin: 14.4 g/dL (ref 12.0–15.0)
Lymphocytes Relative: 28 % (ref 12–46)
Lymphs Abs: 1.2 10*3/uL (ref 0.7–4.0)
MCH: 30.2 pg (ref 26.0–34.0)
MCHC: 34.2 g/dL (ref 30.0–36.0)
MCV: 88.3 fL (ref 78.0–100.0)
MPV: 10.6 fL (ref 8.6–12.4)
Monocytes Absolute: 0.4 10*3/uL (ref 0.1–1.0)
Monocytes Relative: 10 % (ref 3–12)
NEUTROS PCT: 57 % (ref 43–77)
Neutro Abs: 2.5 10*3/uL (ref 1.7–7.7)
PLATELETS: 220 10*3/uL (ref 150–400)
RBC: 4.77 MIL/uL (ref 3.87–5.11)
RDW: 14.2 % (ref 11.5–15.5)
WBC: 4.4 10*3/uL (ref 4.0–10.5)

## 2015-01-15 LAB — LIPID PANEL
CHOL/HDL RATIO: 3 ratio
Cholesterol: 186 mg/dL (ref 0–200)
HDL: 62 mg/dL (ref >=46)
LDL Cholesterol: 97 mg/dL (ref 0–99)
Triglycerides: 133 mg/dL (ref <150)
VLDL: 27 mg/dL (ref 0–40)

## 2015-01-15 LAB — COMPLETE METABOLIC PANEL WITH GFR
ALT: 18 U/L (ref 0–35)
AST: 18 U/L (ref 0–37)
Albumin: 3.9 g/dL (ref 3.5–5.2)
Alkaline Phosphatase: 60 U/L (ref 39–117)
BUN: 28 mg/dL — AB (ref 6–23)
CALCIUM: 9.5 mg/dL (ref 8.4–10.5)
CO2: 32 mEq/L (ref 19–32)
Chloride: 101 mEq/L (ref 96–112)
Creat: 0.86 mg/dL (ref 0.50–1.10)
GFR, Est African American: 86 mL/min
GFR, Est Non African American: 75 mL/min
Glucose, Bld: 91 mg/dL (ref 70–99)
POTASSIUM: 3.7 meq/L (ref 3.5–5.3)
Sodium: 142 mEq/L (ref 135–145)
Total Bilirubin: 0.6 mg/dL (ref 0.2–1.2)
Total Protein: 6.5 g/dL (ref 6.0–8.3)

## 2015-01-15 LAB — HEMOGLOBIN A1C
Hgb A1c MFr Bld: 6.1 % — ABNORMAL HIGH (ref <5.7)
MEAN PLASMA GLUCOSE: 128 mg/dL — AB (ref <117)

## 2015-01-15 LAB — TSH: TSH: 1.111 u[IU]/mL (ref 0.350–4.500)

## 2015-01-16 ENCOUNTER — Encounter: Payer: BC Managed Care – PPO | Admitting: Internal Medicine

## 2015-01-16 LAB — VITAMIN D 25 HYDROXY (VIT D DEFICIENCY, FRACTURES): Vit D, 25-Hydroxy: 33 ng/mL (ref 30–100)

## 2015-01-19 ENCOUNTER — Ambulatory Visit (INDEPENDENT_AMBULATORY_CARE_PROVIDER_SITE_OTHER): Payer: BLUE CROSS/BLUE SHIELD | Admitting: Internal Medicine

## 2015-01-19 ENCOUNTER — Encounter: Payer: Self-pay | Admitting: Internal Medicine

## 2015-01-19 VITALS — BP 124/82 | HR 68 | Temp 98.4°F | Ht 67.0 in | Wt 177.0 lb

## 2015-01-19 DIAGNOSIS — R7309 Other abnormal glucose: Secondary | ICD-10-CM

## 2015-01-19 DIAGNOSIS — R635 Abnormal weight gain: Secondary | ICD-10-CM

## 2015-01-19 DIAGNOSIS — D259 Leiomyoma of uterus, unspecified: Secondary | ICD-10-CM

## 2015-01-19 DIAGNOSIS — I10 Essential (primary) hypertension: Secondary | ICD-10-CM | POA: Diagnosis not present

## 2015-01-19 DIAGNOSIS — R7303 Prediabetes: Secondary | ICD-10-CM

## 2015-01-19 DIAGNOSIS — Z0000 Encounter for general adult medical examination without abnormal findings: Secondary | ICD-10-CM

## 2015-01-19 DIAGNOSIS — M25552 Pain in left hip: Secondary | ICD-10-CM | POA: Diagnosis not present

## 2015-01-19 DIAGNOSIS — E876 Hypokalemia: Secondary | ICD-10-CM

## 2015-01-19 DIAGNOSIS — E785 Hyperlipidemia, unspecified: Secondary | ICD-10-CM

## 2015-01-19 LAB — POCT URINALYSIS DIPSTICK
Bilirubin, UA: NEGATIVE
Blood, UA: NEGATIVE
Glucose, UA: NEGATIVE
Ketones, UA: NEGATIVE
Leukocytes, UA: NEGATIVE
Nitrite, UA: NEGATIVE
Protein, UA: NEGATIVE
Spec Grav, UA: 1.015
Urobilinogen, UA: NEGATIVE
pH, UA: 6

## 2015-01-19 MED ORDER — POTASSIUM CHLORIDE CRYS ER 20 MEQ PO TBCR: EXTENDED_RELEASE_TABLET | ORAL | Status: DC

## 2015-01-19 MED ORDER — SIMVASTATIN 40 MG PO TABS: 40.0000 mg | ORAL_TABLET | Freq: Every day | ORAL | Status: DC

## 2015-01-19 MED ORDER — METFORMIN HCL 500 MG PO TABS: ORAL_TABLET | ORAL | Status: DC

## 2015-01-19 NOTE — Progress Notes (Signed)
 Subjective:    Patient ID: Maria Mcpherson, female    DOB: 06/28/1956, 59 y.o.   MRN: [id number]  HPI  59 year old White Female for health maintenance and evaluation of medical problems.  Had basal cell carcinoma of left lateral nose removed recently with Moh's procedure. She has a history of essential hypertension and hyperlipidemia. History of hypokalemia secondary to diuretic therapy. I would like for her potassium to be 4.0 or better. It is under for and I'm going to increase her potassium supplement to 60 mEq daily. She will follow-up in 6 weeks. Also, hemoglobin A1c has increased from 5.8-6.1%. She will start metformin 500 mg daily and follow-up with repeat hemoglobin A1c and office visit in 6 weeks. She's gained 7 pounds since last physical examination. Not getting to exercise as much is she had previously. Left hip and leg hurt a great deal and are stiff frequently. She probably needs to see orthopedist as I suspect she has a hip problem. Getting out of a car, she has to stand still for a moment for she can proceed to walk. Hip has not given way with her and she has not fallen. Sometimes hip and leg pain keep her from sleeping well at night.  History of impaired glucose tolerance/prediabetes. History of dependent edema. History of fibrocystic breast disease.  Last year she had postmenopausal bleeding and saw Dr. Suzanne Miller. She had an endometrial biopsy that showed proliferative endometrium. She was found to have several fibroids. These are being watched conservatively and she's had no further episodes of postmenopausal bleeding. She had a Pap smear at that time.  History of pulmonary embolus September 2006 after removal of a large ovarian fibroma which apparently got twisted with part of the ovarian vein resulting in pulmonary embolus. She was followed on Coumadin here for several months. She's never had a recurrence. She only had right oophorectomy not a complete hysterectomy. History of  tonsillectomy and adenoidectomy 1963.  Had colonoscopy April 2011 with 10 year follow-up advised. Had bone density study 2011. 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 She is married. Son passed away in February 2009. Daughter is residing in [place] and has history of alcohol abuse and currently is in rehabilitation. Patient and her husband have become foster parents to a teenage boy and girl through their church. The boy graduated from high school and is entering basic training at [employer]. He plans to subsequently a 10 [employer] and major in engineering. The girl has been at [employer] this past year but may be depressed. Patient does not smoke or consume alcohol. Has tried to walk in the past for exercise but not getting much exercise at the present time due to work load and hip pain. Son passed away in February 2009.  Family history: Mother with history of breast cancer and hypertension. Maternal grandmother with history of stroke. Daughter with alcohol abuse. Father with history of leukemia. 2 brothers in good health. One sister in good health.  Prediabetes has been treated with diet and exercise. Have her hemoglobin A1c has increased from 5.8% to 6.1% with a 7 pound weight gain over the past year.         Review of Systems  HENT: Negative.   Eyes: Negative.   Respiratory: Negative.   Cardiovascular: Negative.     Gastrointestinal: Negative.   Musculoskeletal:       Left hip and leg pain. Leg is stiff.  Skin:       Recent removal of basal cell carcinoma of nose  Neurological: Negative.   Hematological: Negative.        Objective:   Physical Exam  Constitutional: She is oriented to person, place, and time. She appears well-developed and well-nourished. No distress.  HENT:  Head: Normocephalic and  atraumatic.  Right Ear: External ear normal.  Left Ear: External ear normal.  Mouth/Throat: Oropharynx is clear and moist. No oropharyngeal exudate.  Eyes: Conjunctivae are normal. Pupils are equal, round, and reactive to light. Right eye exhibits no discharge. Left eye exhibits no discharge. No scleral icterus.  Neck: Neck supple. No JVD present. No thyromegaly present.  Cardiovascular: Normal rate, regular rhythm, normal heart sounds and intact distal pulses.   No murmur heard. Pulmonary/Chest: Effort normal and breath sounds normal. She has no wheezes. She has no rales.  Abdominal: Soft. Bowel sounds are normal. She exhibits no distension and no mass. There is no tenderness. There is no rebound and no guarding.  Genitourinary:  Deferred to GYN  Musculoskeletal: Normal range of motion. She exhibits no edema.  Pain in left groin with internal and external rotation of left hip. Straight leg raising is negative at 90  Lymphadenopathy:    She has no cervical adenopathy.  Neurological: She is alert and oriented to person, place, and time. She has normal reflexes. No cranial nerve deficit. Coordination normal.  Skin: Skin is warm and dry. No rash noted. She is not diaphoretic.  Psychiatric: She has a normal mood and affect. Her behavior is normal. Judgment and thought content normal.  Vitals reviewed.         Assessment & Plan:  Left hip pain-suspect osteoarthritis. See orthopedist in the near future  Hypokalemia secondary to diarrhetic therapy. Potassium 3.7. Increase K dirt 20 mEq to 3 times daily for total of 60 mEq daily and recheck in 6 weeks  Prediabetes-hemoglobin A1c increased 6.1%. Start metformin 500 mg daily and return in 6 weeks for office visit hemoglobin A1c  Uterine fibroids-seen last year by Dr. Suzanne Miller.  Remote history of pulmonary embolus 2006 related to ovarian fibroma surgery with no further recurrence  Essential hypertension-stable on current  regimen  Hyperlipidemia-stable on statin therapy with normal lipid panel and liver functions  Weight gain-due to lack of exercise  Plan: See above return in 6 weeks for office visit, hemoglobin A1c, basic metabolic panel for follow-up of hypokalemia. Would like to have potassium 4.0. 

## 2015-01-19 NOTE — Patient Instructions (Signed)
To see orthopedist regarding left hip. Take metformin 500 mg once daily. Increase potassium supplement to 20 mEq 3 times daily. Return in 6 weeks for office visit, hemoglobin A1c and serum potassium.

## 2015-01-23 ENCOUNTER — Telehealth: Payer: Self-pay | Admitting: Internal Medicine

## 2015-01-23 NOTE — Telephone Encounter (Signed)
Patient notified of her appointment with [facility] on 7/11 @ 1:45 with [facility] [place], [place] for Dr. Ninfa Linden.  Patient given address and phone # of the practice.

## 2015-02-27 ENCOUNTER — Other Ambulatory Visit: Payer: BLUE CROSS/BLUE SHIELD | Admitting: Internal Medicine

## 2015-02-27 DIAGNOSIS — E119 Type 2 diabetes mellitus without complications: Secondary | ICD-10-CM

## 2015-02-27 DIAGNOSIS — E876 Hypokalemia: Secondary | ICD-10-CM

## 2015-02-27 LAB — HEMOGLOBIN A1C
Hgb A1c MFr Bld: 5.8 % — ABNORMAL HIGH (ref <5.7)
Mean Plasma Glucose: 120 mg/dL — ABNORMAL HIGH (ref <117)

## 2015-02-27 LAB — POTASSIUM: Potassium: 4.1 mmol/L (ref 3.5–5.3)

## 2015-03-02 ENCOUNTER — Ambulatory Visit (INDEPENDENT_AMBULATORY_CARE_PROVIDER_SITE_OTHER): Payer: BLUE CROSS/BLUE SHIELD | Admitting: Internal Medicine

## 2015-03-02 ENCOUNTER — Encounter: Payer: Self-pay | Admitting: Internal Medicine

## 2015-03-02 VITALS — BP 118/72 | HR 68 | Temp 98.1°F | Wt 174.5 lb

## 2015-03-02 DIAGNOSIS — I10 Essential (primary) hypertension: Secondary | ICD-10-CM | POA: Diagnosis not present

## 2015-03-02 DIAGNOSIS — E785 Hyperlipidemia, unspecified: Secondary | ICD-10-CM

## 2015-03-02 DIAGNOSIS — R7302 Impaired glucose tolerance (oral): Secondary | ICD-10-CM

## 2015-03-02 MED ORDER — METFORMIN HCL 500 MG PO TABS: ORAL_TABLET | ORAL | Status: DC

## 2015-04-23 ENCOUNTER — Encounter: Payer: Self-pay | Admitting: Internal Medicine

## 2015-04-23 NOTE — Progress Notes (Signed)
   Subjective:    Patient ID: Maria Mcpherson, female    DOB: May 25, 1956, 59 y.o.   MRN: [id number]  HPI Follow-up on essential hypertension, hyperlipidemia and impaired glucose tolerance. Family concerned about increase in hemoglobin A1c to 6.1%. Is now 5.8%. She is trying to get more exercise and watch her diet.    Review of Systems     Objective:   Physical Exam  Chest clear. Cardiac exam regular rate and rhythm. Extremities without edema. Skin warm and dry. Affect normal.      Assessment & Plan:  Impaired glucose tolerance  Essential hypertension  Hyperlipidemia  Plan: Continue diet and exercise efforts. Return late January for follow-up. At that time will need fasting lipid panel, liver functions, hemoglobin A1c, blood pressure check and office visit

## 2015-04-23 NOTE — Patient Instructions (Addendum)
Continue diet and exercise efforts. Was seen in June for physical exam. Hemoglobin at that time was 6.1%. Now 5.8%. Continue same medications. Return in late January for follow-up on hyperlipidemia, hypertension, and impaired glucose tolerance.

## 2015-07-08 ENCOUNTER — Other Ambulatory Visit: Payer: Self-pay | Admitting: Internal Medicine

## 2015-07-13 ENCOUNTER — Other Ambulatory Visit: Payer: Self-pay | Admitting: Internal Medicine

## 2015-07-18 ENCOUNTER — Other Ambulatory Visit: Payer: Self-pay | Admitting: Internal Medicine

## 2015-07-30 ENCOUNTER — Other Ambulatory Visit (HOSPITAL_COMMUNITY): Payer: Self-pay | Admitting: Orthopaedic Surgery

## 2015-08-11 ENCOUNTER — Other Ambulatory Visit (HOSPITAL_COMMUNITY): Payer: Self-pay | Admitting: Orthopaedic Surgery

## 2015-08-14 ENCOUNTER — Other Ambulatory Visit: Payer: Self-pay | Admitting: Internal Medicine

## 2015-08-18 ENCOUNTER — Other Ambulatory Visit (HOSPITAL_COMMUNITY): Payer: Self-pay | Admitting: Orthopaedic Surgery

## 2015-08-20 NOTE — Patient Instructions (Addendum)
YOUR PROCEDURE IS SCHEDULED ON :  08/28/15  REPORT TO  HOSPITAL MAIN ENTRANCE FOLLOW SIGNS TO EAST ELEVATOR - GO TO 3rd FLOOR CHECK IN AT 3 EAST NURSES STATION (SHORT STAY) AT:  10:30 AM  CALL THIS NUMBER IF YOU HAVE PROBLEMS THE MORNING OF SURGERY [phone]  REMEMBER:ONLY 1 PER PERSON MAY GO TO SHORT STAY WITH YOU TO GET READY THE MORNING OF YOUR SURGERY  DO NOT EAT FOOD OR DRINK LIQUIDS AFTER MIDNIGHT  MAY HAVE WATER UNTIL 6:30 AM  TAKE THESE MEDICINES THE MORNING OF SURGERY: CLARITIN / METOPROLOL / SIMVASTATIN  YOU MAY NOT HAVE ANY METAL ON YOUR BODY INCLUDING HAIR PINS AND PIERCING'S. DO NOT WEAR JEWELRY, MAKEUP, LOTIONS, POWDERS OR PERFUMES. DO NOT WEAR NAIL POLISH. DO NOT SHAVE 48 HRS PRIOR TO SURGERY. MEN MAY SHAVE FACE AND NECK.  DO NOT [place]. [facility] IS NOT RESPONSIBLE FOR VALUABLES.  CONTACTS, DENTURES OR PARTIALS MAY NOT BE WORN TO SURGERY. LEAVE SUITCASE IN CAR. CAN BE BROUGHT TO ROOM AFTER SURGERY.  PATIENTS DISCHARGED THE DAY OF SURGERY WILL NOT BE ALLOWED TO DRIVE HOME.  PLEASE READ OVER THE FOLLOWING INSTRUCTION SHEETS _________________________________________________________________________________                                          [facility] - PREPARING FOR SURGERY  Before surgery, you can play an important role.  Because skin is not sterile, your skin needs to be as free of germs as possible.  You can reduce the number of germs on your skin by washing with CHG (chlorahexidine gluconate) soap before surgery.  CHG is an antiseptic cleaner which kills germs and bonds with the skin to continue killing germs even after washing. Please DO NOT use if you have an allergy to CHG or antibacterial soaps.  If your skin becomes reddened/irritated stop using the CHG and inform your nurse when you arrive at Short Stay. Do not shave (including legs and underarms) for at least 48 hours prior to the first CHG shower.  You may  shave your face. Please follow these instructions carefully:   1.  Shower with CHG Soap the night before surgery and the  morning of Surgery.   2.  If you choose to wash your hair, wash your hair first as usual with your  normal  Shampoo.   3.  After you shampoo, rinse your hair and body thoroughly to remove the  shampoo.                                         4.  Use CHG as you would any other liquid soap.  You can apply chg directly  to the skin and wash . Gently wash with scrungie or clean wascloth    5.  Apply the CHG Soap to your body ONLY FROM THE NECK DOWN.   Do not use on open                           Wound or open sores. Avoid contact with eyes, ears mouth and genitals (private parts).  Genitals (private parts) with your normal soap.              6.  Wash thoroughly, paying special attention to the area where your surgery  will be performed.   7.  Thoroughly rinse your body with warm water from the neck down.   8.  DO NOT shower/wash with your normal soap after using and rinsing off  the CHG Soap .                9.  Pat yourself dry with a clean towel.             10.  Wear clean night clothes to bed after shower             11.  Place clean sheets on your bed the night of your first shower and do not  sleep with pets.  Day of Surgery : Do not apply any lotions/deodorants the morning of surgery.  Please wear clean clothes to the hospital/surgery center.  FAILURE TO FOLLOW THESE INSTRUCTIONS MAY RESULT IN THE CANCELLATION OF YOUR SURGERY    PATIENT SIGNATURE_________________________________  ______________________________________________________________________

## 2015-08-24 ENCOUNTER — Encounter (HOSPITAL_COMMUNITY): Payer: Self-pay

## 2015-08-24 ENCOUNTER — Encounter (HOSPITAL_COMMUNITY)
Admission: RE | Admit: 2015-08-24 | Discharge: 2015-08-24 | Disposition: A | Discharge status: Home / Self Care | Payer: 59 | Source: Ambulatory Visit | Attending: Orthopaedic Surgery | Admitting: Orthopaedic Surgery

## 2015-08-24 DIAGNOSIS — E119 Type 2 diabetes mellitus without complications: Secondary | ICD-10-CM | POA: Insufficient documentation

## 2015-08-24 DIAGNOSIS — Z01812 Encounter for preprocedural laboratory examination: Secondary | ICD-10-CM | POA: Insufficient documentation

## 2015-08-24 DIAGNOSIS — M1612 Unilateral primary osteoarthritis, left hip: Secondary | ICD-10-CM | POA: Insufficient documentation

## 2015-08-24 DIAGNOSIS — Z0183 Encounter for blood typing: Secondary | ICD-10-CM | POA: Diagnosis not present

## 2015-08-24 DIAGNOSIS — Z01818 Encounter for other preprocedural examination: Secondary | ICD-10-CM | POA: Diagnosis not present

## 2015-08-24 HISTORY — DX: Unspecified osteoarthritis, unspecified site: M19.90

## 2015-08-24 HISTORY — DX: Personal history of other malignant neoplasm of skin: Z85.828

## 2015-08-24 HISTORY — DX: Hyperlipidemia, unspecified: E78.5

## 2015-08-24 HISTORY — DX: Prediabetes: R73.03

## 2015-08-24 HISTORY — DX: Cardiac murmur, unspecified: R01.1

## 2015-08-24 LAB — BASIC METABOLIC PANEL
Anion gap: 8 (ref 5–15)
BUN: 26 mg/dL — AB (ref 6–20)
CALCIUM: 9.4 mg/dL (ref 8.9–10.3)
CO2: 31 mmol/L (ref 22–32)
CREATININE: 0.86 mg/dL (ref 0.44–1.00)
Chloride: 101 mmol/L (ref 101–111)
GFR calc non Af Amer: >60 mL/min (ref >60)
Glucose, Bld: 92 mg/dL (ref 65–99)
Potassium: 3.9 mmol/L (ref 3.5–5.1)
Sodium: 140 mmol/L (ref 135–145)

## 2015-08-24 LAB — ABO/RH: ABO/RH(D): A POS

## 2015-08-24 LAB — CBC
HCT: 38.1 % (ref 36.0–46.0)
Hemoglobin: 12.3 g/dL (ref 12.0–15.0)
MCH: 27.8 pg (ref 26.0–34.0)
MCHC: 32.3 g/dL (ref 30.0–36.0)
MCV: 86 fL (ref 78.0–100.0)
PLATELETS: 235 10*3/uL (ref 150–400)
RBC: 4.43 MIL/uL (ref 3.87–5.11)
RDW: 14 % (ref 11.5–15.5)
WBC: 4.7 10*3/uL (ref 4.0–10.5)

## 2015-08-24 LAB — SURGICAL PCR SCREEN
MRSA, PCR: NEGATIVE
Staphylococcus aureus: NEGATIVE

## 2015-08-25 LAB — HEMOGLOBIN A1C
Hgb A1c MFr Bld: 6 % — ABNORMAL HIGH (ref 4.8–5.6)
Mean Plasma Glucose: 126 mg/dL

## 2015-08-27 ENCOUNTER — Other Ambulatory Visit: Payer: 59 | Admitting: Internal Medicine

## 2015-08-27 ENCOUNTER — Encounter: Payer: Self-pay | Admitting: Internal Medicine

## 2015-08-27 ENCOUNTER — Ambulatory Visit (INDEPENDENT_AMBULATORY_CARE_PROVIDER_SITE_OTHER): Payer: 59 | Admitting: Internal Medicine

## 2015-08-27 VITALS — BP 128/84 | HR 75 | Temp 98.1°F | Resp 20 | Ht 67.0 in | Wt 182.0 lb

## 2015-08-27 DIAGNOSIS — I10 Essential (primary) hypertension: Secondary | ICD-10-CM

## 2015-08-27 DIAGNOSIS — E785 Hyperlipidemia, unspecified: Secondary | ICD-10-CM

## 2015-08-27 DIAGNOSIS — G4730 Sleep apnea, unspecified: Secondary | ICD-10-CM

## 2015-08-27 DIAGNOSIS — Z79899 Other long term (current) drug therapy: Secondary | ICD-10-CM | POA: Diagnosis not present

## 2015-08-27 DIAGNOSIS — R7302 Impaired glucose tolerance (oral): Secondary | ICD-10-CM

## 2015-08-27 DIAGNOSIS — M1612 Unilateral primary osteoarthritis, left hip: Secondary | ICD-10-CM | POA: Diagnosis not present

## 2015-08-27 DIAGNOSIS — E876 Hypokalemia: Secondary | ICD-10-CM | POA: Diagnosis not present

## 2015-08-27 DIAGNOSIS — Z5181 Encounter for therapeutic drug level monitoring: Secondary | ICD-10-CM

## 2015-08-27 DIAGNOSIS — E8881 Metabolic syndrome: Secondary | ICD-10-CM

## 2015-08-27 DIAGNOSIS — E669 Obesity, unspecified: Secondary | ICD-10-CM

## 2015-08-27 LAB — LIPID PANEL
Cholesterol: 231 mg/dL — ABNORMAL HIGH (ref 125–200)
HDL: 66 mg/dL (ref >=46)
LDL CALC: 134 mg/dL — AB (ref <130)
TRIGLYCERIDES: 153 mg/dL — AB (ref <150)
Total CHOL/HDL Ratio: 3.5 Ratio (ref <=5.0)
VLDL: 31 mg/dL — ABNORMAL HIGH (ref <30)

## 2015-08-27 LAB — HEPATIC FUNCTION PANEL
ALT: 24 U/L (ref 6–29)
AST: 24 U/L (ref 10–35)
Albumin: 3.8 g/dL (ref 3.6–5.1)
Alkaline Phosphatase: 61 U/L (ref 33–130)
BILIRUBIN INDIRECT: 0.5 mg/dL (ref 0.2–1.2)
Bilirubin, Direct: 0.1 mg/dL (ref <=0.2)
TOTAL PROTEIN: 6.3 g/dL (ref 6.1–8.1)
Total Bilirubin: 0.6 mg/dL (ref 0.2–1.2)

## 2015-08-27 NOTE — Addendum Note (Signed)
Addended by: Beryle Quant on: 08/27/2015 09:34 AM   Modules accepted: Orders

## 2015-08-28 ENCOUNTER — Inpatient Hospital Stay (HOSPITAL_COMMUNITY): Payer: 59

## 2015-08-28 ENCOUNTER — Inpatient Hospital Stay (HOSPITAL_COMMUNITY)
Admission: RE | Admit: 2015-08-28 | Discharge: 2015-08-29 | DRG: 470 | Disposition: A | Discharge status: Home Health | Payer: 59 | Source: Ambulatory Visit | Attending: Orthopaedic Surgery | Admitting: Orthopaedic Surgery

## 2015-08-28 ENCOUNTER — Encounter (HOSPITAL_COMMUNITY): Payer: Self-pay | Admitting: *Deleted

## 2015-08-28 ENCOUNTER — Inpatient Hospital Stay (HOSPITAL_COMMUNITY): Payer: 59 | Admitting: Anesthesiology

## 2015-08-28 ENCOUNTER — Encounter (HOSPITAL_COMMUNITY)
Admission: RE | Disposition: A | Discharge status: Home Health | Payer: Self-pay | Source: Ambulatory Visit | Attending: Orthopaedic Surgery

## 2015-08-28 DIAGNOSIS — I10 Essential (primary) hypertension: Secondary | ICD-10-CM | POA: Diagnosis present

## 2015-08-28 DIAGNOSIS — E119 Type 2 diabetes mellitus without complications: Secondary | ICD-10-CM | POA: Diagnosis present

## 2015-08-28 DIAGNOSIS — M1612 Unilateral primary osteoarthritis, left hip: Secondary | ICD-10-CM | POA: Diagnosis present

## 2015-08-28 DIAGNOSIS — Z79899 Other long term (current) drug therapy: Secondary | ICD-10-CM | POA: Diagnosis not present

## 2015-08-28 DIAGNOSIS — M25552 Pain in left hip: Secondary | ICD-10-CM | POA: Diagnosis present

## 2015-08-28 DIAGNOSIS — Z01812 Encounter for preprocedural laboratory examination: Secondary | ICD-10-CM

## 2015-08-28 DIAGNOSIS — Z86711 Personal history of pulmonary embolism: Secondary | ICD-10-CM

## 2015-08-28 DIAGNOSIS — E785 Hyperlipidemia, unspecified: Secondary | ICD-10-CM | POA: Diagnosis present

## 2015-08-28 DIAGNOSIS — Z96642 Presence of left artificial hip joint: Secondary | ICD-10-CM

## 2015-08-28 HISTORY — PX: TOTAL HIP ARTHROPLASTY: SHX124

## 2015-08-28 LAB — GLUCOSE, CAPILLARY
GLUCOSE-CAPILLARY: 143 mg/dL — AB (ref 65–99)
Glucose-Capillary: 94 mg/dL (ref 65–99)
Glucose-Capillary: 180 mg/dL — ABNORMAL HIGH (ref 65–99)

## 2015-08-28 LAB — TYPE AND SCREEN
ABO/RH(D): A POS
Antibody Screen: NEGATIVE

## 2015-08-28 SURGERY — ARTHROPLASTY, HIP, TOTAL, ANTERIOR APPROACH
Anesthesia: Monitor Anesthesia Care | Site: Hip | Laterality: Left

## 2015-08-28 MED ORDER — BUPIVACAINE IN DEXTROSE 0.75-8.25 % IT SOLN
INTRATHECAL | Status: DC | PRN
  Administered 2015-08-28: 2 mL via INTRATHECAL

## 2015-08-28 MED ORDER — LORATADINE 10 MG PO TABS
10.0000 mg | ORAL_TABLET | Freq: Every day | ORAL | Status: DC
  Administered 2015-08-29: 10 mg via ORAL
  Filled 2015-08-28: qty 1

## 2015-08-28 MED ORDER — INSULIN ASPART 100 UNIT/ML ~~LOC~~ SOLN
0.0000 [IU] | Freq: Three times a day (TID) | SUBCUTANEOUS | Status: DC
  Administered 2015-08-28: 2 [IU] via SUBCUTANEOUS

## 2015-08-28 MED ORDER — METOPROLOL TARTRATE 50 MG PO TABS
50.0000 mg | ORAL_TABLET | Freq: Two times a day (BID) | ORAL | Status: DC
  Administered 2015-08-29 (×2): 50 mg via ORAL
  Filled 2015-08-28 (×3): qty 1

## 2015-08-28 MED ORDER — PHENYLEPHRINE HCL 10 MG/ML IJ SOLN
INTRAMUSCULAR | Status: DC | PRN
  Administered 2015-08-28: 40 ug via INTRAVENOUS
  Administered 2015-08-28: 80 ug via INTRAVENOUS
  Administered 2015-08-28: 40 ug via INTRAVENOUS

## 2015-08-28 MED ORDER — METOCLOPRAMIDE HCL 5 MG/ML IJ SOLN: 5.0000 mg | Freq: Three times a day (TID) | INTRAMUSCULAR | Status: DC | PRN

## 2015-08-28 MED ORDER — FENTANYL CITRATE (PF) 100 MCG/2ML IJ SOLN
INTRAMUSCULAR | Status: AC
  Filled 2015-08-28: qty 2

## 2015-08-28 MED ORDER — MIDAZOLAM HCL 2 MG/2ML IJ SOLN
INTRAMUSCULAR | Status: AC
  Filled 2015-08-28: qty 2

## 2015-08-28 MED ORDER — PROPYLENE GLYCOL 0.6 % OP SOLN: 1.0000 [drp] | Freq: Three times a day (TID) | OPHTHALMIC | Status: DC | PRN

## 2015-08-28 MED ORDER — PROPOFOL 10 MG/ML IV BOLUS
INTRAVENOUS | Status: DC | PRN
Start: 2015-08-28 — End: 2015-08-28
  Administered 2015-08-28: 30 mg via INTRAVENOUS

## 2015-08-28 MED ORDER — PROPOFOL 500 MG/50ML IV EMUL
INTRAVENOUS | Status: DC | PRN
  Administered 2015-08-28: 100 ug/kg/min via INTRAVENOUS

## 2015-08-28 MED ORDER — PROMETHAZINE HCL 25 MG/ML IJ SOLN: 6.2500 mg | INTRAMUSCULAR | Status: DC | PRN

## 2015-08-28 MED ORDER — METHOCARBAMOL 500 MG PO TABS: 500.0000 mg | ORAL_TABLET | Freq: Four times a day (QID) | ORAL | Status: DC | PRN

## 2015-08-28 MED ORDER — SIMVASTATIN 40 MG PO TABS
40.0000 mg | ORAL_TABLET | Freq: Every morning | ORAL | Status: DC
  Administered 2015-08-29: 40 mg via ORAL
  Filled 2015-08-28: qty 1

## 2015-08-28 MED ORDER — CLINDAMYCIN PHOSPHATE 600 MG/50ML IV SOLN
600.0000 mg | Freq: Four times a day (QID) | INTRAVENOUS | Status: AC
  Administered 2015-08-28 – 2015-08-29 (×2): 600 mg via INTRAVENOUS
  Filled 2015-08-28 (×2): qty 50

## 2015-08-28 MED ORDER — POLYVINYL ALCOHOL 1.4 % OP SOLN
1.0000 [drp] | OPHTHALMIC | Status: DC | PRN
  Filled 2015-08-28: qty 15

## 2015-08-28 MED ORDER — PROPOFOL 10 MG/ML IV BOLUS
INTRAVENOUS | Status: AC
  Filled 2015-08-28: qty 20

## 2015-08-28 MED ORDER — ZOLPIDEM TARTRATE 5 MG PO TABS: 5.0000 mg | ORAL_TABLET | Freq: Every evening | ORAL | Status: DC | PRN

## 2015-08-28 MED ORDER — CALCIUM CARBONATE 1250 (500 CA) MG PO TABS
1.0000 | ORAL_TABLET | Freq: Two times a day (BID) | ORAL | Status: DC
  Administered 2015-08-29: 500 mg via ORAL
  Filled 2015-08-28 (×3): qty 1

## 2015-08-28 MED ORDER — OXYCODONE HCL 5 MG PO TABS
5.0000 mg | ORAL_TABLET | ORAL | Status: DC | PRN
  Administered 2015-08-28: 5 mg via ORAL
  Administered 2015-08-28 – 2015-08-29 (×3): 10 mg via ORAL
  Filled 2015-08-28: qty 2
  Filled 2015-08-28: qty 1
  Filled 2015-08-28 (×2): qty 2

## 2015-08-28 MED ORDER — LIDOCAINE HCL (CARDIAC) 20 MG/ML IV SOLN
INTRAVENOUS | Status: AC
  Filled 2015-08-28: qty 5

## 2015-08-28 MED ORDER — POLYETHYLENE GLYCOL 3350 17 G PO PACK: 17.0000 g | PACK | Freq: Every day | ORAL | Status: DC | PRN

## 2015-08-28 MED ORDER — CALCIUM CARBONATE 600 MG PO TABS: 600.0000 mg | ORAL_TABLET | Freq: Two times a day (BID) | ORAL | Status: DC

## 2015-08-28 MED ORDER — PHENYLEPHRINE 40 MCG/ML (10ML) SYRINGE FOR IV PUSH (FOR BLOOD PRESSURE SUPPORT)
PREFILLED_SYRINGE | INTRAVENOUS | Status: AC
  Filled 2015-08-28: qty 10

## 2015-08-28 MED ORDER — DIPHENHYDRAMINE HCL 12.5 MG/5ML PO ELIX: 12.5000 mg | ORAL_SOLUTION | ORAL | Status: DC | PRN

## 2015-08-28 MED ORDER — SODIUM CHLORIDE 0.9 % IV SOLN
INTRAVENOUS | Status: DC
  Administered 2015-08-28: 16:00:00 via INTRAVENOUS

## 2015-08-28 MED ORDER — PROPOFOL 10 MG/ML IV BOLUS
INTRAVENOUS | Status: AC
  Filled 2015-08-28: qty 40

## 2015-08-28 MED ORDER — ONDANSETRON HCL 4 MG PO TABS
4.0000 mg | ORAL_TABLET | Freq: Four times a day (QID) | ORAL | Status: DC | PRN
  Administered 2015-08-29: 4 mg via ORAL
  Filled 2015-08-28: qty 1

## 2015-08-28 MED ORDER — ASPIRIN EC 325 MG PO TBEC
325.0000 mg | DELAYED_RELEASE_TABLET | Freq: Two times a day (BID) | ORAL | Status: DC
  Filled 2015-08-28 (×2): qty 1

## 2015-08-28 MED ORDER — HYDROMORPHONE HCL 1 MG/ML IJ SOLN: 1.0000 mg | INTRAMUSCULAR | Status: DC | PRN

## 2015-08-28 MED ORDER — ONDANSETRON HCL 4 MG/2ML IJ SOLN
4.0000 mg | Freq: Four times a day (QID) | INTRAMUSCULAR | Status: DC | PRN
Start: 2015-08-28 — End: 2015-08-29

## 2015-08-28 MED ORDER — MENTHOL 3 MG MT LOZG: 1.0000 | LOZENGE | OROMUCOSAL | Status: DC | PRN

## 2015-08-28 MED ORDER — DOCUSATE SODIUM 100 MG PO CAPS
100.0000 mg | ORAL_CAPSULE | Freq: Two times a day (BID) | ORAL | Status: DC
  Administered 2015-08-29 (×2): 100 mg via ORAL

## 2015-08-28 MED ORDER — SODIUM CHLORIDE 0.9 % IR SOLN
Status: DC | PRN
  Administered 2015-08-28: 1000 mL

## 2015-08-28 MED ORDER — POTASSIUM CHLORIDE CRYS ER 20 MEQ PO TBCR
20.0000 meq | EXTENDED_RELEASE_TABLET | Freq: Three times a day (TID) | ORAL | Status: DC
  Administered 2015-08-29 (×2): 20 meq via ORAL
  Filled 2015-08-28 (×4): qty 1

## 2015-08-28 MED ORDER — METOCLOPRAMIDE HCL 10 MG PO TABS: 5.0000 mg | ORAL_TABLET | Freq: Three times a day (TID) | ORAL | Status: DC | PRN

## 2015-08-28 MED ORDER — ACETAMINOPHEN 325 MG PO TABS
650.0000 mg | ORAL_TABLET | Freq: Four times a day (QID) | ORAL | Status: DC | PRN
Start: 2015-08-28 — End: 2015-08-29

## 2015-08-28 MED ORDER — ONDANSETRON HCL 4 MG/2ML IJ SOLN
INTRAMUSCULAR | Status: AC
  Filled 2015-08-28: qty 2

## 2015-08-28 MED ORDER — ONDANSETRON HCL 4 MG/2ML IJ SOLN
INTRAMUSCULAR | Status: DC | PRN
  Administered 2015-08-28 (×2): 2 mg via INTRAVENOUS

## 2015-08-28 MED ORDER — METFORMIN HCL 500 MG PO TABS
500.0000 mg | ORAL_TABLET | Freq: Every day | ORAL | Status: DC
  Administered 2015-08-29: 500 mg via ORAL
  Filled 2015-08-28 (×2): qty 1

## 2015-08-28 MED ORDER — ACETAMINOPHEN 650 MG RE SUPP: 650.0000 mg | Freq: Four times a day (QID) | RECTAL | Status: DC | PRN

## 2015-08-28 MED ORDER — ALUM & MAG HYDROXIDE-SIMETH 200-200-20 MG/5ML PO SUSP: 30.0000 mL | ORAL | Status: DC | PRN

## 2015-08-28 MED ORDER — FENTANYL CITRATE (PF) 100 MCG/2ML IJ SOLN: 25.0000 ug | INTRAMUSCULAR | Status: DC | PRN

## 2015-08-28 MED ORDER — KETOROLAC TROMETHAMINE 15 MG/ML IJ SOLN
7.5000 mg | Freq: Four times a day (QID) | INTRAMUSCULAR | Status: DC
  Administered 2015-08-29 (×2): 7.5 mg via INTRAVENOUS
  Filled 2015-08-28 (×4): qty 1

## 2015-08-28 MED ORDER — ASPIRIN EC 325 MG PO TBEC
325.0000 mg | DELAYED_RELEASE_TABLET | Freq: Two times a day (BID) | ORAL | Status: DC
  Administered 2015-08-29: 325 mg via ORAL
  Filled 2015-08-28 (×3): qty 1

## 2015-08-28 MED ORDER — LACTATED RINGERS IV SOLN
INTRAVENOUS | Status: DC
  Administered 2015-08-28: 1000 mL via INTRAVENOUS
  Administered 2015-08-28: 14:00:00 via INTRAVENOUS

## 2015-08-28 MED ORDER — TRANEXAMIC ACID 1000 MG/10ML IV SOLN
1000.0000 mg | INTRAVENOUS | Status: AC
  Administered 2015-08-28: 1000 mg via INTRAVENOUS
  Filled 2015-08-28: qty 10

## 2015-08-28 MED ORDER — METHOCARBAMOL 1000 MG/10ML IJ SOLN
500.0000 mg | Freq: Four times a day (QID) | INTRAVENOUS | Status: DC | PRN
  Filled 2015-08-28: qty 5

## 2015-08-28 MED ORDER — CLINDAMYCIN PHOSPHATE 900 MG/50ML IV SOLN
INTRAVENOUS | Status: AC
  Filled 2015-08-28: qty 50

## 2015-08-28 MED ORDER — DEXAMETHASONE SODIUM PHOSPHATE 10 MG/ML IJ SOLN
INTRAMUSCULAR | Status: AC
  Filled 2015-08-28: qty 1

## 2015-08-28 MED ORDER — PHENOL 1.4 % MT LIQD: 1.0000 | OROMUCOSAL | Status: DC | PRN

## 2015-08-28 MED ORDER — HYDROCHLOROTHIAZIDE 25 MG PO TABS
25.0000 mg | ORAL_TABLET | Freq: Every day | ORAL | Status: DC
Start: 2015-08-29 — End: 2015-08-29
  Administered 2015-08-29: 25 mg via ORAL
  Filled 2015-08-28: qty 1

## 2015-08-28 MED ORDER — CLINDAMYCIN PHOSPHATE 900 MG/50ML IV SOLN
900.0000 mg | INTRAVENOUS | Status: AC
  Administered 2015-08-28: 900 mg via INTRAVENOUS

## 2015-08-28 MED ORDER — DEXAMETHASONE SODIUM PHOSPHATE 10 MG/ML IJ SOLN
INTRAMUSCULAR | Status: DC | PRN
  Administered 2015-08-28: 10 mg via INTRAVENOUS

## 2015-08-28 MED ORDER — MIDAZOLAM HCL 5 MG/5ML IJ SOLN
INTRAMUSCULAR | Status: DC | PRN
  Administered 2015-08-28: 2 mg via INTRAVENOUS

## 2015-08-28 MED ORDER — LIDOCAINE HCL (CARDIAC) 20 MG/ML IV SOLN
INTRAVENOUS | Status: DC | PRN
  Administered 2015-08-28: 40 mg via INTRAVENOUS

## 2015-08-28 SURGICAL SUPPLY — 35 items
BAG ZIPLOCK 12X15 (MISCELLANEOUS) IMPLANT
BENZOIN TINCTURE PRP APPL 2/3 (GAUZE/BANDAGES/DRESSINGS) ×3 IMPLANT
BLADE SAW SGTL 18X1.27X75 (BLADE) ×2 IMPLANT
BLADE SAW SGTL 18X1.27X75MM (BLADE) ×1
CAPT HIP TOTAL 2 ×3 IMPLANT
CELLS DAT CNTRL 66122 CELL SVR (MISCELLANEOUS) ×1 IMPLANT
CLOSURE WOUND 1/2 X4 (GAUZE/BANDAGES/DRESSINGS) ×1
CLOTH BEACON ORANGE TIMEOUT ST (SAFETY) ×3 IMPLANT
DRAPE STERI IOBAN 125X83 (DRAPES) ×3 IMPLANT
DRAPE U-SHAPE 47X51 STRL (DRAPES) ×6 IMPLANT
DRSG AQUACEL AG ADV 3.5X10 (GAUZE/BANDAGES/DRESSINGS) ×3 IMPLANT
DURAPREP 26ML APPLICATOR (WOUND CARE) ×3 IMPLANT
ELECT REM PT RETURN 9FT ADLT (ELECTROSURGICAL) ×3
ELECTRODE REM PT RTRN 9FT ADLT (ELECTROSURGICAL) ×1 IMPLANT
GAUZE XEROFORM 1X8 LF (GAUZE/BANDAGES/DRESSINGS) IMPLANT
GLOVE BIO SURGEON STRL SZ7.5 (GLOVE) ×3 IMPLANT
GLOVE BIOGEL PI IND STRL 8 (GLOVE) ×2 IMPLANT
GLOVE BIOGEL PI INDICATOR 8 (GLOVE) ×4
GLOVE ECLIPSE 8.0 STRL XLNG CF (GLOVE) ×3 IMPLANT
GOWN STRL REUS W/TWL XL LVL3 (GOWN DISPOSABLE) ×6 IMPLANT
HANDPIECE INTERPULSE COAX TIP (DISPOSABLE) ×2
HOLDER FOLEY CATH W/STRAP (MISCELLANEOUS) ×3 IMPLANT
PACK ANTERIOR HIP CUSTOM (KITS) ×3 IMPLANT
RTRCTR WOUND ALEXIS 18CM MED (MISCELLANEOUS) ×3
SET HNDPC FAN SPRY TIP SCT (DISPOSABLE) ×1 IMPLANT
STAPLER VISISTAT 35W (STAPLE) IMPLANT
STRIP CLOSURE SKIN 1/2X4 (GAUZE/BANDAGES/DRESSINGS) ×2 IMPLANT
SUT ETHIBOND NAB CT1 #1 30IN (SUTURE) ×3 IMPLANT
SUT MNCRL AB 4-0 PS2 18 (SUTURE) ×3 IMPLANT
SUT VIC AB 0 CT1 36 (SUTURE) ×3 IMPLANT
SUT VIC AB 1 CT1 36 (SUTURE) ×3 IMPLANT
SUT VIC AB 2-0 CT1 27 (SUTURE) ×4
SUT VIC AB 2-0 CT1 TAPERPNT 27 (SUTURE) ×2 IMPLANT
TRAY FOLEY W/METER SILVER 14FR (SET/KITS/TRAYS/PACK) ×3 IMPLANT
TRAY FOLEY W/METER SILVER 16FR (SET/KITS/TRAYS/PACK) IMPLANT

## 2015-08-28 NOTE — H&P (Signed)
TOTAL HIP ADMISSION H&P  Patient is admitted for left total hip arthroplasty.  Subjective:  Chief Complaint: left hip pain  HPI: Maria Mcpherson, 60 y.o. female, has a history of pain and functional disability in the left hip(s) due to arthritis and patient has failed non-surgical conservative treatments for greater than 12 weeks to include NSAID's and/or analgesics, corticosteriod injections, flexibility and strengthening excercises, weight reduction as appropriate and activity modification.  Onset of symptoms was gradual starting 2 years ago with rapidlly worsening course since that time.The patient noted no past surgery on the left hip(s).  Patient currently rates pain in the left hip at 10 out of 10 with activity. Patient has night pain, worsening of pain with activity and weight bearing, pain that interfers with activities of daily living, pain with passive range of motion and crepitus. Patient has evidence of subchondral cysts, subchondral sclerosis, periarticular osteophytes and joint space narrowing by imaging studies. This condition presents safety issues increasing the risk of falls.  There is no current active infection.  Patient Active Problem List   Diagnosis Date Noted  . Osteoarthritis of left hip 08/28/2015  . Impaired glucose tolerance 10/21/2014  . Hypokalemia 10/21/2014  . Obesity 10/21/2014  . Metabolic syndrome [id number]  . Dependent edema 10/03/2012  . Fibrocystic breast disease 10/22/2011  . Other and unspecified hyperlipidemia 01/10/2011  . Essential hypertension, benign 01/10/2011   Past Medical History  Diagnosis Date  . Fibrocystic breast disease   . HTN (hypertension)   . Hypokalemia   . pulmonary emboli 10/06    same time as oophrectomy/myomectomy, evlauation was negative  . Heart murmur     very slight  . Hyperlipidemia   . Arthritis   . History of nonmelanoma skin cancer   . Borderline diabetes     Past Surgical History  Procedure Laterality Date   . Tonsillectomy and adenoidectomy  1963  . Oophorectomy  10/06    only had one ovary removed unsure of rt or left    No prescriptions prior to admission   Allergies  Allergen Reactions  . Ampicillin Rash    Has patient had a PCN reaction causing immediate rash, facial/tongue/throat swelling, SOB or lightheadedness with hypotension: Unsure Has patient had a PCN reaction causing severe rash involving mucus membranes or skin necrosis: No Has patient had a PCN reaction that required hospitalization No Has patient had a PCN reaction occurring within the last 10 years: No If all of the above answers are "NO", then may proceed with Cephalosporin use.    Social History  Substance Use Topics  . Smoking status: Never Smoker   . Smokeless tobacco: Never Used  . Alcohol Use: Yes     Comment: occasional    Family History  Problem Relation Age of Onset  . Breast cancer Mother 63    mid 87s  . Leukemia Father   . Hypertension Mother   . Stroke Maternal Grandfather      Review of Systems  Musculoskeletal: Positive for joint pain.  All other systems reviewed and are negative.   Objective:  Physical Exam  Constitutional: She is oriented to person, place, and time. She appears well-developed and well-nourished.  HENT:  Head: Normocephalic and atraumatic.  Eyes: EOM are normal. Pupils are equal, round, and reactive to light.  Neck: Normal range of motion. Neck supple.  Cardiovascular: Normal rate and regular rhythm.   Respiratory: Effort normal and breath sounds normal.  GI: Soft. Bowel sounds are normal.  Musculoskeletal:  Left hip: She exhibits decreased range of motion, decreased strength, tenderness and bony tenderness.  Neurological: She is alert and oriented to person, place, and time.  Skin: Skin is warm and dry.  Psychiatric: She has a normal mood and affect.    Vital signs in last 24 hours: Temp:  [98.1 F (36.7 C)] 98.1 F (36.7 C) (02/02 1116) Pulse Rate:  [75]  75 (02/02 1116) Resp:  [20] 20 (02/02 1116) BP: (128)/(84) 128/84 mmHg (02/02 1116) SpO2:  [95 %] 95 % (02/02 1116) Weight:  [82.555 kg (182 lb)] 82.555 kg (182 lb) (02/02 1116)  Labs:   Estimated body mass index is 27.32 kg/(m^2) as calculated from the following:   Height as of 01/19/15: 5\' 7"  (1.702 m).   Weight as of 03/02/15: 79.153 kg (174 lb 8 oz).   Imaging Review Plain radiographs demonstrate severe degenerative joint disease of the left hip(s). The bone quality appears to be excellent for age and reported activity level.  Assessment/Plan:  End stage arthritis, left hip(s)  The patient history, physical examination, clinical judgement of the provider and imaging studies are consistent with end stage degenerative joint disease of the left hip(s) and total hip arthroplasty is deemed medically necessary. The treatment options including medical management, injection therapy, arthroscopy and arthroplasty were discussed at length. The risks and benefits of total hip arthroplasty were presented and reviewed. The risks due to aseptic loosening, infection, stiffness, dislocation/subluxation,  thromboembolic complications and other imponderables were discussed.  The patient acknowledged the explanation, agreed to proceed with the plan and consent was signed. Patient is being admitted for inpatient treatment for surgery, pain control, PT, OT, prophylactic antibiotics, VTE prophylaxis, progressive ambulation and ADL's and discharge planning.The patient is planning to be discharged home with home health services

## 2015-08-28 NOTE — Transfer of Care (Signed)
Immediate Anesthesia Transfer of Care Note  Patient: Maria Mcpherson  Procedure(s) Performed: Procedure(s): LEFT TOTAL HIP ARTHROPLASTY ANTERIOR APPROACH (Left)  Patient Location: PACU  Anesthesia Type:Spinal  Level of Consciousness:  sedated, patient cooperative and responds to stimulation  Airway & Oxygen Therapy:Patient Spontanous Breathing and Patient connected to face mask oxgen  Post-op Assessment:  Report given to PACU RN and Post -op Vital signs reviewed and stable  Post vital signs:  Reviewed and stable  Last Vitals:  Filed Vitals:   08/28/15 1036  BP: 154/104  Pulse: 62  Temp: 37.1 C  Resp: 18    Complications: No apparent anesthesia complications

## 2015-08-28 NOTE — Anesthesia Procedure Notes (Signed)
Spinal Patient location during procedure: OR Start time: 08/28/2015 1:12 PM End time: 08/28/2015 1:13 PM Reason for block: at surgeon's request Staffing Resident/CRNA: Freddie Breech Performed by: resident/CRNA  Preanesthetic Checklist Completed: patient identified, site marked, surgical consent, pre-op evaluation, timeout performed, IV checked, risks and benefits discussed, monitors and equipment checked and at surgeon's request Spinal Block Patient position: sitting Prep: ChloraPrep Patient monitoring: heart rate, continuous pulse ox and blood pressure Approach: midline Location: L3-4 Injection technique: single-shot Needle Needle type: Sprotte  Needle gauge: 24 G Needle length: 10 cm Assessment Sensory level: T6 Additional Notes Anesthesiologist at bedside.  Expiration date of kit checked and confirmed. Patient tolerated procedure well, without complications. X 1 attempt with noted clear CSF return. Loss of motor and sensory on exam post injection.

## 2015-08-28 NOTE — Anesthesia Postprocedure Evaluation (Signed)
Anesthesia Post Note  Patient: Maria Mcpherson  Procedure(s) Performed: Procedure(s) (LRB): LEFT TOTAL HIP ARTHROPLASTY ANTERIOR APPROACH (Left)  Patient location during evaluation: PACU Anesthesia Type: Spinal Level of consciousness: oriented and awake and alert Pain management: pain level controlled Vital Signs Assessment: post-procedure vital signs reviewed and stable Respiratory status: spontaneous breathing, respiratory function stable and patient connected to nasal cannula oxygen Cardiovascular status: blood pressure returned to baseline and stable Postop Assessment: no headache, no backache, patient able to bend at knees, no signs of nausea or vomiting and spinal receding Anesthetic complications: no    Last Vitals:  Filed Vitals:   08/28/15 1545 08/28/15 1557  BP: 136/68 134/74  Pulse: 56 54  Temp: 36.4 C 36.4 C  Resp: 13 14    Last Pain:  Filed Vitals:   08/28/15 1644  PainSc: 2                  Catalina Gravel

## 2015-08-28 NOTE — Op Note (Signed)
Maria Mcpherson, Maria Mcpherson             ACCOUNT NO.:  [id number]  MEDICAL RECORD NO.:  [id number]  LOCATION:  [id number]                         FACILITY:  [facility]  PHYSICIAN:  Lind Guest. Ninfa Linden, M.D.DATE OF BIRTH:  17-May-1956  DATE OF PROCEDURE:  08/28/2015 DATE OF DISCHARGE:                              OPERATIVE REPORT   PREOPERATIVE DIAGNOSES:  Severe end-stage arthritis and degenerative joint disease with primary osteoarthritis of left hip.  POSTOPERATIVE DIAGNOSES:  Severe end-stage arthritis and degenerative joint disease with primary osteoarthritis of left hip.  PROCEDURE:  Left total hip arthroplasty through direct anterior approach.  IMPLANTS:  DePuy Sector Gription acetabular component size 52, size 32 +0, neutral polyethylene liner, size 11 Corail femoral component with standard offset, size 36 +1.5 ceramic hip ball.  SURGEON:  Lind Guest. Ninfa Linden, M.D.  ASSISTANT:  Erskine Emery, PA-C.  ANESTHESIA:  Spinal.  ANTIBIOTICS:  900 mg of IV clindamycin.  BLOOD LOSS:  150 mL.  COMPLICATIONS:  None.  INDICATIONS:  Maria Mcpherson is a very pleasant 60 year old female with debilitating arthritis involving her left hip.  She has tried and failed all forms of conservative treatment.  Her x-ray showed complete loss of the superior lateral joint space and severe sclerotic changes and periarticular osteophytes.  Her pain is daily.  It has detrimentally affected her activities of daily living, her quality of life, and her mobility.  She wishes to proceed with a total hip arthroplasty through direct anterior approach.  She understands the risk of acute blood loss anemia, nerve and vessel injury, fracture, infection, dislocation, DVT. She understands our goals are decreased pain, improved mobility, and overall improved quality of life.  PROCEDURE DESCRIPTION:  After informed consent was obtained, appropriate left hip was marked.  She was brought to the operating room, and  spinal anesthesia was obtained while she was on her stretcher.  A Foley catheter was placed and then traction boots were placed on both her feet.  Next, she was placed supine on the Hana fracture table with the perineal post in place and both legs in inline skeletal traction devices, but no traction applied.  Her left operative hip was prepped and draped with DuraPrep and sterile drapes.  Time-out was called and she was identified as correct patient and correct left hip.  We then made an incision inferior and posterior to the anterior superior iliac spine and carried this obliquely down the leg.  We dissected down the tensor fascia lata muscle.  Tensor fascia was then divided longitudinally, so we could proceed with a direct anterior approach to the hip.  We identified and cauterized the lateral femoral circumflex vessels and identified the hip capsule.  I opened up the hip capsule finding a large joint effusion.  We placed Cobra retractors within the hip capsule and then made our femoral neck cut with an oscillating saw just proximal to the lesser trochanter.  We completed this on osteotome. I placed a corkscrew guide in the femoral head and removed femoral head in its entirety.  We found it to be devoid of cartilage.  We then released the transverse acetabular ligament and cleaned the acetabulum of debris including the labrum.  I placed a bent  Hohmann over the medial acetabular rim.  We then began reaming from a size 42 reamer in 2 mm increments up to a size of 51 with all reamers under direct visualization and the last reamer under direct fluoroscopy, so we could obtain our depth of reaming, our inclination, and anteversion.  Once we were pleased with this, we placed the real DePuy Sector Gription acetabular component size 52 and a 36 +0 neutral polyethylene liner for that size acetabular component.  Attention was then turned to the femur. With the leg externally rotated to 100  degrees, extended and adducted, we were able to release the lateral joint capsule and placed a Mueller retractor medially and a Hohmann retractor behind the greater trochanter.  We used a box cutting osteotome the inner femoral canal and a rongeur to lateralize.  I then began broaching from a size 8 broach using the Corail broaching system up to a size 11.  With the size 11, having a tight fit, we trialed a standard offset femoral neck and a 36 +1.5 hip ball.  We brought the leg back over and up with traction, internal rotation, reducing the pelvis and we were pleased with stability, leg length, offset and range of motion.  We then dislocated the hip and removed the trial components.  We placed the real Corail femoral component size 11 with standard offset, the real 36 +0 ceramic hip ball.  We reduced this in the pelvis.  Again, I was pleased with leg length, offset, and stability.  We then irrigated the soft tissue with normal saline solution using pulsatile lavage.  We were able to close the joint capsule with interrupted #1 Ethibond suture followed by running #1 Vicryl in the tensor fascia, 0 Vicryl in the deep tissue, 2-0 Vicryl in subcutaneous tissue, 4-0 Monocryl subcuticular stitch, and Steri-Strips on the skin.  An Aquacel plus dressing was applied.  She was then taken off the Hana table, and taken to recovery room in stable condition.  All final counts were correct.  There were no complications noted.  Of note, Jaci Standard is assisted in the entire case.  His assistance was crucial for facilitating all aspects of this case.     Lind Guest. Ninfa Linden, M.D.     CYB/MEDQ  D:  08/28/2015  T:  08/28/2015  Job:  [id number]

## 2015-08-28 NOTE — Brief Op Note (Signed)
08/28/2015  2:14 PM  PATIENT:  Maria Mcpherson  60 y.o. female  PRE-OPERATIVE DIAGNOSIS:  osteoarthritis left hip  POST-OPERATIVE DIAGNOSIS:  osteoarthritis left hip  PROCEDURE:  Procedure(s): LEFT TOTAL HIP ARTHROPLASTY ANTERIOR APPROACH (Left)  SURGEON:  Surgeon(s) and Role:    * Mcarthur Rossetti, MD - Primary  PHYSICIAN ASSISTANT: Benita Stabile, PA-C  ANESTHESIA:   spinal  EBL:  Total I/O In: 1000 [I.V.:1000] Out: 400 [Urine:250; Blood:150]  COUNTS:  YES  TOURNIQUET:  * No tourniquets in log *  DICTATION: .Other Dictation: Dictation Number [id number]  PLAN OF CARE: Admit to inpatient   PATIENT DISPOSITION:  PACU - hemodynamically stable.   Delay start of Pharmacological VTE agent (>24hrs) due to surgical blood loss or risk of bleeding: no

## 2015-08-28 NOTE — Anesthesia Preprocedure Evaluation (Addendum)
Anesthesia Evaluation  Patient identified by MRN, date of birth, ID band Patient awake    Reviewed: Allergy & Precautions, NPO status , Patient's Chart, lab work & pertinent test results, reviewed documented beta blocker date and time   History of Anesthesia Complications Negative for: history of anesthetic complications  Airway Mallampati: II  TM Distance: >3 FB Neck ROM: Full    Dental  (+) Teeth Intact, Dental Advisory Given   Pulmonary PE (2006)   Pulmonary exam normal breath sounds clear to auscultation       Cardiovascular hypertension, Pt. on medications and Pt. on home beta blockers Normal cardiovascular exam Rhythm:Regular Rate:Normal     Neuro/Psych negative neurological ROS  negative psych ROS   GI/Hepatic negative GI ROS, Neg liver ROS,   Endo/Other  diabetes, Oral Hypoglycemic Agents  Renal/GU negative Renal ROS     Musculoskeletal  (+) Arthritis , Osteoarthritis,    Abdominal   Peds  Hematology negative hematology ROS (+) Plt 235k   Anesthesia Other Findings Day of surgery medications reviewed with the patient.  Reproductive/Obstetrics                           Anesthesia Physical Anesthesia Plan  ASA: II  Anesthesia Plan: Spinal and MAC   Post-op Pain Management:    Induction: Intravenous  Airway Management Planned: Nasal Cannula  Additional Equipment: None  Intra-op Plan:   Post-operative Plan:   Informed Consent: I have reviewed the patients History and Physical, chart, labs and discussed the procedure including the risks, benefits and alternatives for the proposed anesthesia with the patient or authorized representative who has indicated his/her understanding and acceptance.   Dental advisory given  Plan Discussed with: CRNA, Anesthesiologist and Surgeon  Anesthesia Plan Comments: (Discussed risks and benefits of and differences between spinal and general.  Discussed risks of spinal including headache, backache, failure, bleeding, infection, and nerve damage. Patient consents to spinal. Questions answered. Coagulation studies and platelet count acceptable.)        Anesthesia Quick Evaluation

## 2015-08-29 LAB — BASIC METABOLIC PANEL
Anion gap: 8 (ref 5–15)
BUN: 23 mg/dL — AB (ref 6–20)
CO2: 25 mmol/L (ref 22–32)
CREATININE: 1.04 mg/dL — AB (ref 0.44–1.00)
Calcium: 8.2 mg/dL — ABNORMAL LOW (ref 8.9–10.3)
Chloride: 104 mmol/L (ref 101–111)
GFR calc Af Amer: >60 mL/min (ref >60)
GFR, EST NON AFRICAN AMERICAN: 58 mL/min — AB (ref >60)
GLUCOSE: 154 mg/dL — AB (ref 65–99)
POTASSIUM: 4.7 mmol/L (ref 3.5–5.1)
Sodium: 137 mmol/L (ref 135–145)

## 2015-08-29 LAB — CBC
HCT: 32.7 % — ABNORMAL LOW (ref 36.0–46.0)
Hemoglobin: 10.5 g/dL — ABNORMAL LOW (ref 12.0–15.0)
MCH: 28.2 pg (ref 26.0–34.0)
MCHC: 32.1 g/dL (ref 30.0–36.0)
MCV: 87.7 fL (ref 78.0–100.0)
PLATELETS: 228 10*3/uL (ref 150–400)
RBC: 3.73 MIL/uL — AB (ref 3.87–5.11)
RDW: 14.1 % (ref 11.5–15.5)
WBC: 8.6 10*3/uL (ref 4.0–10.5)

## 2015-08-29 LAB — GLUCOSE, CAPILLARY
GLUCOSE-CAPILLARY: 108 mg/dL — AB (ref 65–99)
GLUCOSE-CAPILLARY: 94 mg/dL (ref 65–99)

## 2015-08-29 MED ORDER — HYDROCODONE-ACETAMINOPHEN 5-325 MG PO TABS: 1.0000 | ORAL_TABLET | Freq: Four times a day (QID) | ORAL | Status: DC | PRN

## 2015-08-29 MED ORDER — ASPIRIN 325 MG PO TBEC: 325.0000 mg | DELAYED_RELEASE_TABLET | Freq: Two times a day (BID) | ORAL | Status: DC

## 2015-08-29 MED ORDER — METHOCARBAMOL 500 MG PO TABS: 500.0000 mg | ORAL_TABLET | Freq: Four times a day (QID) | ORAL | Status: DC | PRN

## 2015-08-29 NOTE — Discharge Instructions (Signed)

## 2015-08-29 NOTE — Discharge Summary (Signed)
Patient ID: Maria Mcpherson MRN: [id number] DOB/AGE: May 30, 1956 60 y.o.  Admit date: 08/28/2015 Discharge date: 08/29/2015  Admission Diagnoses:  Principal Problem:   Osteoarthritis of left hip Active Problems:   Status post total replacement of left hip   Discharge Diagnoses:  Same  Past Medical History  Diagnosis Date  . Fibrocystic breast disease   . HTN (hypertension)   . Hypokalemia   . pulmonary emboli 10/06    same time as oophrectomy/myomectomy, evlauation was negative  . Heart murmur     very slight  . Hyperlipidemia   . Arthritis   . History of nonmelanoma skin cancer   . Borderline diabetes     Surgeries: Procedure(s): LEFT TOTAL HIP ARTHROPLASTY ANTERIOR APPROACH on 08/28/2015   Consultants:    Discharged Condition: Improved  Hospital Course: Maria Mcpherson is an 59 y.o. female who was admitted 08/28/2015 for operative treatment ofOsteoarthritis of left hip. Patient has severe unremitting pain that affects sleep, daily activities, and work/hobbies. After pre-op clearance the patient was taken to the operating room on 08/28/2015 and underwent  Procedure(s): LEFT TOTAL HIP ARTHROPLASTY ANTERIOR APPROACH.    Patient was given perioperative antibiotics: Anti-infectives    Start     Dose/Rate Route Frequency Ordered Stop   08/28/15 2000  clindamycin (CLEOCIN) IVPB 600 mg     600 mg 100 mL/hr over 30 Minutes Intravenous Every 6 hours 08/28/15 1611 08/29/15 0257   08/28/15 1044  clindamycin (CLEOCIN) IVPB 900 mg     900 mg 100 mL/hr over 30 Minutes Intravenous On call to O.R. 08/28/15 1044 08/28/15 1347       Patient was given sequential compression devices, early ambulation, and chemoprophylaxis to prevent DVT.  Patient benefited maximally from hospital stay and there were no complications.    Recent vital signs: Patient Vitals for the past 24 hrs:  BP Temp Temp src Pulse Resp SpO2 Height Weight  08/29/15 1325 128/69 mmHg 98.5 F (36.9 C) Oral 63 16 99 % - -   08/29/15 1040 123/79 mmHg - - 65 - - - -  08/29/15 1000 121/77 mmHg 98.4 F (36.9 C) Oral 64 17 99 % - -  08/29/15 0730 - - - - 17 - - -  08/29/15 0300 111/66 mmHg 97.3 F (36.3 C) Oral 64 16 97 % - -  08/29/15 0154 112/77 mmHg 97.8 F (36.6 C) Oral 64 16 97 % - -  08/28/15 2100 127/85 mmHg 98.4 F (36.9 C) Oral 73 18 98 % - -  08/28/15 1902 137/86 mmHg 97.9 F (36.6 C) Oral 65 14 100 % - -  08/28/15 1800 140/88 mmHg 98 F (36.7 C) Oral 60 14 100 % - -  08/28/15 1655 (!) 144/92 mmHg 98.1 F (36.7 C) Oral (!) 57 14 100 % - -  08/28/15 1557 134/74 mmHg 97.6 F (36.4 C) - (!) 54 14 100 % 5\' 7"  (1.702 m) 82 kg (180 lb 12.4 oz)  08/28/15 1545 136/68 mmHg 97.5 F (36.4 C) - (!) 56 13 100 % - -  08/28/15 1530 133/70 mmHg - - (!) 51 11 100 % - -  08/28/15 1515 125/89 mmHg - - (!) 51 10 100 % - -  08/28/15 1500 120/75 mmHg - - (!) 56 16 98 % - -  08/28/15 1445 100/64 mmHg - - 63 16 100 % - -  08/28/15 1435 93/62 mmHg 97.6 F (36.4 C) - 73 17 100 % - -     Recent laboratory  studies:  Recent Labs  08/29/15 0453  WBC 8.6  HGB 10.5*  HCT 32.7*  PLT 228  NA 137  K 4.7  CL 104  CO2 25  BUN 23*  CREATININE 1.04*  GLUCOSE 154*  CALCIUM 8.2*     Discharge Medications:     Medication List    TAKE these medications        aspirin 325 MG EC tablet  Take 1 tablet (325 mg total) by mouth 2 (two) times daily after a meal.     calcium carbonate 600 MG Tabs tablet  Commonly known as:  OS-CAL  Take 600 mg by mouth 2 (two) times daily with a meal.     fish oil-omega-3 fatty acids 1000 MG capsule  Take 1 g by mouth daily.     hydrochlorothiazide 25 MG tablet  Commonly known as:  HYDRODIURIL  TAKE 1 TABLET (25 MG TOTAL) BY MOUTH DAILY.     HYDROcodone-acetaminophen 5-325 MG tablet  Commonly known as:  NORCO  Take 1 tablet by mouth every 6 (six) hours as needed for moderate pain.     JUICE PLUS FIBRE PO  Take 1 tablet by mouth daily.     KLOR-CON M20 20 MEQ tablet   Generic drug:  potassium chloride SA  TAKE ONE TABLET BY MOUTH THREE TIMES A DAY     loratadine 10 MG tablet  Commonly known as:  CLARITIN  Take 10 mg by mouth daily.     metFORMIN 500 MG tablet  Commonly known as:  GLUCOPHAGE  TAKE 1 TABLET BY MOUTH EVERYDAY WITH A MEAL     methocarbamol 500 MG tablet  Commonly known as:  ROBAXIN  Take 1 tablet (500 mg total) by mouth every 6 (six) hours as needed for muscle spasms.     metoprolol 50 MG tablet  Commonly known as:  LOPRESSOR  TAKE 1 TABLET (50 MG TOTAL) BY MOUTH DAILY.     simvastatin 40 MG tablet  Commonly known as:  ZOCOR  Take 1 tablet (40 mg total) by mouth at bedtime.     SYSTANE BALANCE 0.6 % Soln  Generic drug:  Propylene Glycol  Apply 1 drop to eye 3 (three) times daily as needed (Dry eyes).        Diagnostic Studies: Dg C-arm 1-60 Min-no Report  08/28/2015  CLINICAL DATA: hip C-ARM 1-60 MINUTES Fluoroscopy was utilized by the requesting physician.  No radiographic interpretation.   Dg Hip Port Unilat With Pelvis 1v Left  08/28/2015  CLINICAL DATA:  Postop LEFT total arthroplasty. EXAM: DG HIP (WITH OR WITHOUT PELVIS) 1V PORT LEFT COMPARISON:  None. FINDINGS: LEFT hip total arthroplasty.  No fracture or dislocation. IMPRESSION: No complication following LEFT hip arthroplasty. Electronically Signed   By: Suzy Bouchard M.D.   On: 08/28/2015 15:13   Dg Hip Operative Unilat With Pelvis Left  08/28/2015  CLINICAL DATA:  LEFT hip arthroplasty EXAM: OPERATIVE LEFT HIP (WITH PELVIS IF PERFORMED)  VIEWS TECHNIQUE: Fluoroscopic spot image(s) were submitted for interpretation post-operatively. COMPARISON:  None. FINDINGS: LEFT total hip arthroplasty.  No fracture dislocation IMPRESSION: No complication following LEFT hip arthroplasty. Electronically Signed   By: Suzy Bouchard M.D.   On: 08/28/2015 14:34    Disposition:  To home      Discharge Instructions    Discharge patient    Complete by:  As directed             Follow-up Information    Follow up  with [facility].   Why:  home health physical therapy   Contact information:   [address] [place] [address] [phone]       Follow up with Mcarthur Rossetti, MD In 2 weeks.   Specialty:  Orthopedic Surgery   Contact information:   [address] [place] [address] [phone]        Signed: Mcarthur Rossetti 08/29/2015, 2:02 PM

## 2015-08-29 NOTE — Progress Notes (Signed)
OT Cancellation Note  Patient Details Name: Maria Mcpherson MRN: [id number] DOB: [birth year]   Cancelled Treatment:    Reason Eval/Treat Not Completed: OT screened, no needs identified, will sign off  [place], Thereasa Parkin 08/29/2015, 12:48 PM

## 2015-08-29 NOTE — Progress Notes (Signed)
Physical Therapy Treatment Patient Details Name: Maria Mcpherson MRN: [id number] DOB: October 10, 1955 Today's Date: 08/29/2015    History of Present Illness LDATHA    PT Comments    The patient is progressing well, dressed herself, cues to maintain RW as taking steps without and tolerating. READY FOR DC.  Follow Up Recommendations  Home health PT;Supervision/Assistance - 24 hour     Equipment Recommendations  None recommended by PT    Recommendations for Other Services       Precautions / Restrictions Precautions Precautions: None    Mobility  Bed Mobility Overal bed mobility: Modified Independent                Transfers Overall transfer level: Modified independent Equipment used: Rolling walker (2 wheeled) Transfers: Sit to/from Stand Sit to Stand: Supervision            Ambulation/Gait Ambulation/Gait assistance: Modified independent (Device/Increase time) Ambulation Distance (Feet): 200 Feet Assistive device: Rolling walker (2 wheeled) Gait Pattern/deviations: Step-through pattern     General Gait Details: patient has smoothe gait  and sequence   Stairs            Wheelchair Mobility    Modified Rankin (Stroke Patients Only)       Balance                                    Cognition Arousal/Alertness: Awake/alert Behavior During Therapy: WFL for tasks assessed/performed Overall Cognitive Status: Within Functional Limits for tasks assessed                      Exercises Total Joint Exercises Ankle Circles/Pumps: AROM;Both;10 reps;Supine Quad Sets: AROM;Both;10 reps;Supine Short Arc Quad: AROM;Left;10 reps;Supine Heel Slides: AROM;Left;10 reps;Supine Hip ABduction/ADduction: AROM;Left;Supine    General Comments        Pertinent Vitals/Pain Pain Assessment: No/denies pain Pain Score: 0-No pain Pain Location: L thigh Pain Descriptors / Indicators: Burning Pain Intervention(s): Monitored during  session;Premedicated before session;Ice applied    Home Living Family/patient expects to be discharged to:: Private residence Living Arrangements: Spouse/significant other Available Help at Discharge: Family Type of Home: House Home Access: Stairs to enter   Home Layout: Able to live on main level with bedroom/bathroom;Two level Home Equipment: Walker - 2 wheels Additional Comments: can push off of cabinet beside the  toilet.    Prior Function Level of Independence: Independent          PT Goals (current goals can now be found in the care plan section) Acute Rehab PT Goals Patient Stated Goal: to go home today PT Goal Formulation: With patient/family Time For Goal Achievement: 08/31/15 Potential to Achieve Goals: Good Progress towards PT goals: Progressing toward goals    Frequency  7X/week    PT Plan Current plan remains appropriate    Co-evaluation             End of Session   Activity Tolerance: Patient tolerated treatment well Patient left:  (at sink w/ spouse)     Time: [id number] PT Time Calculation (min) (ACUTE ONLY): 10 min  Charges:  $Gait Training: 8-22 mins                    G Codes:      Claretha Cooper 08/29/2015, 1:28 PM Tresa Endo PT [phone]

## 2015-08-29 NOTE — Care Management Note (Signed)
Case Management Note  Patient Details  Name: Maria Mcpherson MRN: [id number] Date of Birth: April 04, 1956  Subjective/Objective:                  left total hip arthroplasty  Action/Plan: CM spoke with patient at the bedside. Patient was set-up with [facility] pre-operatively  Patient agrees to [facility] [address] for [place]. Denies DME needs. States she has a RW walker and 3N1.   Expected Discharge Date:   08/30/15               Expected Discharge Plan:  [place]  In-House Referral:     Discharge planning Services  CM Consult  Post Acute Care Choice:  Home Health Choice offered to:  Patient  DME Arranged:  N/A DME Agency:  NA  HH Arranged:  PT HH Agency:  [place]  Status of Service:  Completed, signed off  Medicare Important Message Given:    Date Medicare IM Given:    Medicare IM give by:    Date Additional Medicare IM Given:    Additional Medicare Important Message give by:     If discussed at [place] of Stay Meetings, dates discussed:    Additional Comments:  Apolonio Schneiders, RN 08/29/2015, 10:44 AM

## 2015-08-29 NOTE — Progress Notes (Signed)
Subjective: 1 Day Post-Op Procedure(s) (LRB): LEFT TOTAL HIP ARTHROPLASTY ANTERIOR APPROACH (Left) Patient reports pain as moderate.    Objective: Vital signs in last 24 hours: Temp:  [97.3 F (36.3 C)-98.5 F (36.9 C)] 98.5 F (36.9 C) (02/04 1325) Pulse Rate:  [51-73] 63 (02/04 1325) Resp:  [10-18] 16 (02/04 1325) BP: (93-144)/(62-92) 128/69 mmHg (02/04 1325) SpO2:  [97 %-100 %] 99 % (02/04 1325) Weight:  [82 kg (180 lb 12.4 oz)] 82 kg (180 lb 12.4 oz) (02/03 1557)  Intake/Output from previous day: 02/03 0701 - 02/04 0700 In: 4168.8 [P.O.:1180; I.V.:2988.8] Out: 2050 [Urine:1900; Blood:150] Intake/Output this shift: Total I/O In: 525.7 [P.O.:480; I.V.:45.7] Out: 50 [Urine:50]   Recent Labs  08/29/15 0453  HGB 10.5*    Recent Labs  08/29/15 0453  WBC 8.6  RBC 3.73*  HCT 32.7*  PLT 228    Recent Labs  08/29/15 0453  NA 137  K 4.7  CL 104  CO2 25  BUN 23*  CREATININE 1.04*  GLUCOSE 154*  CALCIUM 8.2*   No results for input(s): LABPT, INR in the last 72 hours.  Sensation intact distally Intact pulses distally Dorsiflexion/Plantar flexion intact Incision: dressing C/D/I  Assessment/Plan: 1 Day Post-Op Procedure(s) (LRB): LEFT TOTAL HIP ARTHROPLASTY ANTERIOR APPROACH (Left) Up with therapy Discharge home with home health today.  Maria Mcpherson Y 08/29/2015, 2:00 PM

## 2015-08-29 NOTE — Evaluation (Addendum)
Physical Therapy Evaluation Patient Details Name: Maria Mcpherson MRN: [id number] DOB: Nov 06, 1955 Today's Date: 08/29/2015   History of Present Illness  LDATHA  Clinical Impression  Patient is mobilizing very well. Patient would like to DC today. PT to see for 1 more visit ttoday. No DME. Patient will benefit from PT to address problems listed in the note below.    Follow Up Recommendations Home health PT;Supervision/Assistance - 24 hour    Equipment Recommendations  None recommended by PT    Recommendations for Other Services       Precautions / Restrictions Precautions Precautions: None      Mobility  Bed Mobility Overal bed mobility: Modified Independent                Transfers Overall transfer level: Needs assistance Equipment used: Rolling walker (2 wheeled) Transfers: Sit to/from Stand Sit to Stand: Supervision            Ambulation/Gait Ambulation/Gait assistance: Supervision Ambulation Distance (Feet): 200 Feet Assistive device: Rolling walker (2 wheeled) Gait Pattern/deviations: Step-through pattern     General Gait Details: patient has smoothe gait  and sequence  Stairs            Wheelchair Mobility    Modified Rankin (Stroke Patients Only)       Balance                                             Pertinent Vitals/Pain Pain Assessment: 0-10 Pain Score: 0-No pain Pain Location: L thigh Pain Descriptors / Indicators: Burning Pain Intervention(s): Monitored during session;Premedicated before session;Ice applied    Home Living Family/patient expects to be discharged to:: Private residence Living Arrangements: Spouse/significant other Available Help at Discharge: Family Type of Home: House Home Access: Stairs to enter   Technical brewer of Steps: 1 Home Layout: Able to live on main level with bedroom/bathroom;Two level Home Equipment: Walker - 2 wheels Additional Comments: can push off of cabinet  beside the  toilet.    Prior Function Level of Independence: Independent               Hand Dominance        Extremity/Trunk Assessment               Lower Extremity Assessment: LLE deficits/detail   LLE Deficits / Details: flexes leg   Cervical / Trunk Assessment: Normal  Communication   Communication: No difficulties  Cognition Arousal/Alertness: Awake/alert Behavior During Therapy: WFL for tasks assessed/performed Overall Cognitive Status: Within Functional Limits for tasks assessed                      General Comments      Exercises Total Joint Exercises Ankle Circles/Pumps: AROM;Both;10 reps;Supine Quad Sets: AROM;Both;10 reps;Supine Short Arc Quad: AROM;Left;10 reps;Supine Heel Slides: AROM;Left;10 reps;Supine Hip ABduction/ADduction: AROM;Left;Supine      Assessment/Plan    PT Assessment Patient needs continued PT services  PT Diagnosis Difficulty walking   PT Problem List Decreased strength;Decreased activity tolerance  PT Treatment Interventions DME instruction;Gait training;Functional mobility training;Therapeutic activities;Therapeutic exercise;Patient/family education   PT Goals (Current goals can be found in the Care Plan section) Acute Rehab PT Goals Patient Stated Goal: to go home today PT Goal Formulation: With patient/family Time For Goal Achievement: 08/31/15 Potential to Achieve Goals: Good    Frequency 7X/week   Barriers to  discharge        Co-evaluation               End of Session   Activity Tolerance: Patient tolerated treatment well Patient left: in chair;with call bell/phone within reach Nurse Communication: Mobility status         Time: [id number] PT Time Calculation (min) (ACUTE ONLY): 25 min   Charges:   PT Evaluation $PT Eval Low Complexity: 1 Procedure PT Treatments $Gait Training: 8-22 mins   PT G Codes:        Claretha Cooper 08/29/2015, 9:38 AM  Tresa Endo PT [phone]

## 2015-09-04 NOTE — Progress Notes (Signed)
Utilization review completed.  

## 2015-09-09 ENCOUNTER — Other Ambulatory Visit: Payer: Self-pay | Admitting: Internal Medicine

## 2015-09-22 NOTE — Patient Instructions (Signed)
Once hip surgery is completed and you are able to exercise please develop a regular exercise regimen. Watch her diet. Return in 6 months for follow-up. You need to lose weight and get lipids and glucose under better control.

## 2015-09-22 NOTE — Progress Notes (Signed)
   Subjective:    Patient ID: Maria Mcpherson, female    DOB: November 12, 1955, 60 y.o.   MRN: [id number]  HPI Patient in today for six-month recheck on multiple medical issues including essential hypertension, hyperlipidemia, impaired glucose tolerance, hypokalemia secondary to diuretic therapy. She is scheduled to undergo hip replacement tomorrow by Dr. Ninfa Linden. She's been in considerable pain. Her weight is increased 5 pounds. Not able to exercise due to hip pain. At last visit June 2016 hemoglobin A1c had increased from 5.8-6.1% and she was started on metformin. Prior to that she had gained 7 pounds since last physical exam. She has a history of dependent edema. She has a history of snoring and wants sleep apnea evaluation in the near future    Review of Systems     Objective:   Physical Exam  Skin warm and dry. Nodes none. Chest clear to auscultation. Neck supple without JVD thyromegaly or carotid bruits. Chest clear to auscultation. Cardiac exam regular rate and rhythm normal S1 and S2. Extremities without edema.      Assessment & Plan:  Osteoarthritis of hip-scheduled for hip arthroplasty tomorrow by Dr. Ninfa Linden  Essential hypertension-stable on current regimen  Hyperlipidemia total cholesterol 231 and LDL cholesterol 134. We'll give her an additional 6 months of diet and exercise. She is on Zocor.  Impaired glucose tolerance-treated with metformin-hemoglobin A1c 6% has increased from 5.8% 6 months ago. Diet and exercise should help. Continue metformin.  Hypokalemia secondary to diuretic therapy-potassium 3.9 on potassium replacement therapy  Obesity  History of snoring-schedule evaluation for sleep apnea  Plan: To have hip replacement tomorrow. Medically stable for that.

## 2015-10-14 ENCOUNTER — Encounter: Payer: Self-pay | Admitting: Pulmonary Disease

## 2015-10-14 ENCOUNTER — Ambulatory Visit (INDEPENDENT_AMBULATORY_CARE_PROVIDER_SITE_OTHER): Payer: 59 | Admitting: Pulmonary Disease

## 2015-10-14 VITALS — BP 132/88 | HR 74 | Ht 67.0 in | Wt 185.8 lb

## 2015-10-14 DIAGNOSIS — G4710 Hypersomnia, unspecified: Secondary | ICD-10-CM | POA: Diagnosis not present

## 2015-10-14 DIAGNOSIS — E663 Overweight: Secondary | ICD-10-CM | POA: Diagnosis not present

## 2015-10-14 DIAGNOSIS — R0609 Other forms of dyspnea: Secondary | ICD-10-CM | POA: Diagnosis not present

## 2015-10-14 DIAGNOSIS — I10 Essential (primary) hypertension: Secondary | ICD-10-CM | POA: Diagnosis not present

## 2015-10-14 DIAGNOSIS — R0600 Dyspnea, unspecified: Secondary | ICD-10-CM | POA: Insufficient documentation

## 2015-10-14 NOTE — Assessment & Plan Note (Signed)
patient Has hypersomnia, frequent awakening during the night, unrefreshed sleep, snoring, gasping, choking. Symptoms started when she hit menopause. Denies abnormal behavior and sleep. She usually gets sleepy at night when she is reading. Sleepiness affects her functionality. She hasn't had a sleep study. She's had difficult to control blood pressure, usually in the morning when she wakes up. She feels her heart is racing when she wakes up in the middle of the night.  Her husband has sleep apnea and he uses CPAP therapy. She anticipates she would not have any problems with CPAP.  ESS is 9cm.  Neck circ is 13 inches. Microgenia present. Crowded airway.   Plan : 1. We extensively discussed the diagnosis of sleep apnea and CPAP therapy. 2. Plan for home sleep study. If positive, even if mild, plan to treat with auto CPAP 5-15 cm water given difficult to control blood pressure in the morning and hypersomnia at night when she is reading. 3. If negative, may need to do one in lab study after couple months. 4. Husband has sleep apnea and is on CPAP therapy. Anticipate no issues with CPAP.

## 2015-10-14 NOTE — Assessment & Plan Note (Signed)
Patient with hypertension usually in the morning. Plan for sleep study and treat. Observe hypertension for now.

## 2015-10-14 NOTE — Progress Notes (Signed)
Subjective:    Patient ID: Maria Mcpherson, female    DOB: 07/13/1956, 60 y.o.   MRN: [id number]  HPI   This is the case of Maria Mcpherson, 60 y.o. Female, who was referred by Dr. Tedra Senegal in consultation regarding possible OSA.   As you very well know, patient Has hypersomnia, frequent awakening during the night, unrefreshed sleep, snoring, gasping, choking. Symptoms started when she hit menopause. Denies abnormal behavior and sleep. She usually gets sleepy at night when she is reading. Sleepiness affects her functionality. She hasn't had a sleep study. She's had difficult to control blood pressure, usually in the morning when she wakes up. She feels her heart is racing when she wakes up in the middle of the night.  Her husband has sleep apnea and he uses CPAP therapy. She anticipates she would not have any problems with CPAP.  ESS is 9cm.   Review of Systems  Constitutional: Negative.  Negative for fever and unexpected weight change.       Gained 15-20 pounds last 2 years.  HENT: Negative.  Negative for congestion, dental problem, ear pain, nosebleeds, postnasal drip, rhinorrhea, sinus pressure, sneezing, sore throat and trouble swallowing.   Eyes: Negative.  Negative for redness and itching.  Respiratory: Positive for shortness of breath. Negative for cough, chest tightness and wheezing.   Cardiovascular: Negative.  Negative for palpitations and leg swelling.  Gastrointestinal: Negative.  Negative for nausea and vomiting.  Endocrine: Negative.   Genitourinary: Negative.  Negative for dysuria.  Musculoskeletal: Negative.  Negative for joint swelling.  Skin: Negative.  Negative for rash.  Allergic/Immunologic: Negative.   Neurological: Negative.  Negative for headaches.  Hematological: Negative.  Does not bruise/bleed easily.  Psychiatric/Behavioral: Negative.  Negative for dysphoric mood. The patient is not nervous/anxious.   All other systems reviewed and are negative.  Past  Medical History  Diagnosis Date  . Fibrocystic breast disease   . HTN (hypertension)   . Hypokalemia   . pulmonary emboli 10/06    same time as oophrectomy/myomectomy, evlauation was negative  . Heart murmur     very slight  . Hyperlipidemia   . Arthritis   . History of nonmelanoma skin cancer   . Borderline diabetes     No history of cancer, asthma, COPD.  Family History  Problem Relation Age of Onset  . Breast cancer Mother 37    mid 51s  . Leukemia Father   . Hypertension Mother   . Stroke Maternal Grandfather      Past Surgical History  Procedure Laterality Date  . Tonsillectomy and adenoidectomy  1963  . Oophorectomy  10/06    only had one ovary removed unsure of rt or left  . Total hip arthroplasty Left 08/28/2015    Procedure: LEFT TOTAL HIP ARTHROPLASTY ANTERIOR APPROACH;  Surgeon: Mcarthur Rossetti, MD;  Location: [facility];  Service: Orthopedics;  Laterality: Left;    Social History   Social History  . Marital Status: Married    Spouse Name: N/A  . Number of Children: N/A  . Years of Education: N/A   Occupational History  . Not on file.   Social History Main Topics  . Smoking status: Never Smoker   . Smokeless tobacco: Never Used  . Alcohol Use: Yes     Comment: occasional  . Drug Use: No  . Sexual Activity: No   Other Topics Concern  . Not on file   Social History Narrative   She lives in  [place], drives to [place]. She is an Forensic psychologist. Denies smoking. Denies taking alcohol.  Allergies  Allergen Reactions  . Ampicillin Rash    Has patient had a PCN reaction causing immediate rash, facial/tongue/throat swelling, SOB or lightheadedness with hypotension: Unsure Has patient had a PCN reaction causing severe rash involving mucus membranes or skin necrosis: No Has patient had a PCN reaction that required hospitalization No Has patient had a PCN reaction occurring within the last 10 years: No If all of the above answers are "NO", then  may proceed with Cephalosporin use.     Outpatient Prescriptions Prior to Visit  Medication Sig Dispense Refill  . calcium carbonate (OS-CAL) 600 MG TABS Take 600 mg by mouth 2 (two) times daily with a meal.      . fish oil-omega-3 fatty acids 1000 MG capsule Take 1 g by mouth daily.      . hydrochlorothiazide (HYDRODIURIL) 25 MG tablet TAKE 1 TABLET (25 MG TOTAL) BY MOUTH DAILY. 90 tablet 3  . KLOR-CON M20 20 MEQ tablet TAKE ONE TABLET BY MOUTH THREE TIMES A DAY 270 tablet 1  . loratadine (CLARITIN) 10 MG tablet Take 10 mg by mouth daily.    . metFORMIN (GLUCOPHAGE) 500 MG tablet TAKE 1 TABLET BY MOUTH EVERYDAY WITH A MEAL 90 tablet 1  . metoprolol (LOPRESSOR) 50 MG tablet TAKE 1 TABLET (50 MG TOTAL) BY MOUTH DAILY. 90 tablet 1  . Nutritional Supplements (JUICE PLUS FIBRE PO) Take 1 tablet by mouth daily.     Marland Kitchen Propylene Glycol (SYSTANE BALANCE) 0.6 % SOLN Apply 1 drop to eye 3 (three) times daily as needed (Dry eyes).    . simvastatin (ZOCOR) 40 MG tablet Take 1 tablet (40 mg total) by mouth at bedtime. (Patient taking differently: Take 40 mg by mouth every morning. ) 90 tablet 3  . aspirin EC 325 MG EC tablet Take 1 tablet (325 mg total) by mouth 2 (two) times daily after a meal. 30 tablet 0  . HYDROcodone-acetaminophen (NORCO) 5-325 MG tablet Take 1 tablet by mouth every 6 (six) hours as needed for moderate pain. 60 tablet 0  . KLOR-CON M20 20 MEQ tablet TAKE ONE TABLET BY MOUTH THREE TIMES A DAY 270 tablet 1  . methocarbamol (ROBAXIN) 500 MG tablet Take 1 tablet (500 mg total) by mouth every 6 (six) hours as needed for muscle spasms. 60 tablet 0   No facility-administered medications prior to visit.   Meds ordered this encounter  Medications  . aspirin 81 MG tablet    Sig: Take 81 mg by mouth daily.          Objective:   Physical Exam Vitals:  Filed Vitals:   10/14/15 1522  BP: 132/88  Pulse: 74  Height: 5\' 7"  (1.702 m)  Weight: 185 lb 12.8 oz (84.278 kg)  SpO2: 96%     Constitutional/General:  Pleasant, well-nourished, well-developed, not in any distress,  Comfortably seating.  Well kempt  Body mass index is 29.09 kg/(m^2). Wt Readings from Last 3 Encounters:  10/14/15 185 lb 12.8 oz (84.278 kg)  08/28/15 180 lb 12.4 oz (82 kg)  08/27/15 182 lb (82.555 kg)    Neck circumference:   HEENT: Pupils equal and reactive to light and accommodation. Anicteric sclerae. Normal nasal mucosa.   No oral  lesions,  mouth clear,  oropharynx clear, no postnasal drip. (-) Oral thrush. No dental caries.  Airway - Mallampati class III  Neck: No masses. Midline trachea. No JVD, (-)  LAD. (-) bruits appreciated.  Respiratory/Chest: Grossly normal chest. (-) deformity. (-) Accessory muscle use.  Symmetric expansion. (-) Tenderness on palpation.  Resonant on percussion.  Diminished BS on both lower lung zones. (-) wheezing, crackles, rhonchi (-) egophony  Cardiovascular: Regular rate and  rhythm, heart sounds normal, no murmur or gallops, no peripheral edema  Gastrointestinal:  Normal bowel sounds. Soft, non-tender. No hepatosplenomegaly.  (-) masses.   Musculoskeletal:  Normal muscle tone. Normal gait.   Extremities: Grossly normal. (-) clubbing, cyanosis.  (-) edema  Skin: (-) rash,lesions seen.   Neurological/Psychiatric : alert, oriented to time, place, person. Normal mood and affect             Assessment & Plan:  Hypersomnia patient Has hypersomnia, frequent awakening during the night, unrefreshed sleep, snoring, gasping, choking. Symptoms started when she hit menopause. Denies abnormal behavior and sleep. She usually gets sleepy at night when she is reading. Sleepiness affects her functionality. She hasn't had a sleep study. She's had difficult to control blood pressure, usually in the morning when she wakes up. She feels her heart is racing when she wakes up in the middle of the night.  Her husband has sleep apnea and he uses CPAP  therapy. She anticipates she would not have any problems with CPAP.  ESS is 9cm.  Neck circ is 13 inches. Microgenia present. Crowded airway.   Plan : 1. We extensively discussed the diagnosis of sleep apnea and CPAP therapy. 2. Plan for home sleep study. If positive, even if mild, plan to treat with auto CPAP 5-15 cm water given difficult to control blood pressure in the morning and hypersomnia at night when she is reading. 3. If negative, may need to do one in lab study after couple months. 4. Husband has sleep apnea and is on CPAP therapy. Anticipate no issues with CPAP.  Overweight Advised on weight reduction.  Essential hypertension, benign Patient with hypertension usually in the morning. Plan for sleep study and treat. Observe hypertension for now.  Dyspnea on exertion Exertional dyspnea likely related to deconditioning, restrictive ventilatory defect, overweight. Observe for now. We'll hold off on PFTs.   Thank you very much for letting me participate in this patient's care. Please do not hesitate to give me a call if you have any questions or concerns regarding the treatment plan.   Patient will follow up with me in 2 mos.     Monica Becton, MD 3/22/20174:56 PM Pulmonary and [place] Pager: [phone] Office: [phone], Fax: [phone]

## 2015-10-14 NOTE — Assessment & Plan Note (Signed)
Exertional dyspnea likely related to deconditioning, restrictive ventilatory defect, overweight. Observe for now. We'll hold off on PFTs.

## 2015-10-14 NOTE — Patient Instructions (Signed)
1. We will order you a home sleep study. 2. Please give [place] a call if you don't hear back from [place] in 1-2 weeks regarding the home sleep study.  Return to clinic in 2 months.

## 2015-10-14 NOTE — Assessment & Plan Note (Signed)
Advised on weight reduction. 

## 2015-11-16 ENCOUNTER — Telehealth: Payer: Self-pay | Admitting: Pulmonary Disease

## 2015-11-16 DIAGNOSIS — G4733 Obstructive sleep apnea (adult) (pediatric): Secondary | ICD-10-CM

## 2015-11-16 NOTE — Telephone Encounter (Signed)
Spoke with pt. States that she was to be set up for a home sleep study. She has not been contacted about this. Looks like the order was not placed. This has been put in. Nothing further was needed at this time.

## 2015-12-01 DIAGNOSIS — G4733 Obstructive sleep apnea (adult) (pediatric): Secondary | ICD-10-CM | POA: Diagnosis not present

## 2015-12-03 ENCOUNTER — Ambulatory Visit: Payer: 59 | Admitting: Pulmonary Disease

## 2015-12-04 ENCOUNTER — Telehealth: Payer: Self-pay | Admitting: Pulmonary Disease

## 2015-12-04 DIAGNOSIS — G4733 Obstructive sleep apnea (adult) (pediatric): Secondary | ICD-10-CM

## 2015-12-04 NOTE — Telephone Encounter (Signed)
LMTCB

## 2015-12-04 NOTE — Telephone Encounter (Signed)
  Please call the pt and tell the pt the [place]  showed OSA  Pt stops breathing 22   times an hour.   Please order autoCPAP 5-15 cm H2O. Patient will need a mask fitting session. Patient will need a 1 month download.   Patient needs to be seen 4-6 weeks after obtaining the cpap machine. Let me know if you receive this.   Thanks!   J. Shirl Harris, MD 12/04/2015, 3:19 PM

## 2015-12-05 DIAGNOSIS — G4733 Obstructive sleep apnea (adult) (pediatric): Secondary | ICD-10-CM | POA: Diagnosis not present

## 2015-12-08 NOTE — Telephone Encounter (Signed)
Spoke with pt and gave results and recommendations. Pt agrees to start CPAP therapy. Order placed. Pt scheduled for follow up 01/28/16. Nothing further needed.

## 2015-12-08 NOTE — Telephone Encounter (Signed)
Pt returning call for results [phone].Hillery Hunter

## 2015-12-09 ENCOUNTER — Other Ambulatory Visit: Payer: Self-pay | Admitting: *Deleted

## 2015-12-09 DIAGNOSIS — G4733 Obstructive sleep apnea (adult) (pediatric): Secondary | ICD-10-CM

## 2015-12-22 ENCOUNTER — Other Ambulatory Visit: Payer: Self-pay | Admitting: Internal Medicine

## 2016-01-01 ENCOUNTER — Ambulatory Visit: Payer: 59 | Admitting: Pulmonary Disease

## 2016-01-28 ENCOUNTER — Encounter: Payer: Self-pay | Admitting: Pulmonary Disease

## 2016-01-28 ENCOUNTER — Ambulatory Visit (INDEPENDENT_AMBULATORY_CARE_PROVIDER_SITE_OTHER): Payer: 59 | Admitting: Pulmonary Disease

## 2016-01-28 VITALS — BP 128/82 | HR 62 | Ht 67.0 in | Wt 186.6 lb

## 2016-01-28 DIAGNOSIS — I10 Essential (primary) hypertension: Secondary | ICD-10-CM

## 2016-01-28 DIAGNOSIS — E663 Overweight: Secondary | ICD-10-CM

## 2016-01-28 DIAGNOSIS — G4733 Obstructive sleep apnea (adult) (pediatric): Secondary | ICD-10-CM

## 2016-01-28 NOTE — Assessment & Plan Note (Signed)
Patient has hypersomnia. Hypersomnia affected her functionality. 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 Home sleep study done in March 2017:AHI 22. She got her CPAP machine in May 2017. She feels better using it. Less sleepiness. More energy. Hypersomnia does not affect her functionality anymore. No issues with it. Uses nasal pillows. Download from  June 2017: 100%, AHI   We extensively discussed the importance of treating OSA and the need to use PAP therapy.   Continue with auto CPAP 5-10 cm water.   Patient was instructed to have mask, tubings, filter, reservoir cleaned at least once a week with soapy water.  Patient was instructed to call the office if he/she is having issues with the PAP device.    I advised patient to obtain sufficient amount of sleep --  7 to 8 hours at least in a 24 hr period.  Patient was advised to follow good sleep hygiene.  Patient was advised NOT to engage in activities requiring concentration and/or vigilance if he/she is and  sleepy.  Patient is NOT to drive if he/she is sleepy.

## 2016-01-28 NOTE — Patient Instructions (Signed)
  It was a pleasure taking care of you today!  You are diagnosed with Obstructive Sleep Apnea or OSA.  You stop breathing  22 times an hour.   Continue using his CPAP machine. It is set at auto CPAP 5-10 cm water.  Please make sure you use your CPAP device everytime you sleep.  We will monitor the usage of your machine per your insurance requirement.  Your insurance company may take the machine from you if you are not using it regularly.   Please clean the mask, tubings, filter, water reservoir with soapy water every week.  Please use distilled water for the water reservoir.   Please call the office or your machine provider (DME company) if you are having issues with the device.   Return to clinic in 6 months.

## 2016-01-28 NOTE — Assessment & Plan Note (Signed)
Weight reduction 

## 2016-01-28 NOTE — Assessment & Plan Note (Signed)
Patient with hypertension usually in the morning. She has not checked her blood pressure morning. Her blood pressure this morning was okay. Told her to check her blood pressure in the morning was she is using her CPAP. If it's elevated, she needs more medicines for her hypertension.

## 2016-01-28 NOTE — Progress Notes (Signed)
Subjective:    Patient ID: Maria Mcpherson, female    DOB: 1956-03-13, 60 y.o.   MRN: [id number]  HPI   This is the case of Maria Mcpherson, 60 y.o. Female, who was referred by Dr. Tedra Senegal in consultation regarding possible OSA.   As you very well know, patient Has hypersomnia, frequent awakening during the night, unrefreshed sleep, snoring, gasping, choking. Symptoms started when she hit menopause. Denies abnormal behavior and sleep. She usually gets sleepy at night when she is reading. Sleepiness affects her functionality. She hasn't had a sleep study. She's had difficult to control blood pressure, usually in the morning when she wakes up. She feels her heart is racing when she wakes up in the middle of the night.  Her husband has sleep apnea and he uses CPAP therapy. She anticipates she would not have any problems with CPAP.  ESS is 9cm.   ROV (01/28/16)  Patient returns to the office as follow-up on her sleep apnea. She had a home sleep study done in March which shows moderate sleep apnea with AHI of 22. Since that time,she has received her machine and feels better using it. No issues.More energy Less sleepiness. Download for June shows 100% compliance, AHI of 1.4. Heart rate issue is better. Not sure if BP is better controled.   Review of Systems  Constitutional: Negative.  Negative for fever and unexpected weight change.       Gained 15-20 pounds last 2 years.  HENT: Negative.  Negative for congestion, dental problem, ear pain, nosebleeds, postnasal drip, rhinorrhea, sinus pressure, sneezing, sore throat and trouble swallowing.   Eyes: Negative.  Negative for redness and itching.  Respiratory: Positive for shortness of breath. Negative for cough, chest tightness and wheezing.   Cardiovascular: Negative.  Negative for palpitations and leg swelling.  Gastrointestinal: Negative.  Negative for nausea and vomiting.  Endocrine: Negative.   Genitourinary: Negative.  Negative for dysuria.   Musculoskeletal: Negative.  Negative for joint swelling.  Skin: Negative.  Negative for rash.  Allergic/Immunologic: Negative.   Neurological: Negative.  Negative for headaches.  Hematological: Negative.  Does not bruise/bleed easily.  Psychiatric/Behavioral: Negative.  Negative for dysphoric mood. The patient is not nervous/anxious.   All other systems reviewed and are negative.    Objective:   Physical Exam Vitals:  Filed Vitals:   01/28/16 1133  BP: 128/82  Pulse: 62  Height: 5\' 7"  (1.702 m)  Weight: 186 lb 9.6 oz (84.641 kg)  SpO2: 96%    Constitutional/General:  Pleasant, well-nourished, well-developed, not in any distress,  Comfortably seating.  Well kempt  Body mass index is 29.22 kg/(m^2). Wt Readings from Last 3 Encounters:  01/28/16 186 lb 9.6 oz (84.641 kg)  10/14/15 185 lb 12.8 oz (84.278 kg)  08/28/15 180 lb 12.4 oz (82 kg)     HEENT: Pupils equal and reactive to light and accommodation. Anicteric sclerae. Normal nasal mucosa.   No oral  lesions,  mouth clear,  oropharynx clear, no postnasal drip. (-) Oral thrush. No dental caries.  Airway - Mallampati class III  Neck: No masses. Midline trachea. No JVD, (-) LAD. (-) bruits appreciated.  Respiratory/Chest: Grossly normal chest. (-) deformity. (-) Accessory muscle use.  Symmetric expansion. (-) Tenderness on palpation.  Resonant on percussion.  Diminished BS on both lower lung zones. (-) wheezing, crackles, rhonchi (-) egophony  Cardiovascular: Regular rate and  rhythm, heart sounds normal, no murmur or gallops, no peripheral edema  Gastrointestinal:  Normal bowel sounds.  Soft, non-tender. No hepatosplenomegaly.  (-) masses.   Musculoskeletal:  Normal muscle tone. Normal gait.   Extremities: Grossly normal. (-) clubbing, cyanosis.  (-) edema  Skin: (-) rash,lesions seen.   Neurological/Psychiatric : alert, oriented to time, place, person. Normal mood and affect             Assessment  & Plan:  OSA (obstructive sleep apnea) Patient has hypersomnia. Hypersomnia affected her functionality. 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 Home sleep study done in March 2017:AHI 22. She got her CPAP machine in May 2017. She feels better using it. Less sleepiness. More energy. Hypersomnia does not affect her functionality anymore. No issues with it. Uses nasal pillows. Download from  June 2017: 100%, AHI   We extensively discussed the importance of treating OSA and the need to use PAP therapy.   Continue with auto CPAP 5-10 cm water.   Patient was instructed to have mask, tubings, filter, reservoir cleaned at least once a week with soapy water.  Patient was instructed to call the office if he/she is having issues with the PAP device.    I advised patient to obtain sufficient amount of sleep --  7 to 8 hours at least in a 24 hr period.  Patient was advised to follow good sleep hygiene.  Patient was advised NOT to engage in activities requiring concentration and/or vigilance if he/she is and  sleepy.  Patient is NOT to drive if he/she is sleepy.    Essential hypertension, benign Patient with hypertension usually in the morning. She has not checked her blood pressure morning. Her blood pressure this morning was okay. Told her to check her blood pressure in the morning was she is using her CPAP. If it's elevated, she needs more medicines for her hypertension.  Overweight Weight reduction.    Patient will follow up with me in 6 mos.     Monica Becton, MD 7/6/201712:07 PM Pulmonary and [place] Pager: [phone] Office: [phone], Fax: [phone]

## 2016-02-11 ENCOUNTER — Other Ambulatory Visit: Payer: Self-pay | Admitting: Internal Medicine

## 2016-03-08 ENCOUNTER — Other Ambulatory Visit: Payer: 59 | Admitting: Internal Medicine

## 2016-03-08 DIAGNOSIS — Z0000 Encounter for general adult medical examination without abnormal findings: Secondary | ICD-10-CM

## 2016-03-08 DIAGNOSIS — E785 Hyperlipidemia, unspecified: Secondary | ICD-10-CM

## 2016-03-08 DIAGNOSIS — R7302 Impaired glucose tolerance (oral): Secondary | ICD-10-CM

## 2016-03-08 DIAGNOSIS — I10 Essential (primary) hypertension: Secondary | ICD-10-CM

## 2016-03-08 DIAGNOSIS — E669 Obesity, unspecified: Secondary | ICD-10-CM

## 2016-03-08 DIAGNOSIS — E876 Hypokalemia: Secondary | ICD-10-CM

## 2016-03-08 LAB — CBC WITH DIFFERENTIAL/PLATELET
Basophils Absolute: 41 cells/uL (ref 0–200)
Basophils Relative: 1 %
EOS ABS: 246 {cells}/uL (ref 15–500)
EOS PCT: 6 %
HCT: 40.1 % (ref 35.0–45.0)
Hemoglobin: 13.4 g/dL (ref 11.7–15.5)
Lymphs Abs: 1271 cells/uL (ref 850–3900)
MCH: 27.5 pg (ref 27.0–33.0)
MCHC: 33.4 g/dL (ref 32.0–36.0)
MCV: 82.3 fL (ref 80.0–100.0)
MONO ABS: 410 {cells}/uL (ref 200–950)
MONOS PCT: 10 %
MPV: 10.7 fL (ref 7.5–12.5)
NEUTROS PCT: 52 %
Neutro Abs: 2132 cells/uL (ref 1500–7800)
PLATELETS: 243 10*3/uL (ref 140–400)
RBC: 4.87 MIL/uL (ref 3.80–5.10)
RDW: 17.1 % — AB (ref 11.0–15.0)
WBC: 4.1 10*3/uL (ref 3.8–10.8)

## 2016-03-08 LAB — COMPLETE METABOLIC PANEL WITH GFR
ALT: 29 U/L (ref 6–29)
AST: 27 U/L (ref 10–35)
Albumin: 4.1 g/dL (ref 3.6–5.1)
Alkaline Phosphatase: 61 U/L (ref 33–130)
BUN: 18 mg/dL (ref 7–25)
CO2: 30 mmol/L (ref 20–31)
Calcium: 9.4 mg/dL (ref 8.6–10.4)
Chloride: 102 mmol/L (ref 98–110)
Creat: 0.92 mg/dL (ref 0.50–1.05)
GFR, EST AFRICAN AMERICAN: 79 mL/min (ref >=60)
GFR, EST NON AFRICAN AMERICAN: 68 mL/min (ref >=60)
Glucose, Bld: 99 mg/dL (ref 65–99)
Potassium: 4 mmol/L (ref 3.5–5.3)
Sodium: 143 mmol/L (ref 135–146)
Total Bilirubin: 0.5 mg/dL (ref 0.2–1.2)
Total Protein: 6.5 g/dL (ref 6.1–8.1)

## 2016-03-08 LAB — LIPID PANEL
CHOL/HDL RATIO: 2.5 ratio (ref <=5.0)
Cholesterol: 185 mg/dL (ref 125–200)
HDL: 73 mg/dL (ref >=46)
LDL CALC: 85 mg/dL (ref <130)
TRIGLYCERIDES: 133 mg/dL (ref <150)
VLDL: 27 mg/dL (ref <30)

## 2016-03-08 LAB — TSH: TSH: 1.29 mIU/L

## 2016-03-09 LAB — VITAMIN D 25 HYDROXY (VIT D DEFICIENCY, FRACTURES): Vit D, 25-Hydroxy: 39 ng/mL (ref 30–100)

## 2016-03-09 LAB — HEMOGLOBIN A1C
Hgb A1c MFr Bld: 5.8 % — ABNORMAL HIGH (ref <5.7)
MEAN PLASMA GLUCOSE: 120 mg/dL

## 2016-03-09 LAB — MICROALBUMIN / CREATININE URINE RATIO
Creatinine, Urine: 131 mg/dL (ref 20–320)
Microalb Creat Ratio: 4 mcg/mg creat (ref <30)
Microalb, Ur: 0.5 mg/dL

## 2016-03-10 ENCOUNTER — Ambulatory Visit (INDEPENDENT_AMBULATORY_CARE_PROVIDER_SITE_OTHER): Payer: 59 | Admitting: Internal Medicine

## 2016-03-10 ENCOUNTER — Encounter: Payer: Self-pay | Admitting: Internal Medicine

## 2016-03-10 VITALS — BP 128/86 | HR 85 | Temp 98.7°F | Ht 67.0 in | Wt 188.0 lb

## 2016-03-10 DIAGNOSIS — R7302 Impaired glucose tolerance (oral): Secondary | ICD-10-CM | POA: Diagnosis not present

## 2016-03-10 DIAGNOSIS — G4710 Hypersomnia, unspecified: Secondary | ICD-10-CM | POA: Diagnosis not present

## 2016-03-10 DIAGNOSIS — E8881 Metabolic syndrome: Secondary | ICD-10-CM

## 2016-03-10 DIAGNOSIS — Z0000 Encounter for general adult medical examination without abnormal findings: Secondary | ICD-10-CM | POA: Diagnosis not present

## 2016-03-10 DIAGNOSIS — G4730 Sleep apnea, unspecified: Secondary | ICD-10-CM

## 2016-03-10 DIAGNOSIS — E785 Hyperlipidemia, unspecified: Secondary | ICD-10-CM | POA: Diagnosis not present

## 2016-03-10 DIAGNOSIS — E669 Obesity, unspecified: Secondary | ICD-10-CM | POA: Diagnosis not present

## 2016-03-10 DIAGNOSIS — R609 Edema, unspecified: Secondary | ICD-10-CM | POA: Diagnosis not present

## 2016-03-10 DIAGNOSIS — E876 Hypokalemia: Secondary | ICD-10-CM | POA: Diagnosis not present

## 2016-03-10 DIAGNOSIS — I10 Essential (primary) hypertension: Secondary | ICD-10-CM | POA: Diagnosis not present

## 2016-03-10 LAB — POCT URINALYSIS DIPSTICK
Bilirubin, UA: NEGATIVE
Blood, UA: NEGATIVE
Glucose, UA: NEGATIVE
KETONES UA: NEGATIVE
Leukocytes, UA: NEGATIVE
Nitrite, UA: NEGATIVE
PH UA: 6
PROTEIN UA: NEGATIVE
SPEC GRAV UA: 1.02
UROBILINOGEN UA: 0.2

## 2016-03-10 MED ORDER — FUROSEMIDE 20 MG PO TABS: 20.0000 mg | ORAL_TABLET | Freq: Every day | ORAL | 2 refills | Status: DC

## 2016-03-10 NOTE — Progress Notes (Signed)
   Subjective:    Patient ID: Maria Mcpherson, female    DOB: Jan 26, 1956, 60 y.o.   MRN: [id number]  HPI 60 year old Maria Mcpherson today for health maintenance exam and evaluation of medical issues.  History of basal cell carcinoma left lateral nose removed 2014-09-01 with Mohs procedure. Has a history of essential hypertension and hyperlipidemia. History of hypokalemia secondary to diuretic therapy. History of impaired glucose tolerance treated with metformin. She had left hip replacement surgery a few months ago and did well. However in 09-01-14 weight 177 pounds. Not getting to exercise as much as she would like.  History of dependent edema which is gotten worse with prolonged sitting. History of fibrocystic breast disease.  In September 01, 2013 had postmenopausal bleeding and saw Dr. Edwinna Mcpherson. She had endometrial biopsy that showed proliferative endometrium. Was found to have several fibroids. These are being watched conservatively. She had a Pap smear in Sep 01, 2013.  History of pulmonary embolus September 2006 after removal of a large ovarian fibroma which apparently got twisted with part of the ovarian vein resulting in a pulmonary embolus. She was followed on Coumadin here for several months. She's never had a recurrence. She's only had right oophorectomy not a complete hysterectomy. History of tonsillectomy and adenoidectomy 09-01-61.  Colonoscopy April 2011 with 10 year follow-up advised. Bone testy study 09/01/2009. Has had vaccinations for hepatitis A, hepatitis B, MMR, typhoid due to foreign travel.  Social history: She is a Secondary school teacher and also serves on the [employer] She is married. Son passed away in 09/02/2007. Daughter is residing in [place] and has history of alcohol abuse. Has been in rehabilitation. Patient and her husband became Maria Mcpherson parents to a teenage boy and girl through their church. Patient does not smoke or consume alcohol.  Family history: Mother with history of breast cancer and  hypertension. Maternal grandmother with history of stroke. Father with history of leukemia. 2 brothers in good health. One sister in good health.   From 2015-2016 she gained 7 pounds. This past year gained 11 pounds.    Review of Systems     Objective:   Physical Exam        Assessment & Plan:  Impaired glucose tolerance. Hemoglobin A1c 5.8% and previously was 6% several months ago. Continue metformin.  History of hypokalemia. Serum potassium is normal at 4.0 with potassium supplementation. Once the weather gets here the dependent edema may improve. In any case she needs follow-up on potassium while on Lasix. I don't see a return appointment at this point. Will need follow-up with that. She will stop HCTZ.  Hyperlipidemia-lipid panel is normal  Status post left hip arthroplasty 09-02-2015  Essential hypertension-stable on current regimen  Obesity-needs to really try to get weight off  Dependent edema-trial of Lasix 20 mg daily. Continue potassium supplement.  Snoring-Saw pulmonologist to recommended weight loss and follow-up in 6 months. He will ordered C Pap 5-10 cm of water. Diagnosed with obstructive sleep apnea and hypersomnia

## 2016-03-22 LAB — HM MAMMOGRAPHY

## 2016-03-24 NOTE — Patient Instructions (Addendum)
Change HCTZ to Lasix 20 mg daily for dependent edema. Work on diet exercise and weight loss. Needs to follow-up in 6 weeks with office visit, and basic metabolic panel on Lasix. Otherwise return in 6 months.

## 2016-03-31 ENCOUNTER — Encounter: Payer: Self-pay | Admitting: Internal Medicine

## 2016-04-08 ENCOUNTER — Other Ambulatory Visit: Payer: Self-pay

## 2016-04-08 MED ORDER — SIMVASTATIN 40 MG PO TABS: ORAL_TABLET | ORAL | 1 refills | Status: DC

## 2016-04-08 MED ORDER — POTASSIUM CHLORIDE CRYS ER 20 MEQ PO TBCR: 20.0000 meq | EXTENDED_RELEASE_TABLET | Freq: Two times a day (BID) | ORAL | 0 refills | Status: DC

## 2016-04-08 MED ORDER — FUROSEMIDE 20 MG PO TABS: 20.0000 mg | ORAL_TABLET | Freq: Every day | ORAL | 2 refills | Status: DC

## 2016-04-08 MED ORDER — METFORMIN HCL 500 MG PO TABS: ORAL_TABLET | ORAL | 1 refills | Status: DC

## 2016-04-13 ENCOUNTER — Encounter: Payer: Self-pay | Admitting: Internal Medicine

## 2016-04-13 ENCOUNTER — Telehealth: Payer: Self-pay | Admitting: Internal Medicine

## 2016-04-13 NOTE — Telephone Encounter (Signed)
Refill HCTZ No. 90 with 3 refills to [facility] order pharmacy

## 2016-04-17 ENCOUNTER — Other Ambulatory Visit: Payer: Self-pay | Admitting: Internal Medicine

## 2016-04-18 ENCOUNTER — Telehealth: Payer: Self-pay | Admitting: Internal Medicine

## 2016-04-18 MED ORDER — METOPROLOL TARTRATE 50 MG PO TABS: ORAL_TABLET | ORAL | 3 refills | Status: DC

## 2016-04-18 NOTE — Telephone Encounter (Signed)
Refill metoprolol for one year to [facility]

## 2016-06-01 ENCOUNTER — Other Ambulatory Visit: Payer: Self-pay | Admitting: Internal Medicine

## 2016-07-07 ENCOUNTER — Other Ambulatory Visit: Payer: Self-pay | Admitting: Internal Medicine

## 2016-08-01 ENCOUNTER — Encounter: Payer: Self-pay | Admitting: Pulmonary Disease

## 2016-08-03 ENCOUNTER — Encounter: Payer: Self-pay | Admitting: Pulmonary Disease

## 2016-08-03 ENCOUNTER — Ambulatory Visit (INDEPENDENT_AMBULATORY_CARE_PROVIDER_SITE_OTHER): Payer: 59 | Admitting: Pulmonary Disease

## 2016-08-03 DIAGNOSIS — G4733 Obstructive sleep apnea (adult) (pediatric): Secondary | ICD-10-CM

## 2016-08-03 NOTE — Progress Notes (Signed)
Subjective:    Patient ID: Maria Mcpherson, female    DOB: 22-Feb-1956, 61 y.o.   MRN: [id number]  HPI   This is the case of Claudelle Mcpherson, 61 y.o. Female, who was referred by Dr. Tedra Senegal in consultation regarding possible OSA.   As you very well know, patient Has hypersomnia, frequent awakening during the night, unrefreshed sleep, snoring, gasping, choking. Symptoms started when she hit menopause. Denies abnormal behavior and sleep. She usually gets sleepy at night when she is reading. Sleepiness affects her functionality. She hasn't had a sleep study. She's had difficult to control blood pressure, usually in the morning when she wakes up. She feels her heart is racing when she wakes up in the middle of the night.  Her husband has sleep apnea and he uses CPAP therapy. She anticipates she would not have any problems with CPAP.  ESS is 9cm.   ROV (01/28/16)  Patient returns to the office as follow-up on her sleep apnea. She  Had a  had a home sleep study done in March which shows moderate sleep apnea with AHI of 22. Since that time,she has received her machine and feels better using it. No issues.More energy Less sleepiness. Download for June shows 100% compliance, AHI of 1.4. Heart rate issue is better. Not sure if BP is better controled.   ROV 08/03/16 Patient returns to the office as follow-up on her sleep apnea. Since last seen, she has been using her CPAP machine. Feels better using it. More energy. Less sleepiness. Feels benefit of CPAP. She has nasal pillows. Download the last month:  97%, AHI 1.1  Review of Systems  Constitutional: Negative.  Negative for fever and unexpected weight change.  HENT: Negative.  Negative for congestion, dental problem, ear pain, nosebleeds, postnasal drip, rhinorrhea, sinus pressure, sneezing, sore throat and trouble swallowing.   Eyes: Negative.  Negative for redness and itching.  Respiratory: Negative for cough, chest tightness, shortness of breath  and wheezing.   Cardiovascular: Negative.  Negative for palpitations and leg swelling.  Gastrointestinal: Negative.  Negative for nausea and vomiting.  Endocrine: Negative.   Genitourinary: Negative.  Negative for dysuria.  Musculoskeletal: Negative.  Negative for joint swelling.  Skin: Negative.  Negative for rash.  Allergic/Immunologic: Negative.   Neurological: Negative.  Negative for headaches.  Hematological: Negative.  Does not bruise/bleed easily.  Psychiatric/Behavioral: Negative.  Negative for dysphoric mood. The patient is not nervous/anxious.   All other systems reviewed and are negative.    Objective:   Physical Exam Vitals:  Vitals:   08/03/16 1227  BP: 110/70  Pulse: (!) 59  SpO2: 99%  Weight: 185 lb (83.9 kg)  Height: 5' 6.5" (1.689 m)    Constitutional/General:  Pleasant, well-nourished, well-developed, not in any distress,  Comfortably seating.  Well kempt  Body mass index is 29.41 kg/m. Wt Readings from Last 3 Encounters:  08/03/16 185 lb (83.9 kg)  03/10/16 188 lb (85.3 kg)  01/28/16 186 lb 9.6 oz (84.6 kg)     HEENT: Pupils equal and reactive to light and accommodation. Anicteric sclerae. Normal nasal mucosa.   No oral  lesions,  mouth clear,  oropharynx clear, no postnasal drip. (-) Oral thrush. No dental caries.  Airway - Mallampati class III  Neck: No masses. Midline trachea. No JVD, (-) LAD. (-) bruits appreciated.  Respiratory/Chest: Grossly normal chest. (-) deformity. (-) Accessory muscle use.  Symmetric expansion. (-) Tenderness on palpation.  Resonant on percussion.  Diminished BS on both  lower lung zones. (-) wheezing, crackles, rhonchi (-) egophony  Cardiovascular: Regular rate and  rhythm, heart sounds normal, no murmur or gallops, no peripheral edema  Gastrointestinal:  Normal bowel sounds. Soft, non-tender. No hepatosplenomegaly.  (-) masses.   Musculoskeletal:  Normal muscle tone. Normal gait.   Extremities: Grossly normal.  (-) clubbing, cyanosis.  (-) edema  Skin: (-) rash,lesions seen.   Neurological/Psychiatric : alert, oriented to time, place, person. Normal mood and affect             Assessment & Plan:  OSA (obstructive sleep apnea) Patient has hypersomnia. Hypersomnia affected her functionality. 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 Home sleep study done in March 2017:AHI 22. She got her CPAP machine in May 2017. She feels better using it. Less sleepiness. More energy. Hypersomnia does not affect her functionality anymore. No issues with it. Uses nasal pillows. Download from Jan 2018 : 97%, AHI 1.1.    We extensively discussed the importance of treating OSA and the need to use PAP therapy.   Continue with auto CPAP 5-10 cm water.   Patient was instructed to have mask, tubings, filter, reservoir cleaned at least once a week with soapy water.  Patient was instructed to call the office if he/she is having issues with the PAP device.    I advised patient to obtain sufficient amount of sleep --  7 to 8 hours at least in a 24 hr period.  Patient was advised to follow good sleep hygiene.  Patient was advised NOT to engage in activities requiring concentration and/or vigilance if he/she is and  sleepy.  Patient is NOT to drive if he/she is sleepy.    Patient will follow up with me in 1 year.     Monica Becton, MD 1/10/20181:04 PM [facility] [place] Pager: [phone] Office: [phone], Fax: [phone]

## 2016-08-03 NOTE — Assessment & Plan Note (Signed)
Patient has hypersomnia. Hypersomnia affected her functionality. 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 Home sleep study done in March 2017:AHI 22. She got her CPAP machine in May 2017. She feels better using it. Less sleepiness. More energy. Hypersomnia does not affect her functionality anymore. No issues with it. Uses nasal pillows. Download from Jan 2018 : 97%, AHI 1.1.    We extensively discussed the importance of treating OSA and the need to use PAP therapy.   Continue with auto CPAP 5-10 cm water.   Patient was instructed to have mask, tubings, filter, reservoir cleaned at least once a week with soapy water.  Patient was instructed to call the office if he/she is having issues with the PAP device.    I advised patient to obtain sufficient amount of sleep --  7 to 8 hours at least in a 24 hr period.  Patient was advised to follow good sleep hygiene.  Patient was advised NOT to engage in activities requiring concentration and/or vigilance if he/she is and  sleepy.  Patient is NOT to drive if he/she is sleepy.

## 2016-08-03 NOTE — Patient Instructions (Addendum)
  It was a pleasure taking care of you today!  Continue using your CPAP machine.   Please make sure you use your CPAP device everytime you sleep.  We will monitor the usage of your machine per your insurance requirement.  Your insurance company may take the machine from you if you are not using it regularly.   Please clean the mask, tubings, filter, water reservoir with soapy water every week.  Please use distilled water for the water reservoir.   Please call the office or your machine provider (DME company) if you are having issues with the device.   Return to clinic in 1 year  with Dr. De Dios    

## 2016-09-08 ENCOUNTER — Other Ambulatory Visit: Payer: Self-pay | Admitting: Internal Medicine

## 2016-09-08 ENCOUNTER — Ambulatory Visit (INDEPENDENT_AMBULATORY_CARE_PROVIDER_SITE_OTHER): Payer: 59 | Admitting: Physician Assistant

## 2016-09-14 ENCOUNTER — Ambulatory Visit (INDEPENDENT_AMBULATORY_CARE_PROVIDER_SITE_OTHER): Payer: 59 | Admitting: Internal Medicine

## 2016-09-14 ENCOUNTER — Encounter: Payer: Self-pay | Admitting: Internal Medicine

## 2016-09-14 VITALS — BP 140/100 | Temp 97.3°F

## 2016-09-14 DIAGNOSIS — R030 Elevated blood-pressure reading, without diagnosis of hypertension: Secondary | ICD-10-CM | POA: Diagnosis not present

## 2016-09-14 DIAGNOSIS — J22 Unspecified acute lower respiratory infection: Secondary | ICD-10-CM | POA: Diagnosis not present

## 2016-09-14 DIAGNOSIS — I10 Essential (primary) hypertension: Secondary | ICD-10-CM | POA: Diagnosis not present

## 2016-09-14 MED ORDER — HYDROCODONE-HOMATROPINE 5-1.5 MG/5ML PO SYRP: 5.0000 mL | ORAL_SOLUTION | Freq: Three times a day (TID) | ORAL | 0 refills | Status: DC | PRN

## 2016-09-14 MED ORDER — AZITHROMYCIN 250 MG PO TABS: ORAL_TABLET | ORAL | 0 refills | Status: DC

## 2016-09-14 NOTE — Patient Instructions (Signed)
Watch blood pressure of the next 2 weeks and call if persistently elevated. Zithromax Z-PAK take 2 tablets day 1 followed by 1 tablet days 2 through 5. Hycodan 1 teaspoon by mouth every 8 hours as needed for cough. Rest and drink plenty of fluids.

## 2016-09-14 NOTE — Progress Notes (Signed)
   Subjective:    Patient ID: Maria Mcpherson, female    DOB: 03-18-1956, 61 y.o.   MRN: [id number]  HPI 61 year old Female with cough and congestion for 4 weeks. She got a little frightened because of the protracted deep cough. It is of particular bothersome at night. No fever or shaking chills. No myalgias or other flulike symptoms. Has not had a respiratory infection in a number of years. Slight sputum production but not discolored. No sore throat. No headache.    Review of Systems See above    Objective:   Physical Exam Skin warm and dry. Nodes none. TMs are clear. Pharynx is clear. Neck is supple without adenopathy. Chest clear to auscultation without rales or wheezing       Assessment & Plan:  Acute bronchitis  Plan: Zithromax Z-PAK take 2 tablets day 1 followed by 1 tablet days 2 through 5. Hycodan 1 teaspoon by mouth every 8 hours when necessary cough. Rest and drink plenty of fluids.  Her blood pressure is elevated today. She does have a history of essential hypertension. She is to monitor it at home and call me if persistently elevated in the next couple of weeks.

## 2016-11-27 ENCOUNTER — Other Ambulatory Visit: Payer: Self-pay | Admitting: Internal Medicine

## 2016-12-20 ENCOUNTER — Encounter: Payer: Self-pay | Admitting: Internal Medicine

## 2016-12-20 ENCOUNTER — Ambulatory Visit (INDEPENDENT_AMBULATORY_CARE_PROVIDER_SITE_OTHER): Payer: 59 | Admitting: Internal Medicine

## 2016-12-20 VITALS — BP 158/100 | HR 67 | Temp 97.2°F | Ht 65.5 in | Wt 182.0 lb

## 2016-12-20 DIAGNOSIS — I10 Essential (primary) hypertension: Secondary | ICD-10-CM

## 2016-12-20 MED ORDER — FUROSEMIDE 40 MG PO TABS: 40.0000 mg | ORAL_TABLET | Freq: Every day | ORAL | 3 refills | Status: DC

## 2016-12-20 MED ORDER — LOSARTAN POTASSIUM 100 MG PO TABS: 100.0000 mg | ORAL_TABLET | Freq: Every day | ORAL | 3 refills | Status: DC

## 2016-12-20 NOTE — Patient Instructions (Signed)
Begin clear liquid diet at noon on Thursday, June 7th.  You may have any type of broths, soups, liquids (nothing red of any sort, no tomato soup, no red drink, etc.).  You may have soups, but no pasta in your soups.    At midnight, begin fasting.  You may take your medications on the morning of the test (6/8).  Arrive at the facility at 7:40.  Testing will begin at 8:00 a.m.    [address] [phone]  Do NOT void 30 minutes prior to arriving at the facility.  You must have a full bladder for this Ultrasound.  If you have any questions, please feel free to contact the facility with any questions.    Then, follow up with Dr. Renold Genta at the office on Friday afternoon on your blood pressure, 6/8 at 4:15 p.m.    If you have any questions, please contact the office at [phone].

## 2016-12-20 NOTE — Progress Notes (Signed)
   Subjective:    Patient ID: Maria Mcpherson, female    DOB: 08/01/1955, 61 y.o.   MRN: [id number]  HPI Patient with long-standing history of hypertension treated with metoprolol for many years. Recently she's noticed her blood pressures been elevated perhaps for about a month. She has gotten some fairly high readings at home in the 409-811 range systolically and around 914 diastolically. She's had some headaches recently.  She has a history of hypokalemia secondary to diuretic therapy. She is taking Lasix 20 mg daily for dependent edema and hypertension.  She had left hip arthroplasty February 2017.  In August 2017 her blood pressure was 128/86. I saw her in February 2018 for respiratory infection and blood pressure was 140/100. She had a lot of coughing at that time.  History of obstructive sleep apnea. Seen by pulmonologist initially in July 2017 and given a C Pap machine. She return for recheck with pulmonologist in  January 2018. Blood pressure was 110/70.  Denies being under excessive stress.    Review of Systems see above     Objective:   Physical Exam  Skin warm and dry. Blood pressure right arm 170/100 taken by myself. Blood pressure left arm taken by CMA 158/100. Neck is supple. No JVD thyromegaly or carotid bruits. Chest clear to auscultation. Cardiac exam regular rate and rhythm without murmurs or gallops. She has lower extremity edema that is nonpitting.     Assessment & Plan:  Elevated blood pressure on beta blocker and diuretic  Dependent edema  Plan: Patient will be placed on losartan 100 mg daily and will continue with beta blocker. Will increase Lasix to 40 mg daily. She'll return in a week. She's going to have of renal artery stenosis ultrasound. Basic metabolic panel drawn today.

## 2016-12-21 LAB — BASIC METABOLIC PANEL
BUN: 19 mg/dL (ref 7–25)
CO2: 28 mmol/L (ref 20–31)
Calcium: 9.4 mg/dL (ref 8.6–10.4)
Chloride: 104 mmol/L (ref 98–110)
Creat: 0.93 mg/dL (ref 0.50–1.05)
GLUCOSE: 87 mg/dL (ref 65–99)
POTASSIUM: 4 mmol/L (ref 3.5–5.3)
Sodium: 143 mmol/L (ref 135–146)

## 2016-12-30 ENCOUNTER — Ambulatory Visit (INDEPENDENT_AMBULATORY_CARE_PROVIDER_SITE_OTHER): Payer: 59 | Admitting: Internal Medicine

## 2016-12-30 ENCOUNTER — Ambulatory Visit
Admission: RE | Admit: 2016-12-30 | Discharge: 2016-12-30 | Disposition: A | Discharge status: Home / Self Care | Payer: 59 | Source: Ambulatory Visit | Attending: Internal Medicine | Admitting: Internal Medicine

## 2016-12-30 ENCOUNTER — Encounter: Payer: Self-pay | Admitting: Internal Medicine

## 2016-12-30 VITALS — BP 130/90 | HR 71 | Temp 98.3°F | Wt 179.0 lb

## 2016-12-30 DIAGNOSIS — R609 Edema, unspecified: Secondary | ICD-10-CM | POA: Diagnosis not present

## 2016-12-30 DIAGNOSIS — I10 Essential (primary) hypertension: Secondary | ICD-10-CM

## 2016-12-30 MED ORDER — AMLODIPINE BESYLATE 5 MG PO TABS: 5.0000 mg | ORAL_TABLET | Freq: Every day | ORAL | 0 refills | Status: DC

## 2017-01-19 NOTE — Patient Instructions (Signed)
Add amlodipine to current regimen and return in 3 weeks.

## 2017-01-19 NOTE — Progress Notes (Signed)
Subjective:    Patient ID: Maria Mcpherson, female    DOB: 1956-06-01, 61 y.o.   MRN: [id number]  HPI In today to follow-up on elevated blood pressure. Had been on metoprolol and HCTZ for number of years. In August 2017 was changed from HCTZ to Lasix 20 mg daily for dependent edema. In January 2018 was diagnosed with obstructive sleep apnea. Seen May 29 for elevated blood pressure in the 344-830 range systolically and around 159 diastolically. History of hypokalemia secondary to diuretic therapy. On May 29 we increased Lasix to 40 mg daily, continued beta blocker and started her losartan 100 mg daily. She's now here for follow-up.  B- met was normal on May 29. She is had a renal artery stenosis ultrasound showing no evidence of renal artery stenosis. Blood pressure has improved to 130/90 today. It had been 158/100 on May 29.  BMI is 29.33-needs to lose weight   Review of Systems     Objective:   Physical Exam Chest clear. Cardiac exam regular rate and rhythm normal S1 and S2. Extremities improved lower extremity edema with increased dose of Lasix       Assessment & Plan:  Elevated blood pressure  Dependent edema  No evidence of renal artery stenosis  Plan: Add Norvasc 5 mg daily to metoprolol 50 mg daily and losartan 100 mg daily. Continue Lasix 40 mg daily. Return in 3 weeks. Needs basic metabolic panel when she returns.

## 2017-01-20 ENCOUNTER — Ambulatory Visit (INDEPENDENT_AMBULATORY_CARE_PROVIDER_SITE_OTHER): Payer: 59 | Admitting: Internal Medicine

## 2017-01-20 ENCOUNTER — Encounter: Payer: Self-pay | Admitting: Internal Medicine

## 2017-01-20 VITALS — BP 110/68 | HR 64 | Temp 99.0°F | Wt 180.0 lb

## 2017-01-20 DIAGNOSIS — I10 Essential (primary) hypertension: Secondary | ICD-10-CM

## 2017-01-20 NOTE — Progress Notes (Signed)
   Subjective:    Patient ID: Maria Mcpherson, female    DOB: Jun 20, 1956, 61 y.o.   MRN: [id number]  HPI 61 year old female for follow-up of hypertension. She is on Lasix 40 mg daily, metoprolol 50 mg daily, potassium supplement 20 mEq daily, losartan 100 mg daily, amlodipine. Her dependent edema has improved considerably and her blood pressure readings that she brings in today are quite acceptable. Since June 8 they have come down considerably from 144/90-125/85 by June 18 and 110/77 by June 24. On June 28 blood pressure was 126/88. She had a stressful case this past month and build a lot of hours. She's now going to have some free time. She is going to keep an eye on her blood pressure and let me know if she's not feeling okay. We'll plan to be physical exam in September.  Ultrasound for renal artery stenosis was negative.  Basic metabolic panel at last visit was normal.    Review of Systems see above     Objective:   Physical Exam  Skin warm and dry. Nodes none. Chest clear to auscultation. Cardiac exam regular rate and rhythm normal S1 and S2. Extremities no pitting edema      Assessment & Plan:  Essential hypertension now stable  Plan: Return in September for physical exam and follow-up

## 2017-01-20 NOTE — Patient Instructions (Signed)
Continue medications as previously prescribed and follow-up for physical exam in September

## 2017-01-27 ENCOUNTER — Telehealth: Payer: Self-pay

## 2017-01-27 MED ORDER — LOSARTAN POTASSIUM 100 MG PO TABS: 100.0000 mg | ORAL_TABLET | Freq: Every day | ORAL | 0 refills | Status: DC

## 2017-01-27 NOTE — Telephone Encounter (Signed)
Received fax from [facility] in regards to a refill on Losartan 100mg  for patient. Medication was refilled per Dr. Verlene Mayer request. Sent 90 days

## 2017-03-02 ENCOUNTER — Other Ambulatory Visit: Payer: Self-pay | Admitting: Internal Medicine

## 2017-03-15 ENCOUNTER — Other Ambulatory Visit: Payer: Self-pay | Admitting: Internal Medicine

## 2017-03-21 ENCOUNTER — Other Ambulatory Visit: Payer: 59 | Admitting: Internal Medicine

## 2017-03-23 ENCOUNTER — Other Ambulatory Visit: Payer: Self-pay | Admitting: Internal Medicine

## 2017-03-23 ENCOUNTER — Other Ambulatory Visit: Payer: 59 | Admitting: Internal Medicine

## 2017-03-23 DIAGNOSIS — E669 Obesity, unspecified: Secondary | ICD-10-CM

## 2017-03-23 DIAGNOSIS — R7302 Impaired glucose tolerance (oral): Secondary | ICD-10-CM

## 2017-03-23 DIAGNOSIS — Z1329 Encounter for screening for other suspected endocrine disorder: Secondary | ICD-10-CM

## 2017-03-23 DIAGNOSIS — I10 Essential (primary) hypertension: Secondary | ICD-10-CM

## 2017-03-23 DIAGNOSIS — Z1321 Encounter for screening for nutritional disorder: Secondary | ICD-10-CM

## 2017-03-23 DIAGNOSIS — E8881 Metabolic syndrome: Secondary | ICD-10-CM

## 2017-03-23 DIAGNOSIS — E785 Hyperlipidemia, unspecified: Secondary | ICD-10-CM

## 2017-03-23 DIAGNOSIS — E876 Hypokalemia: Secondary | ICD-10-CM

## 2017-03-23 DIAGNOSIS — Z0000 Encounter for general adult medical examination without abnormal findings: Secondary | ICD-10-CM

## 2017-03-23 LAB — CBC WITH DIFFERENTIAL/PLATELET
BASOS ABS: 43 {cells}/uL (ref 0–200)
Basophils Relative: 1 %
EOS ABS: 301 {cells}/uL (ref 15–500)
Eosinophils Relative: 7 %
HEMATOCRIT: 41.2 % (ref 35.0–45.0)
HEMOGLOBIN: 13.6 g/dL (ref 11.7–15.5)
LYMPHS ABS: 1333 {cells}/uL (ref 850–3900)
Lymphocytes Relative: 31 %
MCH: 30 pg (ref 27.0–33.0)
MCHC: 33 g/dL (ref 32.0–36.0)
MCV: 90.9 fL (ref 80.0–100.0)
MONO ABS: 344 {cells}/uL (ref 200–950)
MPV: 10.6 fL (ref 7.5–12.5)
Monocytes Relative: 8 %
NEUTROS PCT: 53 %
Neutro Abs: 2279 cells/uL (ref 1500–7800)
Platelets: 257 10*3/uL (ref 140–400)
RBC: 4.53 MIL/uL (ref 3.80–5.10)
RDW: 14.8 % (ref 11.0–15.0)
WBC: 4.3 10*3/uL (ref 3.8–10.8)

## 2017-03-24 ENCOUNTER — Other Ambulatory Visit (HOSPITAL_COMMUNITY)
Admission: RE | Admit: 2017-03-24 | Discharge: 2017-03-24 | Disposition: A | Discharge status: Home / Self Care | Payer: 59 | Source: Ambulatory Visit | Attending: Internal Medicine | Admitting: Internal Medicine

## 2017-03-24 ENCOUNTER — Encounter: Payer: Self-pay | Admitting: Internal Medicine

## 2017-03-24 ENCOUNTER — Ambulatory Visit (INDEPENDENT_AMBULATORY_CARE_PROVIDER_SITE_OTHER): Payer: 59 | Admitting: Internal Medicine

## 2017-03-24 VITALS — BP 108/60 | HR 72 | Temp 98.5°F | Ht 66.0 in | Wt 185.0 lb

## 2017-03-24 DIAGNOSIS — Z0000 Encounter for general adult medical examination without abnormal findings: Secondary | ICD-10-CM

## 2017-03-24 DIAGNOSIS — R609 Edema, unspecified: Secondary | ICD-10-CM

## 2017-03-24 DIAGNOSIS — E782 Mixed hyperlipidemia: Secondary | ICD-10-CM

## 2017-03-24 DIAGNOSIS — R7302 Impaired glucose tolerance (oral): Secondary | ICD-10-CM | POA: Diagnosis not present

## 2017-03-24 DIAGNOSIS — E559 Vitamin D deficiency, unspecified: Secondary | ICD-10-CM | POA: Diagnosis not present

## 2017-03-24 DIAGNOSIS — Z01419 Encounter for gynecological examination (general) (routine) without abnormal findings: Secondary | ICD-10-CM | POA: Insufficient documentation

## 2017-03-24 DIAGNOSIS — G4733 Obstructive sleep apnea (adult) (pediatric): Secondary | ICD-10-CM | POA: Diagnosis not present

## 2017-03-24 DIAGNOSIS — I10 Essential (primary) hypertension: Secondary | ICD-10-CM

## 2017-03-24 DIAGNOSIS — E8881 Metabolic syndrome: Secondary | ICD-10-CM | POA: Diagnosis not present

## 2017-03-24 LAB — LIPID PANEL
Cholesterol: 222 mg/dL — ABNORMAL HIGH (ref <200)
HDL: 74 mg/dL (ref >50)
LDL Cholesterol: 115 mg/dL — ABNORMAL HIGH (ref <100)
Total CHOL/HDL Ratio: 3 Ratio (ref <5.0)
Triglycerides: 166 mg/dL — ABNORMAL HIGH (ref <150)
VLDL: 33 mg/dL — ABNORMAL HIGH (ref <30)

## 2017-03-24 LAB — COMPLETE METABOLIC PANEL WITH GFR
ALT: 21 U/L (ref 6–29)
AST: 23 U/L (ref 10–35)
Albumin: 4.2 g/dL (ref 3.6–5.1)
Alkaline Phosphatase: 73 U/L (ref 33–130)
BILIRUBIN TOTAL: 0.8 mg/dL (ref 0.2–1.2)
BUN: 22 mg/dL (ref 7–25)
CALCIUM: 9.6 mg/dL (ref 8.6–10.4)
CO2: 25 mmol/L (ref 20–32)
CREATININE: 0.93 mg/dL (ref 0.50–0.99)
Chloride: 101 mmol/L (ref 98–110)
GFR, EST AFRICAN AMERICAN: 77 mL/min (ref >=60)
GFR, Est Non African American: 67 mL/min (ref >=60)
Glucose, Bld: 92 mg/dL (ref 65–99)
Potassium: 4.1 mmol/L (ref 3.5–5.3)
Sodium: 143 mmol/L (ref 135–146)
TOTAL PROTEIN: 6.7 g/dL (ref 6.1–8.1)

## 2017-03-24 LAB — TSH: TSH: 0.88 m[IU]/L

## 2017-03-24 LAB — HEMOGLOBIN A1C
HEMOGLOBIN A1C: 5.8 % — AB (ref <5.7)
MEAN PLASMA GLUCOSE: 120 mg/dL

## 2017-03-24 LAB — POCT URINALYSIS DIPSTICK
Bilirubin, UA: NEGATIVE
Blood, UA: NEGATIVE
Glucose, UA: NEGATIVE
Ketones, UA: NEGATIVE
Leukocytes, UA: NEGATIVE
Nitrite, UA: NEGATIVE
Protein, UA: NEGATIVE
Spec Grav, UA: 1.015 (ref 1.010–1.025)
Urobilinogen, UA: 0.2 E.U./dL
pH, UA: 5 (ref 5.0–8.0)

## 2017-03-24 LAB — VITAMIN D 25 HYDROXY (VIT D DEFICIENCY, FRACTURES): VIT D 25 HYDROXY: 29 ng/mL — AB (ref 30–100)

## 2017-03-24 LAB — MICROALBUMIN / CREATININE URINE RATIO: CREATININE, URINE: 17 mg/dL — AB (ref 20–320)

## 2017-03-24 MED ORDER — AMLODIPINE BESYLATE 5 MG PO TABS: 5.0000 mg | ORAL_TABLET | Freq: Every day | ORAL | 3 refills | Status: DC

## 2017-03-24 NOTE — Progress Notes (Signed)
Subjective:    Patient ID: Maria Mcpherson, female    DOB: 1956/03/24, 61 y.o.   MRN: [id number]  HPI  61 year old Female for health maintenance examAnd evaluation of medical issues.  Review of lab work shows that she has vitamin D deficiency. Level is slightly low at 29 and was normal last year. Needs to take 2000 units vitamin D 3 daily.  We had an issue recently in June with uncontrolled hypertension. Is now back down to within normal limits after adding amlodipine and Lasix. She continues to monitor blood pressure at home. Says it's running mostly 366 and 440 systolically and she feels okay. She's taking Lasix 40 mg daily. Despite this, is having some lower extremity edema this summer but not excessive. We are going to see if that gets better this fall when the weather is cooler.  Renal artery stenosis was ruled out in June  History of obstructive sleep apnea  In 16-Sep-2015, she had left hip arthroplasty by Dr. Ninfa Linden.   Hemoglobin A1c is 5.8% and was exactly the same as it was a year ago.  She is on diuretic therapy and her potassium is normal at 4.1. She is on potassium supplement  Liver functions are normal.  Says she's not been following a low-fat diet.    She is taking metformin for impaired glucose tolerance.  She's taking Zocor 40 mg daily. Despite this her lipid panel is elevated. She's not been watching her diet. She prefers not to increase Zocor at this point in time. Total cholesterol is 222 and previously was 185. Triglycerides have gone up from 133-166. LDL cholesterol has increased from 85 to 115  History of basal cell carcinoma left lateral nose removed with Mohs procedure.  History of fibrocystic breast disease.  In 2013/09/15 she had postmenopausal bleeding and saw Dr. Edwinna Areola. She had endometrial biopsy that showed proliferative endometrium. She was found to have several fibroids. These are being watched conservatively. She had a Pap smear in 15-Sep-2013 and  this was repeated today.  History of pulmonary embolus September 2006 after removal of a large ovarian fibroma which apparently got twisted with part of the ovarian vein resulting in a pulmonary embolus. She was followed on Coumadin here for several months. She's never had a recurrence. She's only had right oophorectomy not a complete hysterectomy.  History of tonsillectomy and adenoidectomy 1961/09/15.  Colonoscopy April 2011 with 10 year follow-up advised.  Had boned density study September 15, 2009.  Has had vaccinations for hepatitis A, hepatitis B, MMR and typhoid due to foreign travel.  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 She is married. Son passed away 09-16-2007. Daughter with history of alcohol abuse. Has been in rehabilitation. Patient does not smoke or consume alcohol.  Family history: Mother with history of breast cancer and hypertension. Maternal grandmother with history of stroke. Father with history of leukemia. 2 brothers in good health. One sister in good health.  From 2015-2016 she gained 7 pounds. From 2016-2017 she gained 11 pounds. Has lost 3 pounds since last years physical exam.          Review of Systems  Constitutional: Negative.   Respiratory: Negative.   Cardiovascular: Positive for leg swelling.  Gastrointestinal: Negative.   Genitourinary: Negative.   Musculoskeletal: Negative.   Psychiatric/Behavioral: Negative.        Objective:   Physical Exam  Constitutional: She is oriented to person, place, and time. She  appears well-developed and well-nourished. No distress.  HENT:  Head: Normocephalic and atraumatic.  Right Ear: External ear normal.  Left Ear: External ear normal.  Mouth/Throat: Oropharynx is clear and moist.  Eyes: Pupils are equal, round, and reactive to light. Conjunctivae and EOM are normal. Right eye exhibits no discharge. Left eye exhibits no discharge. No scleral icterus.  Neck: Neck supple. No JVD  present. No thyromegaly present.  Cardiovascular: Normal rate, regular rhythm, normal heart sounds and intact distal pulses.   No murmur heard. Pulmonary/Chest: Effort normal and breath sounds normal. No respiratory distress. She has no wheezes. She has no rales.  Abdominal: Soft. Bowel sounds are normal. She exhibits no distension and no mass. There is no tenderness. There is no rebound and no guarding.  Genitourinary:  Genitourinary Comments: Pap taken. Uterus slightly enlarged. History of fibroids  Musculoskeletal: She exhibits no edema.  Lymphadenopathy:    She has no cervical adenopathy.  Neurological: She is alert and oriented to person, place, and time. She has normal reflexes. No cranial nerve deficit. Coordination normal.  Skin: Skin is warm and dry. No rash noted. She is not diaphoretic.  Psychiatric: She has a normal mood and affect. Her behavior is normal. Judgment and thought content normal.  Vitals reviewed.         Assessment & Plan:  Essential hypertension-stable on the regimen since June  Dependent edema-see if this improves with cooler weather continue diuretic  History of hypokalemia-serum potassium is within normal limits on potassium supplementation  Hyperlipidemia-needs to work on diet exercise and weight loss. Continue statin medication  Status post left hip arthroplasty February 2017  Obesity  Snoring-has C Pap machine  Remote history of pulmonary embolus related to GYN surgery  Health maintenance-will get flu shot at work  Plan: Recommend annual mammogram and flu vaccine. Return in 6 months. Work on diet exercise and weight loss.

## 2017-03-24 NOTE — Patient Instructions (Addendum)
It was a pleasure to see you today. Continue to work on diet exercise and weight loss efforts. Have annual mammogram. Take 2000 units vitamin D 3 daily. Continue same medications and see if dependent edema improves with cooler weather. Return in 6 months

## 2017-03-29 LAB — CYTOLOGY - PAP
Adequacy: ABSENT
DIAGNOSIS: NEGATIVE

## 2017-04-12 ENCOUNTER — Encounter: Payer: Self-pay | Admitting: Internal Medicine

## 2017-05-12 ENCOUNTER — Other Ambulatory Visit: Payer: Self-pay | Admitting: Internal Medicine

## 2017-05-13 ENCOUNTER — Other Ambulatory Visit: Payer: Self-pay | Admitting: Internal Medicine

## 2017-06-07 ENCOUNTER — Other Ambulatory Visit: Payer: Self-pay | Admitting: Internal Medicine

## 2017-06-07 NOTE — Telephone Encounter (Signed)
Prn one year 

## 2017-06-19 ENCOUNTER — Telehealth: Payer: Self-pay | Admitting: Adult Health

## 2017-06-19 NOTE — Telephone Encounter (Signed)
Received fax from [facility] about pt's CPAP. Pt was last seen by Dr. Murlean Iba in 07/2016. Pt will need yearly follow up. Will need to schedule pt to see TP as ROV.   LMTCB for pt.

## 2017-06-19 NOTE — Telephone Encounter (Signed)
Spoke to pt - she has scheduled appt 06/21/17 @ 9:15 w/ TP nothing further needed -pr

## 2017-06-21 ENCOUNTER — Ambulatory Visit (INDEPENDENT_AMBULATORY_CARE_PROVIDER_SITE_OTHER): Payer: BLUE CROSS/BLUE SHIELD | Admitting: Adult Health

## 2017-06-21 ENCOUNTER — Encounter: Payer: Self-pay | Admitting: Adult Health

## 2017-06-21 DIAGNOSIS — G4733 Obstructive sleep apnea (adult) (pediatric): Secondary | ICD-10-CM

## 2017-06-21 NOTE — Progress Notes (Signed)
@Patient  ID: Maria Mcpherson, female    DOB: 05-28-1956, 61 y.o.   MRN: [id number]  Chief Complaint  Patient presents with  . Follow-up    OSA     Referring provider: Elby Showers, MD  HPI: 61 year old female followed for moderate sleep apnea on CPAP.  Test Home sleep study March 2017> AHI of 22  06/21/2017 follow-up: OSA Patient returns for a follow-up for obstructive sleep apnea.  Patient is on CPAP at bedtime.  Patient says she feels rested with no significant daytime sleepiness.  She uses her machine every night.  Download shows compliance with average usage at 7.5 hours.  Patient is on AutoSet 5-10 cm H2O.  AHI 0.8.  Minimal leaks.    Allergies  Allergen Reactions  . Ampicillin Rash    Has patient had a PCN reaction causing immediate rash, facial/tongue/throat swelling, SOB or lightheadedness with hypotension: Unsure Has patient had a PCN reaction causing severe rash involving mucus membranes or skin necrosis: No Has patient had a PCN reaction that required hospitalization No Has patient had a PCN reaction occurring within the last 10 years: No If all of the above answers are "NO", then may proceed with Cephalosporin use.    Immunization History  Administered Date(s) Administered  . Influenza Split 04/18/2012, 05/02/2013, 04/29/2015, 04/24/2017  . Influenza-Unspecified 04/24/2014, 05/04/2016  . Tdap 09/08/2010    Past Medical History:  Diagnosis Date  . Arthritis   . Borderline diabetes   . Fibrocystic breast disease   . Heart murmur    very slight  . History of nonmelanoma skin cancer   . HTN (hypertension)   . Hyperlipidemia   . Hypokalemia   . pulmonary emboli 10/06   same time as oophrectomy/myomectomy, evlauation was negative  . Sleep apnea     Tobacco History: Social History   Tobacco Use  Smoking Status Never Smoker  Smokeless Tobacco Never Used   Counseling given: Not Answered   Outpatient Encounter Medications as of 06/21/2017    Medication Sig  . amLODipine (NORVASC) 5 MG tablet Take 1 tablet (5 mg total) by mouth daily.  Marland Kitchen aspirin 81 MG tablet Take 81 mg by mouth daily.  Marland Kitchen BIOTIN PO Take 2 capsules by mouth daily.  . calcium carbonate (OS-CAL) 600 MG TABS Take 600 mg by mouth 2 (two) times daily with a meal.    . fish oil-omega-3 fatty acids 1000 MG capsule Take 1 g by mouth daily.    . furosemide (LASIX) 40 MG tablet Take 1 tablet (40 mg total) by mouth daily.  Marland Kitchen loratadine (CLARITIN) 10 MG tablet Take 10 mg by mouth daily.  Marland Kitchen losartan (COZAAR) 100 MG tablet TAKE 1 TABLET BY MOUTH EVERY DAY  . metFORMIN (GLUCOPHAGE) 500 MG tablet TAKE 1 TABLET BY MOUTH  EVERY DAY WITH A MEAL  . metoprolol tartrate (LOPRESSOR) 50 MG tablet TAKE 1 TABLET BY MOUTH  DAILY  . Nutritional Supplements (JUICE PLUS FIBRE PO) Take 1 tablet by mouth daily.   . potassium chloride SA (K-DUR,KLOR-CON) 20 MEQ tablet TAKE 1 TABLET BY MOUTH TWO  TIMES DAILY  . Propylene Glycol (SYSTANE BALANCE) 0.6 % SOLN Apply 1 drop to eye 3 (three) times daily as needed (Dry eyes).  . simvastatin (ZOCOR) 40 MG tablet TAKE 1 TABLET BY MOUTH AT  BEDTIME   No facility-administered encounter medications on file as of 06/21/2017.      Review of Systems  Constitutional:   No  weight loss, night sweats,  Fevers, chills, fatigue, or  lassitude.  HEENT:   No headaches,  Difficulty swallowing,  Tooth/dental problems, or  Sore throat,                No sneezing, itching, ear ache, nasal congestion, post nasal drip,   CV:  No chest pain,  Orthopnea, PND, swelling in lower extremities, anasarca, dizziness, palpitations, syncope.   GI  No heartburn, indigestion, abdominal pain, nausea, vomiting, diarrhea, change in bowel habits, loss of appetite, bloody stools.   Resp: No shortness of breath with exertion or at rest.  No excess mucus, no productive cough,  No non-productive cough,  No coughing up of blood.  No change in color of mucus.  No wheezing.  No chest wall  deformity  Skin: no rash or lesions.  GU: no dysuria, change in color of urine, no urgency or frequency.  No flank pain, no hematuria   MS:  No joint pain or swelling.  No decreased range of motion.  No back pain.    Physical Exam  BP 124/74 (BP Location: Left Arm, Cuff Size: Normal)   Pulse 64   Ht 5' 6.5" (1.689 m)   Wt 189 lb 3.2 oz (85.8 kg)   LMP 12/04/2013   SpO2 99%   BMI 30.08 kg/m   GEN: A/Ox3; pleasant , NAD, overweight   HEENT:  [place]/AT,  EACs-clear, TMs-wnl, NOSE-clear, THROAT-clear, no lesions, no postnasal drip or exudate noted.  Class II-III MP airway  NECK:  Supple w/ fair ROM; no JVD; normal carotid impulses w/o bruits; no thyromegaly or nodules palpated; no lymphadenopathy.    RESP  Clear  P & A; w/o, wheezes/ rales/ or rhonchi. no accessory muscle use, no dullness to percussion  CARD:  RRR, no m/r/g, no peripheral edema, pulses intact, no cyanosis or clubbing.  GI:   Soft & nt; nml bowel sounds; no organomegaly or masses detected.   Musco: Warm bil, no deformities or joint swelling noted.   Neuro: alert, no focal deficits noted.    Skin: Warm, no lesions or rashes    Lab Results:  CBC  BNP No results found for: BNP  ProBNP No results found for: PROBNP  Imaging: No results found.   Assessment & Plan:   No problem-specific Assessment & Plan notes found for this encounter.     Rexene Edison, NP 06/21/2017

## 2017-06-21 NOTE — Progress Notes (Signed)
I have reviewed and agree with assessment/plan.  Chesley Mires, MD [facility]Critical [facility] 06/21/2017, 10:50 AM Pager:  [phone]

## 2017-06-21 NOTE — Patient Instructions (Signed)
Continue on CPAP at bedtime Keep up the good work Work on [employer] Do not drive sleeping Follow-up in 1 year with Dr. Halford Chessman and As needed

## 2017-06-21 NOTE — Assessment & Plan Note (Signed)
Moderate sleep apnea well-controlled on CPAP  Plan  Patient Instructions  Continue on CPAP at bedtime Keep up the good work Work on [employer] Do not drive sleeping Follow-up in 1 year with Dr. Halford Chessman and As needed

## 2017-06-21 NOTE — Assessment & Plan Note (Signed)
Wt loss  

## 2017-08-23 ENCOUNTER — Other Ambulatory Visit: Payer: Self-pay

## 2017-08-23 DIAGNOSIS — E785 Hyperlipidemia, unspecified: Secondary | ICD-10-CM

## 2017-09-19 ENCOUNTER — Other Ambulatory Visit: Payer: BLUE CROSS/BLUE SHIELD | Admitting: Internal Medicine

## 2017-09-19 DIAGNOSIS — E785 Hyperlipidemia, unspecified: Secondary | ICD-10-CM

## 2017-09-19 LAB — LIPID PANEL
Cholesterol: 222 mg/dL — ABNORMAL HIGH (ref <200)
HDL: 68 mg/dL (ref >50)
LDL Cholesterol (Calc): 126 mg/dL (calc) — ABNORMAL HIGH
Non-HDL Cholesterol (Calc): 154 mg/dL (calc) — ABNORMAL HIGH (ref <130)
Total CHOL/HDL Ratio: 3.3 (calc) (ref <5.0)
Triglycerides: 161 mg/dL — ABNORMAL HIGH (ref <150)

## 2017-09-21 ENCOUNTER — Encounter: Payer: Self-pay | Admitting: Internal Medicine

## 2017-09-21 ENCOUNTER — Ambulatory Visit (INDEPENDENT_AMBULATORY_CARE_PROVIDER_SITE_OTHER): Payer: BLUE CROSS/BLUE SHIELD | Admitting: Internal Medicine

## 2017-09-21 VITALS — BP 100/80 | HR 68 | Ht 66.5 in | Wt 190.0 lb

## 2017-09-21 DIAGNOSIS — I10 Essential (primary) hypertension: Secondary | ICD-10-CM

## 2017-09-21 DIAGNOSIS — R609 Edema, unspecified: Secondary | ICD-10-CM

## 2017-09-21 DIAGNOSIS — E782 Mixed hyperlipidemia: Secondary | ICD-10-CM

## 2017-09-21 DIAGNOSIS — E8881 Metabolic syndrome: Secondary | ICD-10-CM | POA: Diagnosis not present

## 2017-09-21 NOTE — Progress Notes (Signed)
   Subjective:    Patient ID: Maria Mcpherson, female    DOB: Mar 09, 1956, 62 y.o.   MRN: [id number]  HPI 62 year old Female with history of hyperlipidemia, obstructive sleep apnea, hypertension, vitamin D deficiency, dependent edema, and impaired glucose tolerance for 76-month recheck.  In August her total cholesterol had increased from 185-222.  Triglycerides had increased from 133-166.  LDL cholesterol had increased from 85-115.  She admitted she had not watching her diet at the time and did not want to increase Zocor.  Weight at that time was 185 pounds and is now 190 pounds.  BMI is 30.21.  We had a discussion about diet exercise and weight loss today.  She is tried [employer] previously.  Has not been trying very hard recently.  Has been traveling some.  She has a new position with the [employer] and will be traveling about 1 week a month.  Continues to Scientist, research (life sciences).  Has a new grandchild 32 months old.      Review of Systems see above no new complaints     Objective:   Physical Exam Neck supple without thyromegaly or carotid bruits.  Chest clear.  Cardiac exam soft regular rate and rhythm.  Extremities: Trace nonpitting edema       Assessment & Plan:  Hyperlipidemia-we will continue with current dose of statin and reevaluate in 6 months.  Elevated BMI-she is to work on diet exercise and weight loss  Essential hypertension-stable on current regimen  Lower extremity edema-improved with diuretic therapy  Plan: Physical exam due in 6 months

## 2017-09-21 NOTE — Patient Instructions (Signed)
It was a pleasure to see you today.  Please work on diet exercise and weight loss and return in 6 months.  Continue same medications.

## 2017-10-31 DIAGNOSIS — D225 Melanocytic nevi of trunk: Secondary | ICD-10-CM | POA: Diagnosis not present

## 2017-10-31 DIAGNOSIS — D485 Neoplasm of uncertain behavior of skin: Secondary | ICD-10-CM | POA: Diagnosis not present

## 2017-10-31 DIAGNOSIS — D1800 Hemangioma unspecified site: Secondary | ICD-10-CM | POA: Diagnosis not present

## 2017-10-31 DIAGNOSIS — Z85828 Personal history of other malignant neoplasm of skin: Secondary | ICD-10-CM | POA: Diagnosis not present

## 2017-10-31 DIAGNOSIS — L814 Other melanin hyperpigmentation: Secondary | ICD-10-CM | POA: Diagnosis not present

## 2017-11-15 DIAGNOSIS — G4733 Obstructive sleep apnea (adult) (pediatric): Secondary | ICD-10-CM | POA: Diagnosis not present

## 2017-12-08 ENCOUNTER — Other Ambulatory Visit: Payer: Self-pay | Admitting: Internal Medicine

## 2018-01-22 IMAGING — US US RENAL
1 series · 13 of 25 positions shown · non-contrast
Comparison: 04/14/2005 CT with contrast

CLINICAL DATA: Hypertension

EXAM:
RENAL/URINARY TRACT ULTRASOUND
RENAL DUPLEX DOPPLER ULTRASOUND

[Series 1: us renal · 0.26mm/px · 13 of 86 slices shown]
[im 1/86]
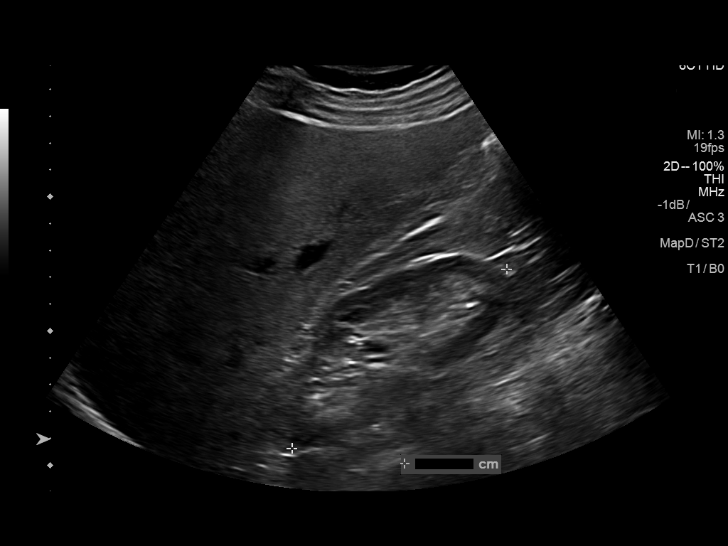
[im 8/86]
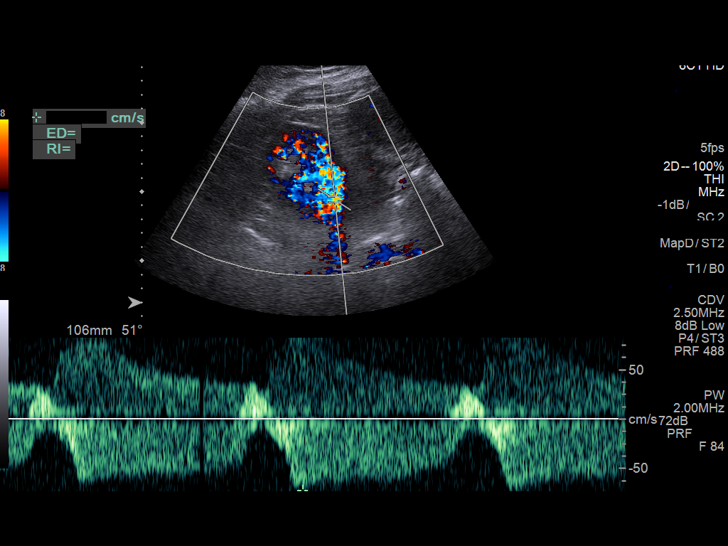
[im 15/86]
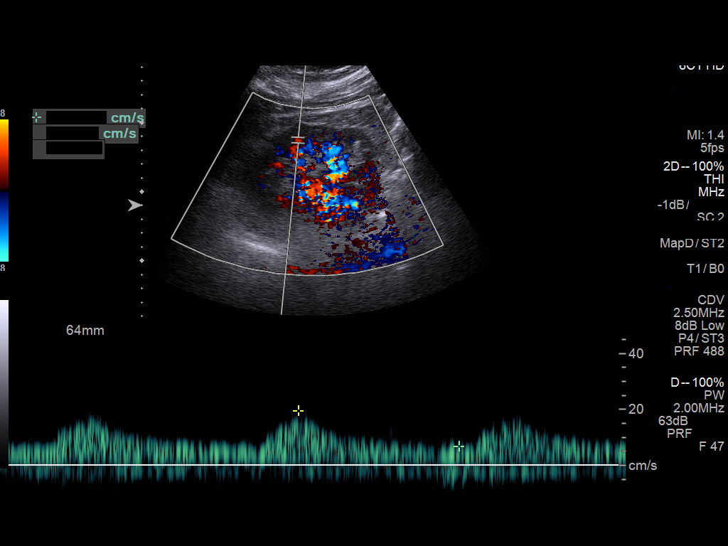
[im 22/86]
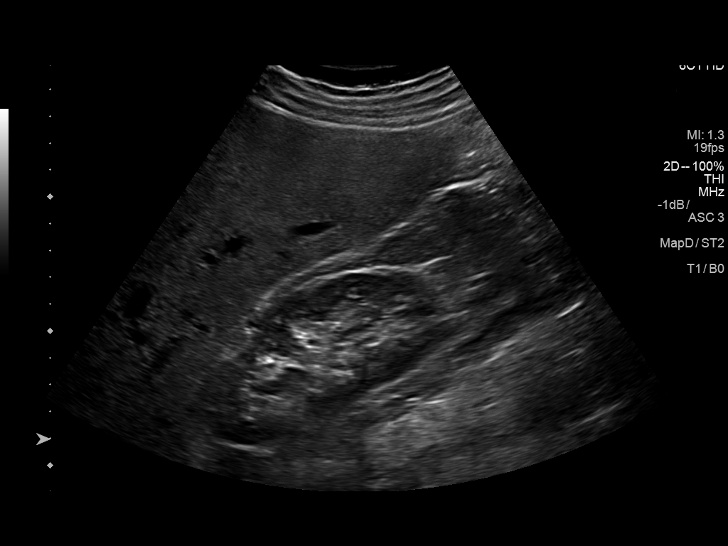
[im 29/86]
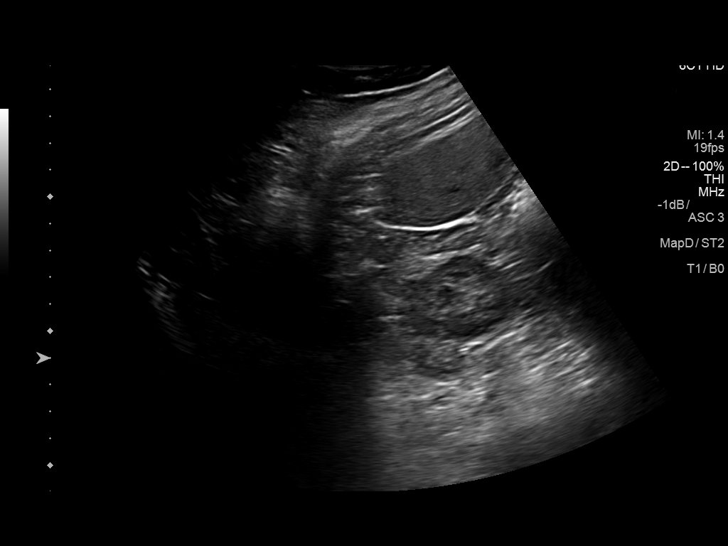
[im 36/86]
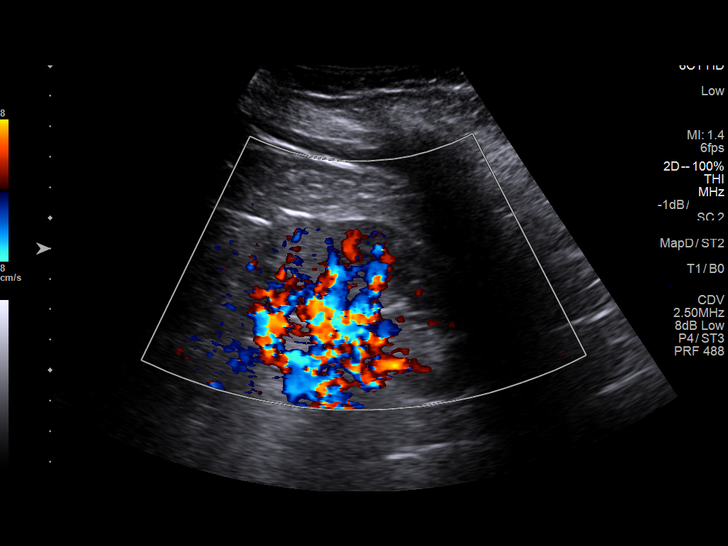
[im 43/86]
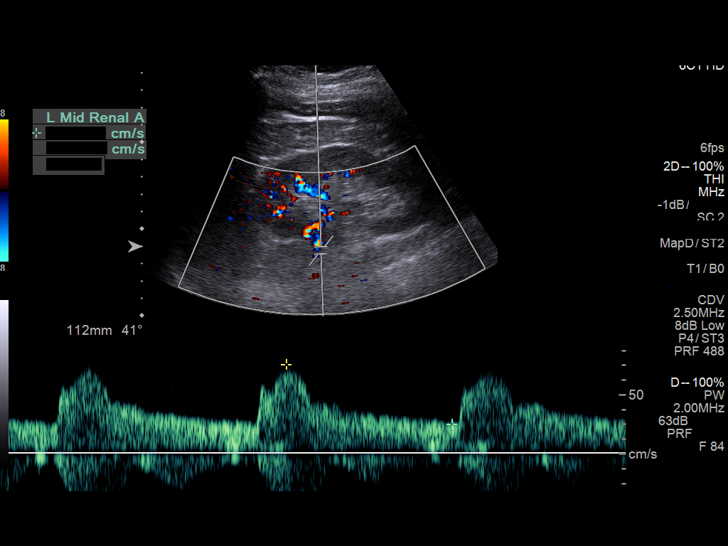
[im 50/86]
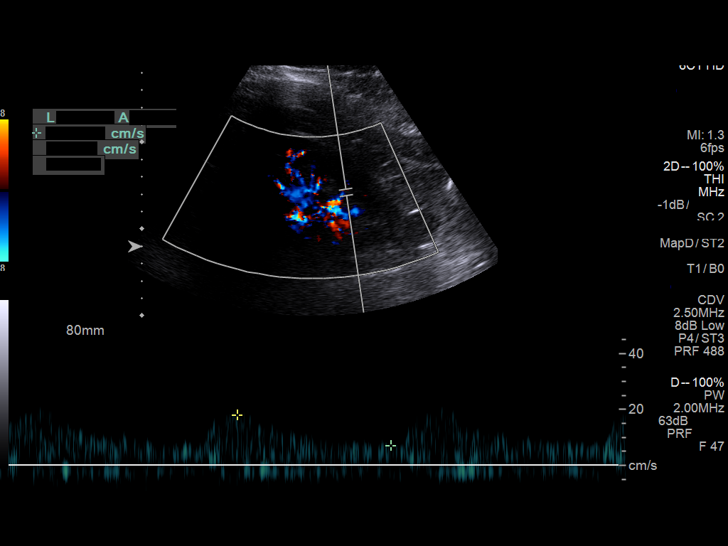
[im 57/86]
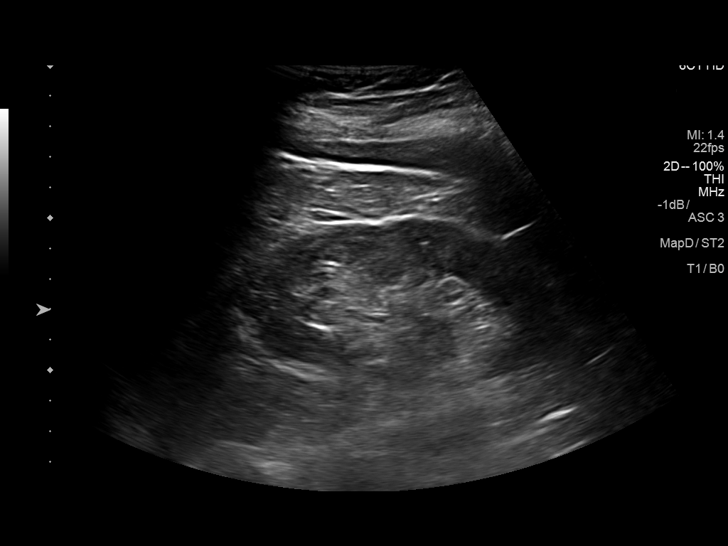
[im 64/86]
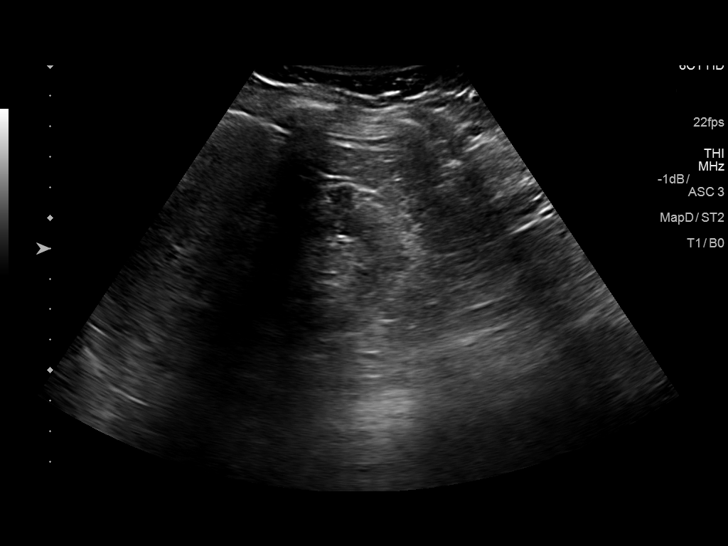
[im 71/86]
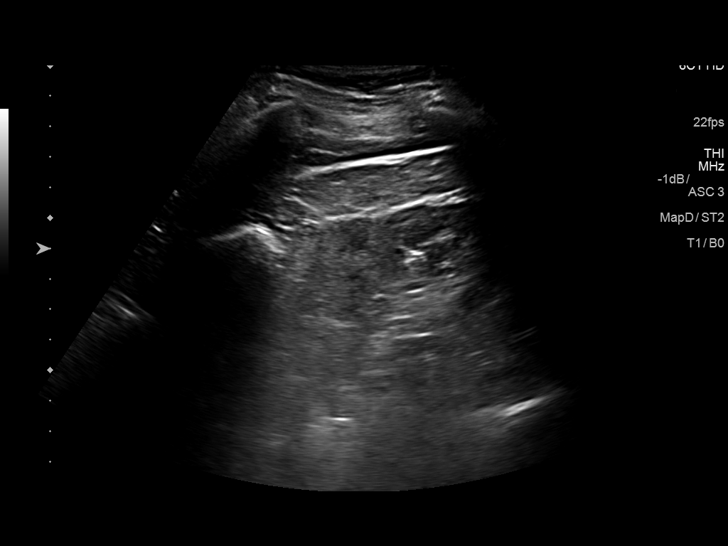
[im 78/86]
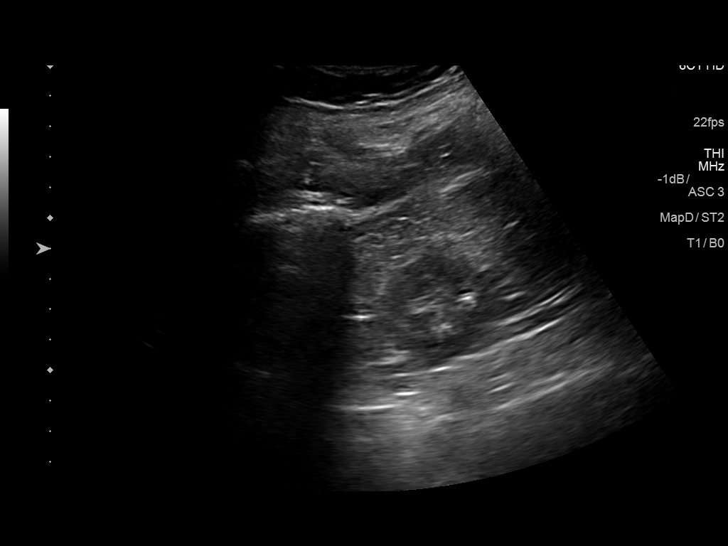
[im 86/86]
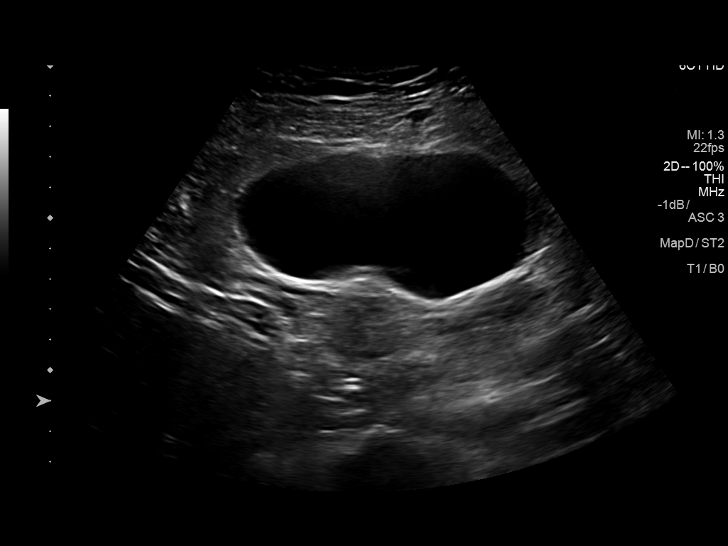

[13 of 25 positions shown; findings below may reference images not displayed]

FINDINGS: Right Kidney:

Length: 10.4 cm. Echogenicity within normal limits. No mass or
hydronephrosis visualized.

Left Kidney:

Length: 11.8 cm. Normal echogenicity. No hydronephrosis. Left
midpole small exophytic hypoechoic cyst measures 11 mm. Left lower
pole anechoic exophytic cyst measures 2.1 cm. Left kidney midpole as
rounded heterogeneous cortical prominence, compatible with a
prominent dromedary hump.

Bladder:  Within normal limits.

RENAL DUPLEX ULTRASOUND

Right Renal Artery Velocities:

Origin:  107 cm/sec

Mid:  124 cm/sec

Hilum:  116 cm/sec

Interlobar:  69 cm/sec

Arcuate:  23 Cm/sec

Left Renal Artery Velocities:

Origin:  75 cm/sec

Mid:  76 cm/sec

Hilum:  80 cm/sec

Interlobar:  56 cm/sec

Arcuate:  19 cm/sec

Aortic Velocity:  109 Cm/sec

Right Renal-Aortic Ratios:

Origin:

Mid:

Hilum:

Interlobar:

Arcuate:

Left Renal-Aortic Ratios:

Origin:

Mid:

Hilum:

Interlobar:

Arcuate:

No evidence of significant renal artery stenosis by ultrasound or
ratio criteria. Renal veins appear patent.
IMPRESSION: Negative for significant renal artery stenosis by ultrasound or
ratio criteria.

Incidental left renal cysts

No acute renal abnormality or hydronephrosis.

## 2018-02-27 ENCOUNTER — Other Ambulatory Visit: Payer: Self-pay | Admitting: Internal Medicine

## 2018-03-01 DIAGNOSIS — G4733 Obstructive sleep apnea (adult) (pediatric): Secondary | ICD-10-CM | POA: Diagnosis not present

## 2018-03-21 ENCOUNTER — Other Ambulatory Visit: Payer: Self-pay | Admitting: Internal Medicine

## 2018-03-21 DIAGNOSIS — G4733 Obstructive sleep apnea (adult) (pediatric): Secondary | ICD-10-CM

## 2018-03-21 DIAGNOSIS — E782 Mixed hyperlipidemia: Secondary | ICD-10-CM

## 2018-03-21 DIAGNOSIS — E559 Vitamin D deficiency, unspecified: Secondary | ICD-10-CM

## 2018-03-21 DIAGNOSIS — E8881 Metabolic syndrome: Secondary | ICD-10-CM

## 2018-03-21 DIAGNOSIS — R7302 Impaired glucose tolerance (oral): Secondary | ICD-10-CM

## 2018-03-21 DIAGNOSIS — Z0000 Encounter for general adult medical examination without abnormal findings: Secondary | ICD-10-CM

## 2018-03-21 DIAGNOSIS — I10 Essential (primary) hypertension: Secondary | ICD-10-CM

## 2018-03-21 DIAGNOSIS — R609 Edema, unspecified: Secondary | ICD-10-CM

## 2018-03-23 ENCOUNTER — Other Ambulatory Visit: Payer: BLUE CROSS/BLUE SHIELD | Admitting: Internal Medicine

## 2018-03-27 ENCOUNTER — Ambulatory Visit (INDEPENDENT_AMBULATORY_CARE_PROVIDER_SITE_OTHER): Payer: BLUE CROSS/BLUE SHIELD | Admitting: Internal Medicine

## 2018-03-27 ENCOUNTER — Encounter: Payer: Self-pay | Admitting: Internal Medicine

## 2018-03-27 ENCOUNTER — Other Ambulatory Visit: Payer: BLUE CROSS/BLUE SHIELD | Admitting: Internal Medicine

## 2018-03-27 VITALS — BP 92/70 | HR 80 | Ht 66.5 in | Wt 186.0 lb

## 2018-03-27 DIAGNOSIS — R7302 Impaired glucose tolerance (oral): Secondary | ICD-10-CM

## 2018-03-27 DIAGNOSIS — I10 Essential (primary) hypertension: Secondary | ICD-10-CM | POA: Diagnosis not present

## 2018-03-27 DIAGNOSIS — R609 Edema, unspecified: Secondary | ICD-10-CM | POA: Diagnosis not present

## 2018-03-27 DIAGNOSIS — E8881 Metabolic syndrome: Secondary | ICD-10-CM

## 2018-03-27 DIAGNOSIS — E782 Mixed hyperlipidemia: Secondary | ICD-10-CM

## 2018-03-27 DIAGNOSIS — Z0000 Encounter for general adult medical examination without abnormal findings: Secondary | ICD-10-CM

## 2018-03-27 DIAGNOSIS — E559 Vitamin D deficiency, unspecified: Secondary | ICD-10-CM

## 2018-03-27 DIAGNOSIS — G4733 Obstructive sleep apnea (adult) (pediatric): Secondary | ICD-10-CM

## 2018-03-27 DIAGNOSIS — Z6829 Body mass index (BMI) 29.0-29.9, adult: Secondary | ICD-10-CM

## 2018-03-27 LAB — POCT URINALYSIS DIPSTICK
APPEARANCE: NORMAL
Bilirubin, UA: NEGATIVE
GLUCOSE UA: NEGATIVE
Ketones, UA: NEGATIVE
LEUKOCYTES UA: NEGATIVE
Nitrite, UA: NEGATIVE
Odor: NORMAL
PH UA: 6 (ref 5.0–8.0)
Protein, UA: NEGATIVE
RBC UA: NEGATIVE
SPEC GRAV UA: 1.015 (ref 1.010–1.025)
Urobilinogen, UA: 0.2 E.U./dL

## 2018-03-27 NOTE — Progress Notes (Signed)
Subjective:    Patient ID: Maria Mcpherson, female    DOB: July 06, 1956, 62 y.o.   MRN: [id number]  HPI 62 year old Female for health maintenance exam and evaluation of medical issues.  She has a history of hypertension, vitamin D deficiency, hyperlipidemia, obstructive sleep apnea.  In 09-29-15 she had left hip arthroplasty by Dr. Ninfa Linden.  History of dependent edema.  History of impaired glucose tolerance.  History of basal cell carcinoma left lateral Moos removed with Mohs procedure.  History of fibrocystic breast disease.  In September 28, 2013 she had postmenopausal bleeding and saw Dr. Edwinna Areola.  She had an endometrial biopsy that showed proliferative endometrium.  She was found to have several fibroids.  These are being watched conservatively.  She had a Pap smear in Sep 28, 2013 and this was repeated today.  History of pulmonary embolus September 2006 after removal of large ovarian fibroma which apparently got twisted with part of the ovarian vein resulting in a pulmonary embolus.  She was followed on Coumadin here for several months.  She is never had a recurrence.  She had a right oophorectomy and not a complete hysterectomy.  History of tonsillectomy and adenoidectomy 09-28-1961.  Colonoscopy April 2011 with 10-year follow-up advised.  She has been vaccinated for hepatitis A, hepatitis B, MMR and typhoid due to foreign travel.  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  She is married.  Son passed away 2007/09/29.  Daughter with history of alcohol abuse and has been in rehab.  Patient does not smoke or consume alcohol.  Family history: Mother with history of breast cancer and hypertension.  Maternal grandmother with history of stroke.  Father with history of leukemia.  2 brothers in good health.  One sister in good health.  Remains overweight.  Try to walk some for exercise.    Review of Systems     Objective:   Physical Exam  Constitutional:  She appears well-developed and well-nourished. No distress.  HENT:  Head: Normocephalic and atraumatic.  Right Ear: External ear normal.  Left Ear: External ear normal.  Nose: Nose normal.  Mouth/Throat: Oropharynx is clear and moist.  Eyes: Pupils are equal, round, and reactive to light. EOM are normal. Right eye exhibits no discharge. Left eye exhibits no discharge. No scleral icterus.  Neck: Neck supple. No JVD present. No thyromegaly present.  Cardiovascular: Normal rate, regular rhythm, normal heart sounds and intact distal pulses.  No murmur heard. Pulmonary/Chest: Effort normal and breath sounds normal. No stridor. No respiratory distress. She has no wheezes. She has no rales.  Breasts normal female without masses  Abdominal: Soft. Bowel sounds are normal. She exhibits no distension and no mass. There is no tenderness. There is no rebound and no guarding. No hernia.  Genitourinary:  Genitourinary Comments: Pap taken 09-28-16.  Bimanual normal  Musculoskeletal:  1+ nonpitting edema of the lower extremities  Lymphadenopathy:    She has no cervical adenopathy.  Neurological: She is alert. She displays normal reflexes. No cranial nerve deficit or sensory deficit. She exhibits normal muscle tone. Coordination normal.  Skin: Skin is warm and dry. She is not diaphoretic. No erythema.  Psychiatric: She has a normal mood and affect. Her behavior is normal. Judgment and thought content normal.  Vitals reviewed.         Assessment & Plan:  Essential hypertension-stable on current regimen  Dependent edema-treated with diuretic  Hypokalemia on diuretic therapy-takes potassium supplement  Sleep apnea-has CPAP machine  Metabolic syndrome  BMI 64.68-[id number] Dr. Migdalia Dk clinic for weight control  Hyperlipidemia-treated with statin.  Total cholesterol 230 in 6 months ago was 222.  Triglycerides have decreased from 1 61-1 29.  LDL has increased from 1 26-1 34.  Impaired glucose  tolerance-5.9% hemoglobin A1c in 1 year ago was 5.8%-  Remote history of pulmonary embolus related to GYN surgery  Status post left hip arthroplasty February 2017  Plan: Continue metformin, metoprolol, losartan, amlodipine, Zocor, Lasix, potassium supplement.  Work on diet and exercise and weight loss.  Recommend Dr. Migdalia Dk clinic for consideration.  Follow-up in 6 months.

## 2018-03-28 LAB — COMPLETE METABOLIC PANEL WITH GFR
AG RATIO: 1.5 (calc) (ref 1.0–2.5)
ALT: 22 U/L (ref 6–29)
AST: 24 U/L (ref 10–35)
Albumin: 4.2 g/dL (ref 3.6–5.1)
Alkaline phosphatase (APISO): 69 U/L (ref 33–130)
BILIRUBIN TOTAL: 0.8 mg/dL (ref 0.2–1.2)
BUN: 22 mg/dL (ref 7–25)
CALCIUM: 9.9 mg/dL (ref 8.6–10.4)
CHLORIDE: 102 mmol/L (ref 98–110)
CO2: 32 mmol/L (ref 20–32)
Creat: 0.99 mg/dL (ref 0.50–0.99)
GFR, EST NON AFRICAN AMERICAN: 61 mL/min/{1.73_m2} (ref >=60)
GFR, Est African American: 71 mL/min/{1.73_m2} (ref >=60)
Globulin: 2.8 g/dL (calc) (ref 1.9–3.7)
Glucose, Bld: 88 mg/dL (ref 65–99)
POTASSIUM: 4.1 mmol/L (ref 3.5–5.3)
SODIUM: 142 mmol/L (ref 135–146)
Total Protein: 7 g/dL (ref 6.1–8.1)

## 2018-03-28 LAB — HEMOGLOBIN A1C
Hgb A1c MFr Bld: 5.9 % of total Hgb — ABNORMAL HIGH (ref <5.7)
Mean Plasma Glucose: 123 (calc)
eAG (mmol/L): 6.8 (calc)

## 2018-03-28 LAB — CBC WITH DIFFERENTIAL/PLATELET
BASOS PCT: 0.6 %
Basophils Absolute: 28 cells/uL (ref 0–200)
Eosinophils Absolute: 179 cells/uL (ref 15–500)
Eosinophils Relative: 3.9 %
HCT: 42 % (ref 35.0–45.0)
HEMOGLOBIN: 14.2 g/dL (ref 11.7–15.5)
Lymphs Abs: 1412 cells/uL (ref 850–3900)
MCH: 29.6 pg (ref 27.0–33.0)
MCHC: 33.8 g/dL (ref 32.0–36.0)
MCV: 87.5 fL (ref 80.0–100.0)
MONOS PCT: 9.5 %
MPV: 11.3 fL (ref 7.5–12.5)
NEUTROS ABS: 2544 {cells}/uL (ref 1500–7800)
Neutrophils Relative %: 55.3 %
PLATELETS: 232 10*3/uL (ref 140–400)
RBC: 4.8 10*6/uL (ref 3.80–5.10)
RDW: 12.6 % (ref 11.0–15.0)
TOTAL LYMPHOCYTE: 30.7 %
WBC mixed population: 437 cells/uL (ref 200–950)
WBC: 4.6 10*3/uL (ref 3.8–10.8)

## 2018-03-28 LAB — MICROALBUMIN / CREATININE URINE RATIO
CREATININE, URINE: 13 mg/dL — AB (ref 20–275)
Microalb, Ur: <0.2 mg/dL

## 2018-03-28 LAB — LIPID PANEL
CHOL/HDL RATIO: 3.2 (calc) (ref <5.0)
CHOLESTEROL: 230 mg/dL — AB (ref <200)
HDL: 72 mg/dL (ref >50)
LDL CHOLESTEROL (CALC): 134 mg/dL — AB
NON-HDL CHOLESTEROL (CALC): 158 mg/dL — AB (ref <130)
TRIGLYCERIDES: 129 mg/dL (ref <150)

## 2018-03-28 LAB — VITAMIN D 25 HYDROXY (VIT D DEFICIENCY, FRACTURES): Vit D, 25-Hydroxy: 34 ng/mL (ref 30–100)

## 2018-03-28 LAB — TSH: TSH: 0.95 mIU/L (ref 0.40–4.50)

## 2018-04-02 ENCOUNTER — Other Ambulatory Visit: Payer: Self-pay | Admitting: Internal Medicine

## 2018-04-22 NOTE — Patient Instructions (Addendum)
Please continue to work on diet exercise and weight loss and follow-up in 6 months.  Continue same medications.  Dr. Migdalia D[facility] clinic suggested to patient.

## 2018-05-19 ENCOUNTER — Other Ambulatory Visit: Payer: Self-pay | Admitting: Internal Medicine

## 2018-05-20 ENCOUNTER — Other Ambulatory Visit: Payer: Self-pay | Admitting: Internal Medicine

## 2018-06-05 DIAGNOSIS — G4733 Obstructive sleep apnea (adult) (pediatric): Secondary | ICD-10-CM | POA: Diagnosis not present

## 2018-07-26 ENCOUNTER — Encounter: Payer: Self-pay | Admitting: Pulmonary Disease

## 2018-07-26 ENCOUNTER — Ambulatory Visit (INDEPENDENT_AMBULATORY_CARE_PROVIDER_SITE_OTHER): Payer: BLUE CROSS/BLUE SHIELD | Admitting: Pulmonary Disease

## 2018-07-26 VITALS — BP 124/86 | HR 54 | Ht 66.5 in | Wt 187.0 lb

## 2018-07-26 DIAGNOSIS — Z9989 Dependence on other enabling machines and devices: Secondary | ICD-10-CM

## 2018-07-26 DIAGNOSIS — G4733 Obstructive sleep apnea (adult) (pediatric): Secondary | ICD-10-CM | POA: Diagnosis not present

## 2018-07-26 NOTE — Patient Instructions (Signed)
History of moderate obstructive sleep apnea Excellent compliance with evidence of optimal treatment  Continue using CPAP on a regular basis  Call with any significant concerns  We will see you back in about a year

## 2018-07-26 NOTE — Progress Notes (Signed)
Maria Mcpherson    [id number]    [birth year]  Primary Care Physician:Baxley, Cresenciano Lick, MD  Referring Physician: Elby Showers, MD [address] [place], [place] [address]  Chief complaint:   History of obstructive sleep apnea, compliant with CPAP  HPI:  Moderate obstructive sleep apnea diagnosed in 2017 has been on CPAP therapy on auto titrating CPAP Improvement in symptoms of daytime sleepiness noted prior to treatment  She sleeps well, functions well Good energy levels Maintains healthy sleep routine  History of hypertension-controlled Hyperlipidemia History of left hip replacement with associated occasional leg swelling  Outpatient Encounter Medications as of 07/26/2018  Medication Sig  . amLODipine (NORVASC) 5 MG tablet TAKE 1 TABLET BY MOUTH EVERY DAY  . aspirin 81 MG tablet Take 81 mg by mouth daily.  Marland Kitchen BIOTIN PO Take 2 capsules by mouth daily.  . calcium carbonate (OS-CAL) 600 MG TABS Take 600 mg by mouth 2 (two) times daily with a meal.    . fish oil-omega-3 fatty acids 1000 MG capsule Take 1 g by mouth daily.    . furosemide (LASIX) 40 MG tablet TAKE 1 TABLET BY MOUTH EVERY DAY  . loratadine (CLARITIN) 10 MG tablet Take 10 mg by mouth daily.  Marland Kitchen losartan (COZAAR) 100 MG tablet TAKE 1 TABLET BY MOUTH EVERY DAY  . metFORMIN (GLUCOPHAGE) 500 MG tablet TAKE ONE TABLET BY MOUTH EVERY DAY WITH MEAL  . metoprolol tartrate (LOPRESSOR) 50 MG tablet TAKE ONE TABLET BY MOUTH EVERY DAY  . Nutritional Supplements (JUICE PLUS FIBRE PO) Take 1 tablet by mouth daily.   . potassium chloride SA (K-DUR,KLOR-CON) 20 MEQ tablet TAKE ONE TABLET BY MOUTH TWICE DAILY  . Propylene Glycol (SYSTANE BALANCE) 0.6 % SOLN Apply 1 drop to eye 3 (three) times daily as needed (Dry eyes).  . simvastatin (ZOCOR) 40 MG tablet TAKE ONE TABLET BY MOUTH AT BEDTIME   No facility-administered encounter medications on file as of 07/26/2018.     Allergies as of 07/26/2018 - Review Complete  07/26/2018  Allergen Reaction Noted  . Ampicillin Rash 01/10/2011    Past Medical History:  Diagnosis Date  . Arthritis   . Borderline diabetes   . Fibrocystic breast disease   . Heart murmur    very slight  . History of nonmelanoma skin cancer   . HTN (hypertension)   . Hyperlipidemia   . Hypokalemia   . pulmonary emboli 10/06   same time as oophrectomy/myomectomy, evlauation was negative  . Sleep apnea     Past Surgical History:  Procedure Laterality Date  . OOPHORECTOMY  10/06   only had one ovary removed unsure of rt or left  . TONSILLECTOMY AND ADENOIDECTOMY  1963  . TOTAL HIP ARTHROPLASTY Left 08/28/2015   Procedure: LEFT TOTAL HIP ARTHROPLASTY ANTERIOR APPROACH;  Surgeon: Mcarthur Rossetti, MD;  Location: [facility];  Service: Orthopedics;  Laterality: Left;    Family History  Problem Relation Age of Onset  . Breast cancer Mother 39       mid 49s  . Hypertension Mother   . Leukemia Father   . Stroke Maternal Grandfather     Social History   Socioeconomic History  . Marital status: Married    Spouse name: Not on file  . Number of children: Not on file  . Years of education: Not on file  . Highest education level: Not on file  Occupational History  . Not on file  Social  Needs  . Financial resource strain: Not on file  . Food insecurity:    Worry: Not on file    Inability: Not on file  . Transportation needs:    Medical: Not on file    Non-medical: Not on file  Tobacco Use  . Smoking status: Never Smoker  . Smokeless tobacco: Never Used  Substance and Sexual Activity  . Alcohol use: Yes    Comment: occasional  . Drug use: No  . Sexual activity: Never  Lifestyle  . Physical activity:    Days per week: Not on file    Minutes per session: Not on file  . Stress: Not on file  Relationships  . Social connections:    Talks on phone: Not on file    Gets together: Not on file    Attends religious service: Not on file    Active member of club or  organization: Not on file    Attends meetings of clubs or organizations: Not on file    Relationship status: Not on file  . Intimate partner violence:    Fear of current or ex partner: Not on file    Emotionally abused: Not on file    Physically abused: Not on file    Forced sexual activity: Not on file  Other Topics Concern  . Not on file  Social History Narrative  . Not on file    Review of Systems  Constitutional: Negative.  Negative for diaphoresis and fatigue.  HENT: Negative.   Eyes: Negative.   Respiratory: Positive for apnea.   Cardiovascular: Positive for leg swelling.  Gastrointestinal: Negative.   Psychiatric/Behavioral: Positive for sleep disturbance.    Vitals:   07/26/18 1627  BP: 124/86  Pulse: (!) 54  SpO2: 98%     Physical Exam  Constitutional: She appears well-developed.  HENT:  Head: Normocephalic and atraumatic.  Mallampati 2  Eyes: Pupils are equal, round, and reactive to light. Right eye exhibits no discharge. Left eye exhibits no discharge.  Subconjunctival ecchymosis on left  Neck: Normal range of motion. Neck supple. No thyromegaly present.  Cardiovascular: Normal rate and regular rhythm.  No murmur heard. Pulmonary/Chest: Effort normal and breath sounds normal. No respiratory distress. She has no wheezes. She has no rales. She exhibits no tenderness.  Musculoskeletal:        General: Edema present.    Results of the Epworth flowsheet 07/26/2018  Sitting and reading 1  Watching TV 0  Sitting, inactive in a public place (e.g. a theatre or a meeting) 0  As a passenger in a car for an hour without a break 0  Lying down to rest in the afternoon when circumstances permit 0  Sitting and talking to someone 0  Sitting quietly after a lunch without alcohol 0  In a car, while stopped for a few minutes in traffic 0  Total score 1    Data Reviewed: Home sleep study in 2017 reveals AHI of 22 Compliance data reviewed revealing 100% compliance with  AHI of 0.6  Assessment:  Moderate obstructive sleep apnea Adequately treated with CPAP therapy Excellent compliance  Hypertension Well-controlled   Plan/Recommendations:  Continue treatment for obstructive sleep apnea  Encouraged to call if any significant concerns regarding sleep disordered breathing  Weight maintenance encouraged  I will see her back in the office in a year  Sherrilyn Rist MD [facility] 07/26/2018, 4:33 PM  CC: Elby Showers, MD

## 2018-07-31 DIAGNOSIS — Z803 Family history of malignant neoplasm of breast: Secondary | ICD-10-CM | POA: Diagnosis not present

## 2018-07-31 DIAGNOSIS — Z1231 Encounter for screening mammogram for malignant neoplasm of breast: Secondary | ICD-10-CM | POA: Diagnosis not present

## 2018-08-10 ENCOUNTER — Other Ambulatory Visit: Payer: Self-pay | Admitting: Internal Medicine

## 2018-09-10 DIAGNOSIS — G4733 Obstructive sleep apnea (adult) (pediatric): Secondary | ICD-10-CM | POA: Diagnosis not present

## 2018-09-24 ENCOUNTER — Other Ambulatory Visit: Payer: BLUE CROSS/BLUE SHIELD | Admitting: Internal Medicine

## 2018-09-25 ENCOUNTER — Other Ambulatory Visit: Payer: BLUE CROSS/BLUE SHIELD | Admitting: Internal Medicine

## 2018-09-25 DIAGNOSIS — R7302 Impaired glucose tolerance (oral): Secondary | ICD-10-CM | POA: Diagnosis not present

## 2018-09-25 DIAGNOSIS — E782 Mixed hyperlipidemia: Secondary | ICD-10-CM

## 2018-09-25 DIAGNOSIS — I10 Essential (primary) hypertension: Secondary | ICD-10-CM

## 2018-09-26 LAB — HEPATIC FUNCTION PANEL
AG Ratio: 1.8 (calc) (ref 1.0–2.5)
ALT: 28 U/L (ref 6–29)
AST: 24 U/L (ref 10–35)
Albumin: 4.3 g/dL (ref 3.6–5.1)
Alkaline phosphatase (APISO): 66 U/L (ref 37–153)
Bilirubin, Direct: 0.1 mg/dL (ref 0.0–0.2)
Globulin: 2.4 g/dL (calc) (ref 1.9–3.7)
Indirect Bilirubin: 0.7 mg/dL (calc) (ref 0.2–1.2)
Total Bilirubin: 0.8 mg/dL (ref 0.2–1.2)
Total Protein: 6.7 g/dL (ref 6.1–8.1)

## 2018-09-26 LAB — MICROALBUMIN / CREATININE URINE RATIO
Creatinine, Urine: 20 mg/dL (ref 20–275)
Microalb Creat Ratio: 10 mcg/mg creat (ref <30)
Microalb, Ur: 0.2 mg/dL

## 2018-09-26 LAB — BASIC METABOLIC PANEL
BUN: 22 mg/dL (ref 7–25)
CO2: 32 mmol/L (ref 20–32)
Calcium: 9.4 mg/dL (ref 8.6–10.4)
Chloride: 102 mmol/L (ref 98–110)
Creat: 0.86 mg/dL (ref 0.50–0.99)
Glucose, Bld: 102 mg/dL — ABNORMAL HIGH (ref 65–99)
Potassium: 4.1 mmol/L (ref 3.5–5.3)
Sodium: 143 mmol/L (ref 135–146)

## 2018-09-26 LAB — LIPID PANEL
Cholesterol: 205 mg/dL — ABNORMAL HIGH (ref <200)
HDL: 69 mg/dL (ref >=50)
LDL Cholesterol (Calc): 111 mg/dL (calc) — ABNORMAL HIGH
NON-HDL CHOLESTEROL (CALC): 136 mg/dL — AB (ref <130)
Total CHOL/HDL Ratio: 3 (calc) (ref <5.0)
Triglycerides: 132 mg/dL (ref <150)

## 2018-09-26 LAB — HEMOGLOBIN A1C
HEMOGLOBIN A1C: 6 %{Hb} — AB (ref <5.7)
Mean Plasma Glucose: 126 (calc)
eAG (mmol/L): 7 (calc)

## 2018-09-27 ENCOUNTER — Encounter: Payer: Self-pay | Admitting: Internal Medicine

## 2018-09-27 ENCOUNTER — Other Ambulatory Visit: Payer: Self-pay

## 2018-09-27 ENCOUNTER — Ambulatory Visit (INDEPENDENT_AMBULATORY_CARE_PROVIDER_SITE_OTHER): Payer: BLUE CROSS/BLUE SHIELD | Admitting: Internal Medicine

## 2018-09-27 VITALS — BP 130/90 | HR 80 | Temp 98.3°F | Ht 66.25 in | Wt 187.0 lb

## 2018-09-27 DIAGNOSIS — I10 Essential (primary) hypertension: Secondary | ICD-10-CM | POA: Diagnosis not present

## 2018-09-27 DIAGNOSIS — E8881 Metabolic syndrome: Secondary | ICD-10-CM | POA: Diagnosis not present

## 2018-09-27 DIAGNOSIS — R609 Edema, unspecified: Secondary | ICD-10-CM | POA: Diagnosis not present

## 2018-09-27 DIAGNOSIS — Z6829 Body mass index (BMI) 29.0-29.9, adult: Secondary | ICD-10-CM

## 2018-09-27 DIAGNOSIS — E782 Mixed hyperlipidemia: Secondary | ICD-10-CM

## 2018-09-27 DIAGNOSIS — R7302 Impaired glucose tolerance (oral): Secondary | ICD-10-CM

## 2018-09-27 NOTE — Progress Notes (Signed)
   Subjective:    Patient ID: Maria Mcpherson, female    DOB: Jul 28, 1955, 63 y.o.   MRN: [id number]  HPI 63 year old Female in today for follow-up on essential hypertension.  She is seen in the office today in person.  August 2019 for health maintenance exam and follow-up of medical issues.  She also has hyperlipidemia.  She has impaired glucose tolerance.  Her hemoglobin A1c is 6%.  Glucose intolerance is diet controlled at present time. She does not get exercise all that much.  Working a lot of hours.  Total cholesterol was 205 with triglycerides 132 and LDL cholesterol 111.  HDL cholesterol was 69.  Basic metabolic panel shows fasting glucose of 102.  Potassium is normal at 4.1.  BUN and creatinine are normal.  Liver panel is normal.  Current medications include simvastatin 40 mg daily, potassium chloride 20 mEq twice daily, furosemide 40 mg daily, metoprolol 50 mg daily, losartan 100 mg daily.  Has also been prescribed amlodipine 5 mg daily but may be out of that.    Review of Systems no new complaints     Objective:   Physical Exam Blood pressure today is 130/90.  Weight 187 pounds with increase of 1 pound since September.  BMI is 29.96.  Neck is supple.  No JVD thyromegaly or carotid bruits.  Chest clear to auscultation without rales or wheezing.  Cardiac exam regular rate and rhythm normal S1 and S2.  Trace lower extremity edema.       Assessment & Plan:  Difficult to control hypertension-would improve with weight loss  Impaired glucose tolerance-stable with diet at 6%  Hyperlipidemia-stable with Zocor 40 mg daily  Metabolic syndrome  Dependent edema treated with Lasix  BMI 29.96-continue to recommend diet exercise and weight loss.  Have discussed Dr. Migdalia [facility] clinic with her in the past.  Plan: Would like to see blood pressure a little better controlled.  Plan to reassess in 1 month.  Refill amlodipine 5 mg daily.  Continue other antihypertensives as previously prescribed.

## 2018-09-28 ENCOUNTER — Telehealth: Payer: Self-pay | Admitting: Internal Medicine

## 2018-09-28 ENCOUNTER — Encounter: Payer: Self-pay | Admitting: Internal Medicine

## 2018-09-28 MED ORDER — AMLODIPINE BESYLATE 5 MG PO TABS: 5.0000 mg | ORAL_TABLET | Freq: Every day | ORAL | 3 refills | Status: DC

## 2018-09-28 NOTE — Telephone Encounter (Signed)
Restart Norvasc 5 mg daily e scribed #[phone] with prn one year refills. Pt had discontinued this med.

## 2018-10-30 ENCOUNTER — Ambulatory Visit (INDEPENDENT_AMBULATORY_CARE_PROVIDER_SITE_OTHER): Payer: BLUE CROSS/BLUE SHIELD | Admitting: Internal Medicine

## 2018-10-30 ENCOUNTER — Other Ambulatory Visit: Payer: Self-pay | Admitting: Internal Medicine

## 2018-10-30 DIAGNOSIS — E782 Mixed hyperlipidemia: Secondary | ICD-10-CM | POA: Diagnosis not present

## 2018-10-30 DIAGNOSIS — I10 Essential (primary) hypertension: Secondary | ICD-10-CM

## 2018-10-30 DIAGNOSIS — R7302 Impaired glucose tolerance (oral): Secondary | ICD-10-CM | POA: Diagnosis not present

## 2018-10-30 DIAGNOSIS — R609 Edema, unspecified: Secondary | ICD-10-CM

## 2018-10-30 DIAGNOSIS — Z6829 Body mass index (BMI) 29.0-29.9, adult: Secondary | ICD-10-CM

## 2018-10-30 DIAGNOSIS — E8881 Metabolic syndrome: Secondary | ICD-10-CM

## 2018-10-30 NOTE — Progress Notes (Signed)
   Subjective:    Patient ID: Maria Mcpherson, female    DOB: 06-Sep-1955, 63 y.o.   MRN: [id number]  HPI  She was here for 6 month recheck early March and BP was elevated at 130/90.  She has impaired glucose tolerance, mild pure hypercholesterolemia, essential hypertension.    BMI in March was 29.96.  Suggested dietary consultation with Dr. Leafy Ro.    Last physical exam was September 2019.    History of sleep apnea and has CPAP device.  She is on Zocor 40 mg daily.  She has dependent edema and is treated with furosemide 40 mg daily.  She is on Lopressor 50 mg daily as well as losartan 100 mg daily.  Amlodipine was ordered at last visit March 6.  Patient reports good blood pressure control over the past few weeks.  She is seen today by interactive audio and video telecommunications.  This is due to the coronavirus pandemic.  She gives consent for this format of visit today.  She is identified by 2 identifiers as Maria Mcpherson, a longstanding patient in this practice.  Her physical exam was done in this office March 27, 2018  Review of Systems see above-dependent edema     Objective:   Physical Exam  Patient indicates that blood pressure is well controlled both systolically and diastolically with blood pressure readings at home.  Normally systolic is been between 793 and 903 and diastolic is been less than 90.      Assessment & Plan:  Essential hypertension-stable on current regimen of amlodipine, furosemide, metoprolol and losartan.  Impaired glucose tolerance-hemoglobin A1c in March was 6%.  Continue to work on diet exercise and weight loss.  Continue metformin.  Hyperlipidemia-LDL in March was 111 and total cholesterol 205 with normal HDL and triglycerides.  Continue statin medication  Plan: Schedule physical exam after March 28, 2019 and continue to monitor blood pressure at home.  Call if any questions or concerns.

## 2018-11-18 NOTE — Patient Instructions (Signed)
Amlodipine has been refilled and continue with other antihypertensives as previously prescribed.  Continue statin medication.  Continue to watch diet.  Try to get regular exercise.  Follow-up in 4 weeks.

## 2018-11-21 ENCOUNTER — Encounter: Payer: Self-pay | Admitting: Internal Medicine

## 2018-11-21 NOTE — Patient Instructions (Signed)
Continue current antihypertensive regimen.  Physical exam needs to be scheduled for September 2020.  This will include fasting lab studies.  Work on diet exercise and weight loss.  Continue other medications as previously prescribed.

## 2018-12-10 DIAGNOSIS — G4733 Obstructive sleep apnea (adult) (pediatric): Secondary | ICD-10-CM | POA: Diagnosis not present

## 2019-01-19 ENCOUNTER — Other Ambulatory Visit: Payer: Self-pay | Admitting: Internal Medicine

## 2019-04-09 ENCOUNTER — Other Ambulatory Visit: Payer: Self-pay | Admitting: Internal Medicine

## 2019-04-13 ENCOUNTER — Other Ambulatory Visit: Payer: Self-pay | Admitting: Internal Medicine

## 2019-04-22 DIAGNOSIS — D485 Neoplasm of uncertain behavior of skin: Secondary | ICD-10-CM | POA: Diagnosis not present

## 2019-04-22 DIAGNOSIS — D225 Melanocytic nevi of trunk: Secondary | ICD-10-CM | POA: Diagnosis not present

## 2019-04-22 DIAGNOSIS — L821 Other seborrheic keratosis: Secondary | ICD-10-CM | POA: Diagnosis not present

## 2019-04-22 DIAGNOSIS — C44311 Basal cell carcinoma of skin of nose: Secondary | ICD-10-CM | POA: Diagnosis not present

## 2019-04-22 DIAGNOSIS — Z85828 Personal history of other malignant neoplasm of skin: Secondary | ICD-10-CM | POA: Diagnosis not present

## 2019-04-22 DIAGNOSIS — L814 Other melanin hyperpigmentation: Secondary | ICD-10-CM | POA: Diagnosis not present

## 2019-04-22 DIAGNOSIS — B079 Viral wart, unspecified: Secondary | ICD-10-CM | POA: Diagnosis not present

## 2019-04-23 ENCOUNTER — Other Ambulatory Visit: Payer: Self-pay | Admitting: Internal Medicine

## 2019-04-24 ENCOUNTER — Other Ambulatory Visit: Payer: Self-pay | Admitting: Internal Medicine

## 2019-04-24 NOTE — Telephone Encounter (Signed)
Pt is past due for CPE due this month in fact and needs appt before endof year before refilling

## 2019-04-25 ENCOUNTER — Telehealth: Payer: Self-pay | Admitting: Internal Medicine

## 2019-04-25 ENCOUNTER — Other Ambulatory Visit: Payer: Self-pay

## 2019-04-25 MED ORDER — METFORMIN HCL 500 MG PO TABS: ORAL_TABLET | ORAL | 0 refills | Status: DC

## 2019-04-25 MED ORDER — METOPROLOL TARTRATE 50 MG PO TABS: 50.0000 mg | ORAL_TABLET | Freq: Every day | ORAL | 0 refills | Status: DC

## 2019-04-25 NOTE — Addendum Note (Signed)
Addended by: Mady Haagensen on: 04/25/2019 12:04 PM   Modules accepted: Orders

## 2019-04-25 NOTE — Telephone Encounter (Signed)
error 

## 2019-04-25 NOTE — Telephone Encounter (Signed)
Left detailed message, needs to call back to book CPE

## 2019-05-07 DIAGNOSIS — Z23 Encounter for immunization: Secondary | ICD-10-CM | POA: Diagnosis not present

## 2019-05-13 ENCOUNTER — Other Ambulatory Visit: Payer: Self-pay | Admitting: Internal Medicine

## 2019-05-13 ENCOUNTER — Other Ambulatory Visit: Payer: Self-pay

## 2019-05-13 DIAGNOSIS — E782 Mixed hyperlipidemia: Secondary | ICD-10-CM | POA: Diagnosis not present

## 2019-05-13 DIAGNOSIS — E8881 Metabolic syndrome: Secondary | ICD-10-CM

## 2019-05-13 DIAGNOSIS — I10 Essential (primary) hypertension: Secondary | ICD-10-CM | POA: Diagnosis not present

## 2019-05-13 DIAGNOSIS — R7302 Impaired glucose tolerance (oral): Secondary | ICD-10-CM

## 2019-05-13 DIAGNOSIS — Z0000 Encounter for general adult medical examination without abnormal findings: Secondary | ICD-10-CM

## 2019-05-14 LAB — COMPLETE METABOLIC PANEL WITH GFR
AG Ratio: 1.7 (calc) (ref 1.0–2.5)
ALT: 24 U/L (ref 6–29)
AST: 30 U/L (ref 10–35)
Albumin: 4.3 g/dL (ref 3.6–5.1)
Alkaline phosphatase (APISO): 64 U/L (ref 37–153)
BUN: 21 mg/dL (ref 7–25)
CO2: 32 mmol/L (ref 20–32)
Calcium: 9.8 mg/dL (ref 8.6–10.4)
Chloride: 102 mmol/L (ref 98–110)
Creat: 0.86 mg/dL (ref 0.50–0.99)
GFR, Est African American: 84 mL/min/{1.73_m2} (ref >=60)
GFR, Est Non African American: 72 mL/min/{1.73_m2} (ref >=60)
Globulin: 2.5 g/dL (calc) (ref 1.9–3.7)
Glucose, Bld: 98 mg/dL (ref 65–99)
Potassium: 3.7 mmol/L (ref 3.5–5.3)
Sodium: 143 mmol/L (ref 135–146)
Total Bilirubin: 0.9 mg/dL (ref 0.2–1.2)
Total Protein: 6.8 g/dL (ref 6.1–8.1)

## 2019-05-14 LAB — HEMOGLOBIN A1C
Hgb A1c MFr Bld: 5.6 % of total Hgb (ref <5.7)
Mean Plasma Glucose: 114 (calc)
eAG (mmol/L): 6.3 (calc)

## 2019-05-14 LAB — CBC WITH DIFFERENTIAL/PLATELET
Absolute Monocytes: 368 cells/uL (ref 200–950)
Basophils Absolute: 21 cells/uL (ref 0–200)
Basophils Relative: 0.6 %
Eosinophils Absolute: 179 cells/uL (ref 15–500)
Eosinophils Relative: 5.1 %
HCT: 40.4 % (ref 35.0–45.0)
Hemoglobin: 13.7 g/dL (ref 11.7–15.5)
Lymphs Abs: 1040 cells/uL (ref 850–3900)
MCH: 30.7 pg (ref 27.0–33.0)
MCHC: 33.9 g/dL (ref 32.0–36.0)
MCV: 90.6 fL (ref 80.0–100.0)
MPV: 11.3 fL (ref 7.5–12.5)
Monocytes Relative: 10.5 %
Neutro Abs: 1894 cells/uL (ref 1500–7800)
Neutrophils Relative %: 54.1 %
Platelets: 233 10*3/uL (ref 140–400)
RBC: 4.46 10*6/uL (ref 3.80–5.10)
RDW: 12.7 % (ref 11.0–15.0)
Total Lymphocyte: 29.7 %
WBC: 3.5 10*3/uL — ABNORMAL LOW (ref 3.8–10.8)

## 2019-05-14 LAB — LIPID PANEL
Cholesterol: 227 mg/dL — ABNORMAL HIGH (ref <200)
HDL: 70 mg/dL (ref >=50)
LDL Cholesterol (Calc): 131 mg/dL (calc) — ABNORMAL HIGH
Non-HDL Cholesterol (Calc): 157 mg/dL (calc) — ABNORMAL HIGH (ref <130)
Total CHOL/HDL Ratio: 3.2 (calc) (ref <5.0)
Triglycerides: 144 mg/dL (ref <150)

## 2019-05-14 LAB — VITAMIN D 25 HYDROXY (VIT D DEFICIENCY, FRACTURES): Vit D, 25-Hydroxy: 32 ng/mL (ref 30–100)

## 2019-05-14 LAB — TSH: TSH: 1.34 mIU/L (ref 0.40–4.50)

## 2019-05-15 ENCOUNTER — Other Ambulatory Visit: Payer: Self-pay

## 2019-05-15 ENCOUNTER — Ambulatory Visit (INDEPENDENT_AMBULATORY_CARE_PROVIDER_SITE_OTHER): Payer: BC Managed Care – PPO | Admitting: Internal Medicine

## 2019-05-15 ENCOUNTER — Encounter: Payer: Self-pay | Admitting: Internal Medicine

## 2019-05-15 VITALS — BP 120/80 | HR 66 | Temp 98.1°F | Ht 66.25 in | Wt 184.0 lb

## 2019-05-15 DIAGNOSIS — E8881 Metabolic syndrome: Secondary | ICD-10-CM

## 2019-05-15 DIAGNOSIS — E782 Mixed hyperlipidemia: Secondary | ICD-10-CM

## 2019-05-15 DIAGNOSIS — G4733 Obstructive sleep apnea (adult) (pediatric): Secondary | ICD-10-CM

## 2019-05-15 DIAGNOSIS — Z0000 Encounter for general adult medical examination without abnormal findings: Secondary | ICD-10-CM | POA: Diagnosis not present

## 2019-05-15 DIAGNOSIS — Z6829 Body mass index (BMI) 29.0-29.9, adult: Secondary | ICD-10-CM

## 2019-05-15 DIAGNOSIS — I10 Essential (primary) hypertension: Secondary | ICD-10-CM | POA: Diagnosis not present

## 2019-05-15 DIAGNOSIS — R609 Edema, unspecified: Secondary | ICD-10-CM

## 2019-05-15 DIAGNOSIS — R7302 Impaired glucose tolerance (oral): Secondary | ICD-10-CM

## 2019-05-15 LAB — POCT URINALYSIS DIPSTICK
Appearance: NEGATIVE
Bilirubin, UA: NEGATIVE
Blood, UA: NEGATIVE
Glucose, UA: NEGATIVE
Ketones, UA: NEGATIVE
Leukocytes, UA: NEGATIVE
Nitrite, UA: NEGATIVE
Odor: NEGATIVE
Protein, UA: NEGATIVE
Spec Grav, UA: 1.01 (ref 1.010–1.025)
Urobilinogen, UA: 0.2 E.U./dL
pH, UA: 6.5 (ref 5.0–8.0)

## 2019-05-15 NOTE — Progress Notes (Signed)
Subjective:    Patient ID: Maria Mcpherson, female    DOB: October 12, 1955, 63 y.o.   MRN: [id number]  HPI  63 year old Female for health maintenance exam and evaluation of medical issues.  History of essential hypertension, vitamin D deficiency, obstructive sleep apnea.  In 09/24/2015 she had left hip arthroplasty by Dr. Ninfa Linden.  History of dependent edema.  History of impaired glucose tolerance.  History of hyperlipidemia  History of basal cell carcinoma left nose removed with Mohs procedure.  In 2015 she had postmenopausal bleeding and saw Dr. Edwinna Areola.  She had endometrial biopsy that showed proliferative endometrium.  She was found to have several fibroids and these are being watched.  History of fibrocystic breast disease.  History of pulmonary embolus September 2006 after removal of a large ovarian fibroma which apparently got twisted with part of the ovarian vein resulting in a pulmonary embolus.  She was followed on Coumadin here for several months.  She has never had recurrence.  She only had a right oophorectomy and not a complete hysterectomy.  History of tonsillectomy and adenoidectomy 1963.  Colonoscopy April 2011 with 10-year follow-up advised  Had bone density study 2011.  Has had vaccinations for hepatitis A, hepatitis B, MMR and typhoid due to foreign travel.  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  She is married.  Son passed away in 09/24/2007.  One daughter who has had issues with alcohol abuse.  Patient does not smoke or consume alcohol.  Family history: Mother with history of breast cancer and hypertension.  Maternal grandmother with history of stroke.  Father with history of leukemia.  2 brothers in good health.  1 sister in good health.  Family history:    Review of Systems  Basal cell  carcinoma of nose recently diagnosed and is to have Moh's surgery in the near future      Objective:   Physical Exam Vitals signs reviewed.  Constitutional:      Appearance: Normal appearance. She is obese. She is not diaphoretic.  HENT:     Head: Normocephalic and atraumatic.     Right Ear: Tympanic membrane normal.     Left Ear: Tympanic membrane normal.     Nose: Nose normal.  Eyes:     General: No scleral icterus.    Extraocular Movements: Extraocular movements intact.     Conjunctiva/sclera: Conjunctivae normal.     Pupils: Pupils are equal, round, and reactive to light.  Neck:     Musculoskeletal: Neck supple. No neck rigidity.     Vascular: No carotid bruit.  Cardiovascular:     Rate and Rhythm: Normal rate and regular rhythm.     Pulses: Normal pulses.     Heart sounds: Normal heart sounds. No murmur.  Pulmonary:     Effort: No respiratory distress.     Breath sounds: Normal breath sounds. No wheezing or rales.  Abdominal:     General: Bowel sounds are normal.     Palpations: Abdomen is soft. There is no mass.     Tenderness: There is no abdominal tenderness. There is no guarding.  Genitourinary:    Comments: Pap taken 2018.  Bimanual normal. Musculoskeletal:     Comments: Trace lower extremity edema nonpitting  Lymphadenopathy:     Cervical: No cervical adenopathy.  Skin:    General: Skin is warm and dry.  Neurological:  General: No focal deficit present.     Mental Status: She is alert and oriented to person, place, and time.     Cranial Nerves: No cranial nerve deficit.  Psychiatric:        Mood and Affect: Mood normal.        Behavior: Behavior normal.        Thought Content: Thought content normal.        Judgment: Judgment normal.           Assessment & Plan:  Essential hypertension-stable on current regimen  Dependent edema treated with diuretics  Hypokalemia on diuretic therapy-takes potassium supplement and potassium is within normal limits.  Sleep apnea-on CPAP device  Metabolic syndrome  Impaired glucose tolerance   Hyperlipidemia treated with statin medication  Status post left hip arthroplasty February 2017  Remote history of pulmonary embolus related to GYN surgery  Plan: Flu vaccine given October 15.  Reminded regarding annual mammogram.  Needs bone density study as last one was in 2017.  Hemoglobin A1c is 5.6% and previously was 6% in March.  Total cholesterol 227 with an LDL cholesterol of 131.  In March total cholesterol was 205 with an LDL of 111.  Fasting glucose is normal.  CBC and C met are normal including renal functions.  Would like to see patient again in 6 months at which time she will need hemoglobin A1c, lipid panel, and liver functions.  She is on Zocor 40 mg daily.  Continue losartan, amlodipine and metoprolol for hypertension and Lasix for hypertension and dependent edema.  Have annual mammogram and bone density study. 

## 2019-05-22 ENCOUNTER — Other Ambulatory Visit: Payer: Self-pay | Admitting: Internal Medicine

## 2019-05-25 NOTE — Patient Instructions (Signed)
Continue to work on diet exercise and weight loss.  Continue current medications.  Have mammogram and bone density study.  Follow-up in 6 months.  It was a pleasure to see you today.

## 2019-07-03 DIAGNOSIS — U071 COVID-19: Secondary | ICD-10-CM | POA: Diagnosis not present

## 2019-07-03 DIAGNOSIS — Z20828 Contact with and (suspected) exposure to other viral communicable diseases: Secondary | ICD-10-CM | POA: Diagnosis not present

## 2019-07-04 ENCOUNTER — Other Ambulatory Visit: Payer: Self-pay

## 2019-07-04 MED ORDER — METOPROLOL TARTRATE 50 MG PO TABS: 50.0000 mg | ORAL_TABLET | Freq: Every day | ORAL | 1 refills | Status: DC

## 2019-07-04 MED ORDER — METFORMIN HCL 500 MG PO TABS: ORAL_TABLET | ORAL | 1 refills | Status: DC

## 2019-07-04 MED ORDER — FUROSEMIDE 40 MG PO TABS: 40.0000 mg | ORAL_TABLET | Freq: Every day | ORAL | 1 refills | Status: DC

## 2019-07-08 DIAGNOSIS — G4733 Obstructive sleep apnea (adult) (pediatric): Secondary | ICD-10-CM | POA: Diagnosis not present

## 2019-08-13 DIAGNOSIS — C44311 Basal cell carcinoma of skin of nose: Secondary | ICD-10-CM | POA: Diagnosis not present

## 2019-08-13 DIAGNOSIS — L989 Disorder of the skin and subcutaneous tissue, unspecified: Secondary | ICD-10-CM | POA: Diagnosis not present

## 2019-08-20 ENCOUNTER — Encounter: Payer: Self-pay | Admitting: *Deleted

## 2019-08-22 ENCOUNTER — Encounter: Payer: Self-pay | Admitting: Radiation Oncology

## 2019-08-22 NOTE — Progress Notes (Signed)
Histology and Location of Primary Skin Cancer:    Maria Mcpherson presented with the following signs/symptoms: She presented on 04/22/19 to Dr. Camillo Flaming for a skin check. She reported a lesion to her nose that was irritating and bleeding when picked at.   Past/Anticipated interventions by patient's surgeon/dermatologist for current problematic lesion, if any:  Mohs surgery completed on 08/13/19 by Dr. Winifred Olive   Past skin cancers, if any: Yes, basal cell.   1) Location/Histology/Intervention:   2) Location/Histology/Intervention:   3) Location/Histology/Intervention:   History of Blistering sunburns, if any: She denies, she does report a lot sun exposure.    SAFETY ISSUES:  Prior radiation? No  Pacemaker/ICD? No  Possible current pregnancy? No  Is the patient on methotrexate? No  Current Complaints / other details:

## 2019-08-22 NOTE — Progress Notes (Signed)
Radiation Oncology         [phone] ________________________________  Initial outpatient Consultation - via [facility]  Name: Maria Mcpherson MRN: [id number]  Date: 08/23/2019  DOB: 08-04-1955  CC:Elby Showers, MD  Karin Golden, MD   REFERRING PHYSICIAN: Karin Golden, MD  DIAGNOSIS:    ICD-10-CM   1. Basal cell carcinoma (BCC) of left side of nose  C44.311    Cancer Staging Basal cell carcinoma (BCC) of left side of nose Staging form: Cutaneous Carcinoma of the Head and Neck, AJCC 8th Edition - Pathologic stage from 08/23/2019: Stage III (pT3, pN0, cM0) - Signed by Eppie Gibson, MD on 08/23/2019  CHIEF COMPLAINT: Here to discuss management of skin cancer  HISTORY OF PRESENT ILLNESS::Maria Mcpherson is a 64 y.o. female who presented with "a little red spot" on her nose. It had been present for roughly 2 years, and she noticed it getting larger. Biopsy of the lesion on 04/22/2019 showed basal cell carcinoma, nodular type.  Subsequently, the patient saw Dr. Winifred Olive to discuss Mohs procedure. Performed on 08/13/2019, pathology from the Mohs procedure showed: infiltrative carcinoma with perineural invasion, maximum diameter of involved nerve is 0.1 mm.   She reports some asymmetry of her nose since surgery and swelling.  She underwent a steroid injection this week which helped somewhat.  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   PREVIOUS RADIATION THERAPY: No  PAST MEDICAL HISTORY:  has a past medical history of Arthritis, Borderline diabetes, Fibrocystic breast disease, Heart murmur, History of nonmelanoma skin cancer, HTN (hypertension), Hyperlipidemia, Hypokalemia, pulmonary emboli (10/06), Skin cancer, and Sleep apnea.    PAST SURGICAL HISTORY: Past Surgical History:  Procedure Laterality Date  . OOPHORECTOMY  10/06   only had one ovary removed unsure of rt or left  . TONSILLECTOMY AND ADENOIDECTOMY  1963  . TOTAL HIP ARTHROPLASTY Left 08/28/2015   Procedure: LEFT TOTAL HIP  ARTHROPLASTY ANTERIOR APPROACH;  Surgeon: Mcarthur Rossetti, MD;  Location: [facility];  Service: Orthopedics;  Laterality: Left;    FAMILY HISTORY: family history includes Breast cancer (age of onset: 43) in her mother; Hypertension in her mother; Leukemia in her father; Stroke in her maternal grandfather.  SOCIAL HISTORY:  reports that she has never smoked. She has never used smokeless tobacco. She reports previous alcohol use. She reports that she does not use drugs.  ALLERGIES: Ampicillin  MEDICATIONS:  Current Outpatient Medications  Medication Sig Dispense Refill  . amLODipine (NORVASC) 5 MG tablet Take 1 tablet (5 mg total) by mouth daily. 90 tablet 3  . aspirin 81 MG tablet Take 81 mg by mouth daily.    Marland Kitchen BIOTIN PO Take 2 capsules by mouth daily.    . calcium carbonate (OS-CAL) 600 MG TABS Take 600 mg by mouth 2 (two) times daily with a meal.      . fish oil-omega-3 fatty acids 1000 MG capsule Take 1 g by mouth daily.      . furosemide (LASIX) 40 MG tablet Take 1 tablet (40 mg total) by mouth daily. 90 tablet 1  . loratadine (CLARITIN) 10 MG tablet Take 10 mg by mouth daily.    Marland Kitchen losartan (COZAAR) 100 MG tablet TAKE 1 TABLET BY MOUTH EVERY DAY 90 tablet 3  . metFORMIN (GLUCOPHAGE) 500 MG tablet TAKE 1 TABLET BY MOUTH EVERY DAY WITH A MEAL. 90 tablet 1  . metoprolol tartrate (LOPRESSOR) 50 MG tablet Take 1 tablet (50 mg total) by mouth daily. 90 tablet 1  . Nutritional  Supplements (JUICE PLUS FIBRE PO) Take 1 tablet by mouth daily.     . potassium chloride SA (K-DUR) 20 MEQ tablet TAKE ONE TABLET BY MOUTH TWICE DAILY 180 tablet 3  . simvastatin (ZOCOR) 40 MG tablet TAKE ONE TABLET BY MOUTH AT BEDTIME 90 tablet 3  . Propylene Glycol (SYSTANE BALANCE) 0.6 % SOLN Apply 1 drop to eye 3 (three) times daily as needed (Dry eyes).     No current facility-administered medications for this encounter.    REVIEW OF SYSTEMS:  Notable for that above.   PHYSICAL EXAM:  vitals were not taken  for this visit.   Photos from Mohs surgery, with yellow marking where Dr. Winifred Olive is concerned about potential postoperative recurrence.     LABORATORY DATA:  Lab Results  Component Value Date   WBC 3.5 (L) 05/13/2019   HGB 13.7 05/13/2019   HCT 40.4 05/13/2019   MCV 90.6 05/13/2019   PLT 233 05/13/2019   CMP     Component Value Date/Time   NA 143 05/13/2019 0945   K 3.7 05/13/2019 0945   CL 102 05/13/2019 0945   CO2 32 05/13/2019 0945   GLUCOSE 98 05/13/2019 0945   BUN 21 05/13/2019 0945   CREATININE 0.86 05/13/2019 0945   CALCIUM 9.8 05/13/2019 0945   PROT 6.8 05/13/2019 0945   ALBUMIN 4.2 03/23/2017 1056   AST 30 05/13/2019 0945   ALT 24 05/13/2019 0945   ALKPHOS 73 03/23/2017 1056   BILITOT 0.9 05/13/2019 0945   GFRNONAA 72 05/13/2019 0945   GFRAA 84 05/13/2019 0945    RADIOGRAPHY: No results found.    IMPRESSION/PLAN: This is a lovely 64 year old woman with a history of skin cancer  Today, I talked to the patient about the findings and work-up thus far. We discussed the patient's diagnosis of basal cell carcinoma of the nose with significant perineural invasion and general treatment for this, highlighting the role of radiotherapy in the management. We discussed the available radiation techniques, and focused on the details of logistics and delivery.    We discussed the risks, benefits, and side effects of radiotherapy. Side effects may include but not necessarily be limited to: Fatigue, skin irritation, hair loss, irritation of the mucosa of the nostril, permanent hair loss, permanent skin changes, permanent cosmetic changes including the shape of the nose, cartilage injury;  no guarantees of treatment were given. A consent form was signed and placed in the patient's medical record.  We spoke about the pros and cons of standard fractionation over 6 weeks versus hypofractionation over 4 weeks.  A 4-week regimen would be significantly more convenient for her and this  is her preference.  We will proceed with a 4-week, 20 fraction plan.  I will give her some more time to heal and we anticipate simulation during the week of February 15 and starting treatment the week after.  She prefers morning appointments.  The patient was encouraged to ask questions that I answered to the best of my ability.   Gayleen Orem, RN, our Head and Neck Oncology Navigator was present for the consultation and she has his contact information if more questions that arise in the interim.  It was a pleasure meeting Maria Mcpherson and I look forward to participating in her care.   This encounter was provided by telemedicine platform MyChart video during pandemic precautions. The patient has given verbal consent for this type of encounter and has been advised to only accept a meeting of this type  in a secure network environment. The time spent during this encounter on date of service, in total, was 45 minutes. The attendants for this meeting include Eppie Gibson  and Adela Ports.  During the encounter, Eppie Gibson was located at [facility] Radiation Oncology Department.  Maria Mcpherson was located at home.  __________________________________________   Eppie Gibson, MD  This document serves as a record of services personally performed by Eppie Gibson, MD. It was created on her behalf by Wilburn Mylar, a trained medical scribe. The creation of this record is based on the scribe's personal observations and the provider's statements to them. This document has been checked and approved by the attending provider.

## 2019-08-23 ENCOUNTER — Other Ambulatory Visit: Payer: Self-pay

## 2019-08-23 ENCOUNTER — Encounter: Payer: Self-pay | Admitting: Radiation Oncology

## 2019-08-23 ENCOUNTER — Encounter: Payer: Self-pay | Admitting: *Deleted

## 2019-08-23 ENCOUNTER — Ambulatory Visit
Admission: RE | Admit: 2019-08-23 | Discharge: 2019-08-23 | Disposition: A | Discharge status: Home / Self Care | Payer: BC Managed Care – PPO | Source: Ambulatory Visit | Attending: Radiation Oncology | Admitting: Radiation Oncology

## 2019-08-23 DIAGNOSIS — Z9889 Other specified postprocedural states: Secondary | ICD-10-CM | POA: Diagnosis not present

## 2019-08-23 DIAGNOSIS — C44311 Basal cell carcinoma of skin of nose: Secondary | ICD-10-CM | POA: Insufficient documentation

## 2019-08-23 HISTORY — DX: Unspecified malignant neoplasm of skin, unspecified: C44.90

## 2019-08-23 NOTE — Progress Notes (Addendum)
Oncology Nurse Navigator Documentation  Met with Ms. Karner during [place] video consult with Dr. Isidore Moos to discuss RT for L nostril BCC s/p Mhos..  Introduced myself, explained my role as her navigator, provided my contact information.  Snowed her example of head mask, explained its purpose.  She denied claustrophobia.  Confirmed her understanding of [place] location.  She voiced understanding of:  The value of RT s/p Mhos procedure, opted for 4 wks tmt.  Plan for CT SIM the week of 2/15 with anticipated New Start around 2/22.  She denied barriers to care.  I encouraged her to call me with questions/needs moving forward.  Navigator Interventions  None needed at this time.  Gayleen Orem, RN, BSN Head & Neck Oncology Nurse [place] at [place] [phone]

## 2019-09-09 ENCOUNTER — Ambulatory Visit
Admission: RE | Admit: 2019-09-09 | Discharge: 2019-09-09 | Disposition: A | Discharge status: Home / Self Care | Payer: BC Managed Care – PPO | Source: Ambulatory Visit | Attending: Radiation Oncology | Admitting: Radiation Oncology

## 2019-09-09 ENCOUNTER — Other Ambulatory Visit: Payer: Self-pay

## 2019-09-09 ENCOUNTER — Encounter: Payer: Self-pay | Admitting: *Deleted

## 2019-09-09 ENCOUNTER — Ambulatory Visit: Payer: BC Managed Care – PPO | Admitting: Radiation Oncology

## 2019-09-09 DIAGNOSIS — C44311 Basal cell carcinoma of skin of nose: Secondary | ICD-10-CM | POA: Diagnosis not present

## 2019-09-09 DIAGNOSIS — Z510 Encounter for antineoplastic radiation therapy: Secondary | ICD-10-CM | POA: Insufficient documentation

## 2019-09-09 NOTE — Progress Notes (Signed)
Oncology Nurse Navigator Documentation  Met with Maria Mcpherson prior to her CT [facility]. Discussed today's procedures.  Reviewed likely tmt SEs, informed her of post-CT education that will be scheduled with RN Anderson Malta when she starts RT. Provided my business card, encouraged her to call me with questions/concerns moving forward.  Gayleen Orem, RN, BSN Head & Neck Oncology Nurse [place] at [place] [phone]

## 2019-09-10 DIAGNOSIS — Z510 Encounter for antineoplastic radiation therapy: Secondary | ICD-10-CM | POA: Diagnosis not present

## 2019-09-10 DIAGNOSIS — C44311 Basal cell carcinoma of skin of nose: Secondary | ICD-10-CM | POA: Diagnosis not present

## 2019-09-11 DIAGNOSIS — Z510 Encounter for antineoplastic radiation therapy: Secondary | ICD-10-CM | POA: Diagnosis not present

## 2019-09-11 DIAGNOSIS — C44311 Basal cell carcinoma of skin of nose: Secondary | ICD-10-CM | POA: Diagnosis not present

## 2019-09-11 NOTE — Progress Notes (Signed)
  Radiation Oncology         [phone] ________________________________  Name: Maria Mcpherson MRN: [id number]  Date: 09/09/2019  DOB: 1956-07-12  SIMULATION AND TREATMENT PLANNING NOTE  Outpatient  DIAGNOSIS:     ICD-10-CM   1. Basal cell carcinoma (BCC) of left side of nose  C44.311     NARRATIVE:  The patient was brought to the [place].  Identity was confirmed.  All relevant records and images related to the planned course of therapy were reviewed.  The patient freely provided informed written consent to proceed with treatment after reviewing the details related to the planned course of therapy. The consent form was witnessed and verified by the simulation staff.     Then, the patient was set-up in a stable reproducible  supine position for radiation therapy.  Customized head mask was made for immobilization. I drew around their tumor site with marker while carefully reviewing her photographs from Mohs surgery.  A  coated lead strip was placed on her upper lip and a coated lead plug was placed in her left nostril to minimize side effects.    TREATMENT PLANNING NOTE: Patient was taken to [facility]. Treatment planning then occurred. En face positioning of beam was determined. The radiation prescription was entered and confirmed.     A total of 2 medically necessary complex treatment devices were fabricated and supervised by me, in the form of head mask and customized electron cut out.   The patient will receive 50 Gy in 20 fractions to the left nose with electrons and bolus.    -----------------------------------  Eppie Gibson, MD

## 2019-09-16 ENCOUNTER — Other Ambulatory Visit: Payer: Self-pay

## 2019-09-16 ENCOUNTER — Ambulatory Visit
Admission: RE | Admit: 2019-09-16 | Discharge: 2019-09-16 | Disposition: A | Discharge status: Home / Self Care | Payer: BC Managed Care – PPO | Source: Ambulatory Visit | Attending: Radiation Oncology | Admitting: Radiation Oncology

## 2019-09-16 DIAGNOSIS — Z510 Encounter for antineoplastic radiation therapy: Secondary | ICD-10-CM | POA: Diagnosis not present

## 2019-09-16 DIAGNOSIS — C44311 Basal cell carcinoma of skin of nose: Secondary | ICD-10-CM | POA: Diagnosis not present

## 2019-09-17 ENCOUNTER — Ambulatory Visit
Admission: RE | Admit: 2019-09-17 | Discharge: 2019-09-17 | Disposition: A | Discharge status: Home / Self Care | Payer: BC Managed Care – PPO | Source: Ambulatory Visit | Attending: Radiation Oncology | Admitting: Radiation Oncology

## 2019-09-17 ENCOUNTER — Other Ambulatory Visit: Payer: Self-pay

## 2019-09-17 DIAGNOSIS — C44311 Basal cell carcinoma of skin of nose: Secondary | ICD-10-CM | POA: Diagnosis not present

## 2019-09-17 DIAGNOSIS — Z510 Encounter for antineoplastic radiation therapy: Secondary | ICD-10-CM | POA: Diagnosis not present

## 2019-09-18 ENCOUNTER — Ambulatory Visit
Admission: RE | Admit: 2019-09-18 | Discharge: 2019-09-18 | Disposition: A | Discharge status: Home / Self Care | Payer: BC Managed Care – PPO | Source: Ambulatory Visit | Attending: Radiation Oncology | Admitting: Radiation Oncology

## 2019-09-18 ENCOUNTER — Other Ambulatory Visit: Payer: Self-pay

## 2019-09-18 DIAGNOSIS — C44311 Basal cell carcinoma of skin of nose: Secondary | ICD-10-CM | POA: Diagnosis not present

## 2019-09-18 DIAGNOSIS — Z510 Encounter for antineoplastic radiation therapy: Secondary | ICD-10-CM | POA: Diagnosis not present

## 2019-09-18 MED ORDER — SONAFINE EX EMUL
1.0000 "application " | Freq: Once | CUTANEOUS | Status: AC
  Administered 2019-09-18: 1 via TOPICAL

## 2019-09-18 NOTE — Progress Notes (Signed)
Pt here for patient teaching.  Pt given Radiation and You booklet, skin care instructions and Sonafine.  Reviewed areas of pertinence such as fatigue and skin changes . Pt able to give teach back of to pat skin and use unscented/gentle soap,apply Sonafine bid and avoid applying anything to skin within 4 hours of treatment. Pt verbalizes understanding of information given and will contact nursing with any questions or concerns.     [email or web address]

## 2019-09-19 ENCOUNTER — Other Ambulatory Visit: Payer: Self-pay

## 2019-09-19 ENCOUNTER — Ambulatory Visit
Admission: RE | Admit: 2019-09-19 | Discharge: 2019-09-19 | Disposition: A | Discharge status: Home / Self Care | Payer: BC Managed Care – PPO | Source: Ambulatory Visit | Attending: Radiation Oncology | Admitting: Radiation Oncology

## 2019-09-19 DIAGNOSIS — C44311 Basal cell carcinoma of skin of nose: Secondary | ICD-10-CM | POA: Diagnosis not present

## 2019-09-19 DIAGNOSIS — Z510 Encounter for antineoplastic radiation therapy: Secondary | ICD-10-CM | POA: Diagnosis not present

## 2019-09-20 ENCOUNTER — Ambulatory Visit
Admission: RE | Admit: 2019-09-20 | Discharge: 2019-09-20 | Disposition: A | Discharge status: Home / Self Care | Payer: BC Managed Care – PPO | Source: Ambulatory Visit | Attending: Radiation Oncology | Admitting: Radiation Oncology

## 2019-09-20 ENCOUNTER — Other Ambulatory Visit: Payer: Self-pay

## 2019-09-20 DIAGNOSIS — C44311 Basal cell carcinoma of skin of nose: Secondary | ICD-10-CM | POA: Diagnosis not present

## 2019-09-20 DIAGNOSIS — Z510 Encounter for antineoplastic radiation therapy: Secondary | ICD-10-CM | POA: Diagnosis not present

## 2019-09-23 ENCOUNTER — Ambulatory Visit
Admission: RE | Admit: 2019-09-23 | Discharge: 2019-09-23 | Disposition: A | Discharge status: Home / Self Care | Payer: BC Managed Care – PPO | Source: Ambulatory Visit | Attending: Radiation Oncology | Admitting: Radiation Oncology

## 2019-09-23 ENCOUNTER — Other Ambulatory Visit: Payer: Self-pay

## 2019-09-23 DIAGNOSIS — Z510 Encounter for antineoplastic radiation therapy: Secondary | ICD-10-CM | POA: Diagnosis not present

## 2019-09-23 DIAGNOSIS — C44311 Basal cell carcinoma of skin of nose: Secondary | ICD-10-CM | POA: Diagnosis not present

## 2019-09-24 ENCOUNTER — Ambulatory Visit
Admission: RE | Admit: 2019-09-24 | Discharge: 2019-09-24 | Disposition: A | Discharge status: Home / Self Care | Payer: BC Managed Care – PPO | Source: Ambulatory Visit | Attending: Radiation Oncology | Admitting: Radiation Oncology

## 2019-09-24 ENCOUNTER — Other Ambulatory Visit: Payer: Self-pay

## 2019-09-24 DIAGNOSIS — C44311 Basal cell carcinoma of skin of nose: Secondary | ICD-10-CM | POA: Diagnosis not present

## 2019-09-24 DIAGNOSIS — Z510 Encounter for antineoplastic radiation therapy: Secondary | ICD-10-CM | POA: Diagnosis not present

## 2019-09-25 ENCOUNTER — Other Ambulatory Visit: Payer: Self-pay

## 2019-09-25 ENCOUNTER — Ambulatory Visit
Admission: RE | Admit: 2019-09-25 | Discharge: 2019-09-25 | Disposition: A | Discharge status: Home / Self Care | Payer: BC Managed Care – PPO | Source: Ambulatory Visit | Attending: Radiation Oncology | Admitting: Radiation Oncology

## 2019-09-25 DIAGNOSIS — C44311 Basal cell carcinoma of skin of nose: Secondary | ICD-10-CM | POA: Diagnosis not present

## 2019-09-25 DIAGNOSIS — Z1231 Encounter for screening mammogram for malignant neoplasm of breast: Secondary | ICD-10-CM | POA: Diagnosis not present

## 2019-09-25 DIAGNOSIS — Z510 Encounter for antineoplastic radiation therapy: Secondary | ICD-10-CM | POA: Diagnosis not present

## 2019-09-25 LAB — HM MAMMOGRAPHY

## 2019-09-26 ENCOUNTER — Other Ambulatory Visit: Payer: Self-pay

## 2019-09-26 ENCOUNTER — Encounter: Payer: Self-pay | Admitting: Internal Medicine

## 2019-09-26 ENCOUNTER — Ambulatory Visit
Admission: RE | Admit: 2019-09-26 | Discharge: 2019-09-26 | Disposition: A | Discharge status: Home / Self Care | Payer: BC Managed Care – PPO | Source: Ambulatory Visit | Attending: Radiation Oncology | Admitting: Radiation Oncology

## 2019-09-26 DIAGNOSIS — Z510 Encounter for antineoplastic radiation therapy: Secondary | ICD-10-CM | POA: Diagnosis not present

## 2019-09-26 DIAGNOSIS — C44311 Basal cell carcinoma of skin of nose: Secondary | ICD-10-CM | POA: Diagnosis not present

## 2019-09-27 ENCOUNTER — Ambulatory Visit
Admission: RE | Admit: 2019-09-27 | Discharge: 2019-09-27 | Disposition: A | Discharge status: Home / Self Care | Payer: BC Managed Care – PPO | Source: Ambulatory Visit | Attending: Radiation Oncology | Admitting: Radiation Oncology

## 2019-09-27 ENCOUNTER — Other Ambulatory Visit: Payer: Self-pay

## 2019-09-27 DIAGNOSIS — C44311 Basal cell carcinoma of skin of nose: Secondary | ICD-10-CM | POA: Diagnosis not present

## 2019-09-27 DIAGNOSIS — Z510 Encounter for antineoplastic radiation therapy: Secondary | ICD-10-CM | POA: Diagnosis not present

## 2019-09-28 ENCOUNTER — Other Ambulatory Visit: Payer: Self-pay | Admitting: Internal Medicine

## 2019-09-30 ENCOUNTER — Ambulatory Visit
Admission: RE | Admit: 2019-09-30 | Discharge: 2019-09-30 | Disposition: A | Discharge status: Home / Self Care | Payer: BC Managed Care – PPO | Source: Ambulatory Visit | Attending: Radiation Oncology | Admitting: Radiation Oncology

## 2019-09-30 ENCOUNTER — Other Ambulatory Visit: Payer: Self-pay

## 2019-09-30 DIAGNOSIS — C44311 Basal cell carcinoma of skin of nose: Secondary | ICD-10-CM | POA: Diagnosis not present

## 2019-09-30 DIAGNOSIS — Z510 Encounter for antineoplastic radiation therapy: Secondary | ICD-10-CM | POA: Diagnosis not present

## 2019-10-01 ENCOUNTER — Other Ambulatory Visit: Payer: Self-pay

## 2019-10-01 ENCOUNTER — Ambulatory Visit
Admission: RE | Admit: 2019-10-01 | Discharge: 2019-10-01 | Disposition: A | Discharge status: Home / Self Care | Payer: BC Managed Care – PPO | Source: Ambulatory Visit | Attending: Radiation Oncology | Admitting: Radiation Oncology

## 2019-10-01 DIAGNOSIS — Z510 Encounter for antineoplastic radiation therapy: Secondary | ICD-10-CM | POA: Diagnosis not present

## 2019-10-01 DIAGNOSIS — C44311 Basal cell carcinoma of skin of nose: Secondary | ICD-10-CM | POA: Diagnosis not present

## 2019-10-02 ENCOUNTER — Other Ambulatory Visit: Payer: Self-pay

## 2019-10-02 ENCOUNTER — Ambulatory Visit
Admission: RE | Admit: 2019-10-02 | Discharge: 2019-10-02 | Disposition: A | Discharge status: Home / Self Care | Payer: BC Managed Care – PPO | Source: Ambulatory Visit | Attending: Radiation Oncology | Admitting: Radiation Oncology

## 2019-10-02 DIAGNOSIS — C44311 Basal cell carcinoma of skin of nose: Secondary | ICD-10-CM | POA: Diagnosis not present

## 2019-10-02 DIAGNOSIS — Z510 Encounter for antineoplastic radiation therapy: Secondary | ICD-10-CM | POA: Diagnosis not present

## 2019-10-03 ENCOUNTER — Other Ambulatory Visit: Payer: Self-pay

## 2019-10-03 ENCOUNTER — Ambulatory Visit
Admission: RE | Admit: 2019-10-03 | Discharge: 2019-10-03 | Disposition: A | Discharge status: Home / Self Care | Payer: BC Managed Care – PPO | Source: Ambulatory Visit | Attending: Radiation Oncology | Admitting: Radiation Oncology

## 2019-10-03 DIAGNOSIS — C44311 Basal cell carcinoma of skin of nose: Secondary | ICD-10-CM | POA: Diagnosis not present

## 2019-10-03 DIAGNOSIS — Z510 Encounter for antineoplastic radiation therapy: Secondary | ICD-10-CM | POA: Diagnosis not present

## 2019-10-04 ENCOUNTER — Ambulatory Visit
Admission: RE | Admit: 2019-10-04 | Discharge: 2019-10-04 | Disposition: A | Discharge status: Home / Self Care | Payer: BC Managed Care – PPO | Source: Ambulatory Visit | Attending: Radiation Oncology | Admitting: Radiation Oncology

## 2019-10-04 ENCOUNTER — Other Ambulatory Visit: Payer: Self-pay

## 2019-10-04 DIAGNOSIS — C44311 Basal cell carcinoma of skin of nose: Secondary | ICD-10-CM | POA: Diagnosis not present

## 2019-10-04 DIAGNOSIS — Z510 Encounter for antineoplastic radiation therapy: Secondary | ICD-10-CM | POA: Diagnosis not present

## 2019-10-07 ENCOUNTER — Other Ambulatory Visit: Payer: Self-pay

## 2019-10-07 ENCOUNTER — Ambulatory Visit
Admission: RE | Admit: 2019-10-07 | Discharge: 2019-10-07 | Disposition: A | Discharge status: Home / Self Care | Payer: BC Managed Care – PPO | Source: Ambulatory Visit | Attending: Radiation Oncology | Admitting: Radiation Oncology

## 2019-10-07 DIAGNOSIS — Z510 Encounter for antineoplastic radiation therapy: Secondary | ICD-10-CM | POA: Diagnosis not present

## 2019-10-07 DIAGNOSIS — C44311 Basal cell carcinoma of skin of nose: Secondary | ICD-10-CM | POA: Diagnosis not present

## 2019-10-08 ENCOUNTER — Other Ambulatory Visit: Payer: Self-pay

## 2019-10-08 ENCOUNTER — Ambulatory Visit
Admission: RE | Admit: 2019-10-08 | Discharge: 2019-10-08 | Disposition: A | Discharge status: Home / Self Care | Payer: BC Managed Care – PPO | Source: Ambulatory Visit | Attending: Radiation Oncology | Admitting: Radiation Oncology

## 2019-10-08 DIAGNOSIS — Z510 Encounter for antineoplastic radiation therapy: Secondary | ICD-10-CM | POA: Diagnosis not present

## 2019-10-08 DIAGNOSIS — C44311 Basal cell carcinoma of skin of nose: Secondary | ICD-10-CM | POA: Diagnosis not present

## 2019-10-09 ENCOUNTER — Ambulatory Visit
Admission: RE | Admit: 2019-10-09 | Discharge: 2019-10-09 | Disposition: A | Discharge status: Home / Self Care | Payer: BC Managed Care – PPO | Source: Ambulatory Visit | Attending: Radiation Oncology | Admitting: Radiation Oncology

## 2019-10-09 ENCOUNTER — Other Ambulatory Visit: Payer: Self-pay

## 2019-10-09 DIAGNOSIS — C44311 Basal cell carcinoma of skin of nose: Secondary | ICD-10-CM | POA: Diagnosis not present

## 2019-10-09 DIAGNOSIS — Z510 Encounter for antineoplastic radiation therapy: Secondary | ICD-10-CM | POA: Diagnosis not present

## 2019-10-10 ENCOUNTER — Ambulatory Visit
Admission: RE | Admit: 2019-10-10 | Discharge: 2019-10-10 | Disposition: A | Discharge status: Home / Self Care | Payer: BC Managed Care – PPO | Source: Ambulatory Visit | Attending: Radiation Oncology | Admitting: Radiation Oncology

## 2019-10-10 ENCOUNTER — Other Ambulatory Visit: Payer: Self-pay

## 2019-10-10 DIAGNOSIS — C44311 Basal cell carcinoma of skin of nose: Secondary | ICD-10-CM | POA: Diagnosis not present

## 2019-10-10 DIAGNOSIS — Z510 Encounter for antineoplastic radiation therapy: Secondary | ICD-10-CM | POA: Diagnosis not present

## 2019-10-11 ENCOUNTER — Ambulatory Visit
Admission: RE | Admit: 2019-10-11 | Discharge: 2019-10-11 | Disposition: A | Discharge status: Home / Self Care | Payer: BC Managed Care – PPO | Source: Ambulatory Visit | Attending: Radiation Oncology | Admitting: Radiation Oncology

## 2019-10-11 ENCOUNTER — Other Ambulatory Visit: Payer: Self-pay

## 2019-10-11 ENCOUNTER — Encounter: Payer: Self-pay | Admitting: Radiation Oncology

## 2019-10-11 DIAGNOSIS — C44311 Basal cell carcinoma of skin of nose: Secondary | ICD-10-CM | POA: Diagnosis not present

## 2019-10-11 DIAGNOSIS — Z510 Encounter for antineoplastic radiation therapy: Secondary | ICD-10-CM | POA: Diagnosis not present

## 2019-11-08 DIAGNOSIS — G4733 Obstructive sleep apnea (adult) (pediatric): Secondary | ICD-10-CM | POA: Diagnosis not present

## 2019-11-11 ENCOUNTER — Other Ambulatory Visit: Payer: BC Managed Care – PPO | Admitting: Internal Medicine

## 2019-11-11 ENCOUNTER — Other Ambulatory Visit: Payer: Self-pay

## 2019-11-11 DIAGNOSIS — E782 Mixed hyperlipidemia: Secondary | ICD-10-CM | POA: Diagnosis not present

## 2019-11-11 DIAGNOSIS — R7302 Impaired glucose tolerance (oral): Secondary | ICD-10-CM

## 2019-11-12 ENCOUNTER — Encounter: Payer: Self-pay | Admitting: Internal Medicine

## 2019-11-12 ENCOUNTER — Telehealth: Payer: Self-pay

## 2019-11-12 ENCOUNTER — Ambulatory Visit (INDEPENDENT_AMBULATORY_CARE_PROVIDER_SITE_OTHER): Payer: BC Managed Care – PPO | Admitting: Internal Medicine

## 2019-11-12 VITALS — BP 110/70 | HR 72 | Temp 98.0°F | Ht 66.25 in | Wt 192.0 lb

## 2019-11-12 DIAGNOSIS — R7302 Impaired glucose tolerance (oral): Secondary | ICD-10-CM | POA: Diagnosis not present

## 2019-11-12 DIAGNOSIS — I10 Essential (primary) hypertension: Secondary | ICD-10-CM

## 2019-11-12 DIAGNOSIS — E782 Mixed hyperlipidemia: Secondary | ICD-10-CM

## 2019-11-12 DIAGNOSIS — G4733 Obstructive sleep apnea (adult) (pediatric): Secondary | ICD-10-CM

## 2019-11-12 DIAGNOSIS — E8881 Metabolic syndrome: Secondary | ICD-10-CM | POA: Diagnosis not present

## 2019-11-12 DIAGNOSIS — Z6830 Body mass index (BMI) 30.0-30.9, adult: Secondary | ICD-10-CM | POA: Diagnosis not present

## 2019-11-12 DIAGNOSIS — R609 Edema, unspecified: Secondary | ICD-10-CM

## 2019-11-12 LAB — LIPID PANEL
Cholesterol: 207 mg/dL — ABNORMAL HIGH (ref <200)
HDL: 72 mg/dL (ref >=50)
LDL Cholesterol (Calc): 116 mg/dL (calc) — ABNORMAL HIGH
Non-HDL Cholesterol (Calc): 135 mg/dL (calc) — ABNORMAL HIGH (ref <130)
Total CHOL/HDL Ratio: 2.9 (calc) (ref <5.0)
Triglycerides: 89 mg/dL (ref <150)

## 2019-11-12 LAB — HEPATIC FUNCTION PANEL
AG Ratio: 1.6 (calc) (ref 1.0–2.5)
ALT: 25 U/L (ref 6–29)
AST: 26 U/L (ref 10–35)
Albumin: 4.1 g/dL (ref 3.6–5.1)
Alkaline phosphatase (APISO): 62 U/L (ref 37–153)
Bilirubin, Direct: 0.1 mg/dL (ref 0.0–0.2)
Globulin: 2.5 g/dL (calc) (ref 1.9–3.7)
Indirect Bilirubin: 0.4 mg/dL (calc) (ref 0.2–1.2)
Total Bilirubin: 0.5 mg/dL (ref 0.2–1.2)
Total Protein: 6.6 g/dL (ref 6.1–8.1)

## 2019-11-12 LAB — HEMOGLOBIN A1C
Hgb A1c MFr Bld: 5.7 % of total Hgb — ABNORMAL HIGH (ref <5.7)
Mean Plasma Glucose: 117 (calc)
eAG (mmol/L): 6.5 (calc)

## 2019-11-12 NOTE — Telephone Encounter (Signed)
I called the patient today because she had canceled her upcoming follow-up appointment in radiation oncology.   Given the state of the  COVID-19 pandemic, concerning case numbers in our community, and guidance from [facility], I offered a phone assessment with the patient to determine if coming to the clinic was necessary. The patient accepted.  I let the patient know that I had spoken with Dr. Isidore Moos, and she wanted them to know the importance of washing their hands for at least 20 seconds at a time, especially after going out in public, and before they eat. Limit going out in public whenever possible. Do not touch your face, unless your hands are clean, such as when bathing. Get plenty of rest, eat well, and stay hydrated. Patient verbalized understanding and agreement  Symptomatically, the patient is doing relatively well. They report skin to treatment field is still slightly red, but almost "back to normal."  All questions were answered to the patient's satisfaction.  I encouraged the patient to call with any further questions. Otherwise, the plan is follow up closely with her dermatologist (she states she already has an appointment scheduled) and reach out to Dr. Isidore Moos as needed.     Patient is pleased with this plan, and we will cancel their upcoming follow-up to reduce the risk of COVID-19 transmission.

## 2019-11-12 NOTE — Progress Notes (Signed)
   Subjective:    Patient ID: Maria Mcpherson, female    DOB: [birth year], 64 y.o.   MRN: [id number]  HPI Patient had Moh's surgery for [facility] left nose in January followed by radiation therapy which was completed in March.  She is here for 78-month recheck on hypertension, hyperlipidemia and impaired glucose tolerance.  History of obstructive sleep apnea.  History of dependent edema.  She walked here from her office today. Enjoys walking.  Her hemoglobin A1c is stable at 5.7%.  Total cholesterol is 207 with an LDL of 116.  In October total cholesterol was 227 with an LDL of 131.  Liver functions are normal.   Review of Systems see above regarding Mohs surgery and radiation therapy     Objective:   Physical Exam Blood pressure is 110/70, pulse 72 temperature 98 degrees orally pulse oximetry 98% weight 192 pounds.  BMI 30.76.  In October weight was 184 pounds.  She will continue to work on diet exercise and weight loss.  Skin warm and dry.  Nodes none.  Neck is supple without JVD thyromegaly or carotid bruits.  Chest clear to auscultation.  Cardiac exam regular rate and rhythm normal S1 and S2 without murmurs or gallops.  No lower extremity pitting edema.       Assessment & Plan:  Hyperlipidemia-stable on Zocor 40 mg daily  Essential hypertension treated with Norvasc, Lasix 40 mg daily, losartan 100 mg daily and metoprolol 50 mg daily.  BMI 30.72-continue to work on diet exercise and weight loss.  Has gained 8 pounds since October 2020.  History of basal cell carcinoma of left nose status post Mohs surgery and radiation therapy.  Dependent edema-stable with diuretics  Hypokalemia-due to diuretic therapy and stable with potassium supplement.  History of sleep apnea  Impaired glucose tolerance treated with diet alone and stable  Plan: Continue current medications.  Continue to work on diet exercise and weight loss and follow-up for health maintenance exam and fasting labs in 6  months.  Had first [place] vaccine March 21.

## 2019-11-13 ENCOUNTER — Ambulatory Visit: Payer: Self-pay | Admitting: Radiation Oncology

## 2019-11-15 NOTE — Progress Notes (Signed)
  Patient Name: Maria Mcpherson MRN: [id number] DOB: Jun 25, 1956 Referring Physician: Tedra Senegal (Profile Not Attached) Date of Service: 11/06/2019 [facility], [place]                                                        End Of Treatment Note  Diagnoses: C44.311-Basal cell carcinoma of skin of nose  Cancer Staging: Cancer Staging Basal cell carcinoma (BCC) of left side of nose Staging form: Cutaneous Carcinoma of the Head and Neck, AJCC 8th Edition - Pathologic stage from 08/23/2019: Stage III (pT3, pN0, cM0) - Signed by Eppie Gibson, MD on 08/23/2019   Intent: Curative  Radiation Treatment Dates: 09/16/2019 through 10/11/2019 Site Technique Total Dose (Gy) Dose per Fx (Gy) Completed Fx Beam Energies  Nose: HN_L_Nose Complex 50/50 2.5 20/20 6E   Narrative: This is a delightful woman who received radiotherapy postoperatively for skin cancer of the nose. She tolerated radiation therapy relatively well.   Plan: She was given a follow-up appointment with radiation oncology to take place in 1 month.  -----------------------------------  Eppie Gibson, MD

## 2019-11-19 ENCOUNTER — Encounter: Payer: Self-pay | Admitting: Internal Medicine

## 2019-11-19 NOTE — Patient Instructions (Signed)
It was a pleasure to see you today.  Continue to work on diet exercise and weight loss.  Medical issues appear to be stable on current treatment.  Follow-up in 6 months for health maintenance exam and fasting labs.

## 2019-12-11 ENCOUNTER — Ambulatory Visit: Payer: Self-pay

## 2019-12-11 ENCOUNTER — Ambulatory Visit (INDEPENDENT_AMBULATORY_CARE_PROVIDER_SITE_OTHER): Payer: BC Managed Care – PPO | Admitting: Physician Assistant

## 2019-12-11 ENCOUNTER — Other Ambulatory Visit: Payer: Self-pay

## 2019-12-11 ENCOUNTER — Encounter: Payer: Self-pay | Admitting: Physician Assistant

## 2019-12-11 VITALS — Ht 65.75 in | Wt 191.8 lb

## 2019-12-11 DIAGNOSIS — M25551 Pain in right hip: Secondary | ICD-10-CM

## 2019-12-11 NOTE — Progress Notes (Signed)
Office Visit Note   Patient: Maria Mcpherson           Date of Birth: 01-15-1956           MRN: [id number] Visit Date: 12/11/2019              Requested by: Elby Showers, MD [address] [place],  [place] [id number] PCP: Elby Showers, MD   Assessment & Plan: Visit Diagnoses:  1. Pain in right hip     Plan: She would like to think about having a right hip intra-articular injection.  She will let us know if she would like to proceed with this in the future.  Otherwise follow-up with [place] as needed.  Follow-Up Instructions: Return if symptoms worsen or fail to improve.   Orders:  Orders Placed This Encounter  Procedures  . XR HIP UNILAT W OR W/O PELVIS 2-3 VIEWS RIGHT   No orders of the defined types were placed in this encounter.     Procedures: No procedures performed   Clinical Data: No additional findings.   Subjective: No chief complaint on file.   HPI Maria Mcpherson 64 year old female well-known to Dr. Ninfa Linden service.  History of left total hip arthroplasty 08/28/2015.  She has developed right groin pain that has became worse over the last month.  No known injury.  Does state the hip feels like it will give way at times.  She describes it more as a discomfort in the groin.  She sleeps with a pillow between her legs.  She has had rare tingling down the right leg to the knee.  No back pain.  Pain is worse with walking or going up steps. States that her left hip is doing well. Review of Systems Negative for fevers chills.  Please see HPI  Objective: Vital Signs: Ht 5' 5.75" (1.67 m)   Wt 191 lb 12.8 oz (87 kg)   LMP 12/04/2013   BMI 31.20 kg/m   Physical Exam General: Well-developed well-nourished female no acute distress. Psych: Alert and oriented x3. Ortho Exam Left hip good range of motion without pain.  Right hip minimal discomfort with internal rotation but overall fluid motion.  Bilateral trochanteric regions nontender. Specialty Comments:    No specialty comments available.  Imaging: XR HIP UNILAT W OR W/O PELVIS 2-3 VIEWS RIGHT  Result Date: 12/11/2019 AP pelvis lateral view of the right hip compared to prior films shows slight narrowing of the right hip joint space.  This is compatible with mild to moderate arthritic changes.  No acute fractures no evidence AVN.  Left total hip arthroplasty components well-seated.  No other bony abnormalities.    PMFS History: Patient Active Problem List   Diagnosis Date Noted  . Basal cell carcinoma (BCC) of left side of nose 08/23/2019  . OSA (obstructive sleep apnea) 01/28/2016  . Hypersomnia 10/14/2015  . HTN (hypertension) 10/14/2015  . Overweight 10/14/2015  . Dyspnea on exertion 10/14/2015  . Status post total replacement of left hip 08/28/2015  . Impaired glucose tolerance 10/21/2014  . Hypokalemia 10/21/2014  . Obesity 10/21/2014  . Metabolic syndrome [id number]  . Dependent edema 10/03/2012  . Fibrocystic breast disease 10/22/2011  . Hyperlipemia 01/10/2011  . Essential hypertension, benign 01/10/2011   Past Medical History:  Diagnosis Date  . Arthritis   . Borderline diabetes   . Fibrocystic breast disease   . Heart murmur    very slight  . History of nonmelanoma skin cancer   . HTN (  hypertension)   . Hyperlipidemia   . Hypokalemia   . pulmonary emboli 10/06   same time as oophrectomy/myomectomy, evlauation was negative  . Skin cancer   . Sleep apnea     Family History  Problem Relation Age of Onset  . Breast cancer Mother 72       mid 44s  . Hypertension Mother   . Leukemia Father   . Stroke Maternal Grandfather     Past Surgical History:  Procedure Laterality Date  . OOPHORECTOMY  10/06   only had one ovary removed unsure of rt or left  . TONSILLECTOMY AND ADENOIDECTOMY  1963  . TOTAL HIP ARTHROPLASTY Left 08/28/2015   Procedure: LEFT TOTAL HIP ARTHROPLASTY ANTERIOR APPROACH;  Surgeon: Mcarthur Rossetti, MD;  Location: [facility];  Service:  Orthopedics;  Laterality: Left;   Social History   Occupational History  . Not on file  Tobacco Use  . Smoking status: Never Smoker  . Smokeless tobacco: Never Used  Substance and Sexual Activity  . Alcohol use: Not Currently    Comment: occasional  . Drug use: No  . Sexual activity: Never

## 2019-12-31 ENCOUNTER — Telehealth (INDEPENDENT_AMBULATORY_CARE_PROVIDER_SITE_OTHER): Payer: Self-pay | Admitting: Internal Medicine

## 2019-12-31 DIAGNOSIS — R609 Edema, unspecified: Secondary | ICD-10-CM

## 2019-12-31 DIAGNOSIS — E8881 Metabolic syndrome: Secondary | ICD-10-CM

## 2019-12-31 DIAGNOSIS — R7302 Impaired glucose tolerance (oral): Secondary | ICD-10-CM

## 2019-12-31 DIAGNOSIS — Z6830 Body mass index (BMI) 30.0-30.9, adult: Secondary | ICD-10-CM

## 2019-12-31 DIAGNOSIS — G4733 Obstructive sleep apnea (adult) (pediatric): Secondary | ICD-10-CM

## 2019-12-31 DIAGNOSIS — I10 Essential (primary) hypertension: Secondary | ICD-10-CM

## 2019-12-31 DIAGNOSIS — E782 Mixed hyperlipidemia: Secondary | ICD-10-CM

## 2019-12-31 MED ORDER — AMLODIPINE BESYLATE 5 MG PO TABS: 5.0000 mg | ORAL_TABLET | Freq: Every day | ORAL | 1 refills | Status: DC

## 2019-12-31 MED ORDER — METOPROLOL TARTRATE 50 MG PO TABS: 50.0000 mg | ORAL_TABLET | Freq: Every day | ORAL | 1 refills | Status: DC

## 2019-12-31 MED ORDER — SIMVASTATIN 40 MG PO TABS: 40.0000 mg | ORAL_TABLET | Freq: Every day | ORAL | 1 refills | Status: DC

## 2019-12-31 MED ORDER — FUROSEMIDE 40 MG PO TABS: 40.0000 mg | ORAL_TABLET | Freq: Every day | ORAL | 1 refills | Status: DC

## 2019-12-31 MED ORDER — LOSARTAN POTASSIUM 100 MG PO TABS: 100.0000 mg | ORAL_TABLET | Freq: Every day | ORAL | 1 refills | Status: DC

## 2019-12-31 MED ORDER — METFORMIN HCL 500 MG PO TABS: ORAL_TABLET | ORAL | 1 refills | Status: DC

## 2019-12-31 NOTE — Telephone Encounter (Signed)
Review and approve all of patient's meds to be sent to [facility] with prn one year refills

## 2019-12-31 NOTE — Telephone Encounter (Signed)
Maria Mcpherson [phone]  [name] called to say she changed pharmacies in April and needs all prescriptions sent to below pharmacy, she needs refill on below medications.  90 day supply   NEW Pharmacy -- [facility]  furosemide (LASIX) 40 MG tablet  losartan (COZAAR) 100 MG tablet  amLODipine (NORVASC) 5 MG tablet  potassium chloride SA (K-DUR) 20 MEQ tablet  metoprolol tartrate (LOPRESSOR) 50 MG tablet  metFORMIN (GLUCOPHAGE) 500 MG tablet  simvastatin (ZOCOR) 40 MG tablet

## 2019-12-31 NOTE — Telephone Encounter (Signed)
Please ref[facility]

## 2019-12-31 NOTE — Telephone Encounter (Signed)
Sent to requested pharmacy.

## 2019-12-31 NOTE — Addendum Note (Signed)
Addended by: Mady Haagensen on: 12/31/2019 12:48 PM   Modules accepted: Orders

## 2020-01-06 DIAGNOSIS — G4733 Obstructive sleep apnea (adult) (pediatric): Secondary | ICD-10-CM | POA: Diagnosis not present

## 2020-04-17 DIAGNOSIS — G4733 Obstructive sleep apnea (adult) (pediatric): Secondary | ICD-10-CM | POA: Diagnosis not present

## 2020-05-06 DIAGNOSIS — Z23 Encounter for immunization: Secondary | ICD-10-CM | POA: Diagnosis not present

## 2020-05-13 ENCOUNTER — Other Ambulatory Visit: Payer: Self-pay | Admitting: Internal Medicine

## 2020-05-19 ENCOUNTER — Ambulatory Visit: Payer: BC Managed Care – PPO | Admitting: Podiatry

## 2020-05-26 ENCOUNTER — Other Ambulatory Visit: Payer: BC Managed Care – PPO | Admitting: Internal Medicine

## 2020-05-28 ENCOUNTER — Encounter: Payer: BC Managed Care – PPO | Admitting: Internal Medicine

## 2020-05-29 ENCOUNTER — Encounter: Payer: BC Managed Care – PPO | Admitting: Internal Medicine

## 2020-06-23 ENCOUNTER — Other Ambulatory Visit: Payer: Self-pay | Admitting: Internal Medicine

## 2020-06-23 DIAGNOSIS — G4733 Obstructive sleep apnea (adult) (pediatric): Secondary | ICD-10-CM | POA: Diagnosis not present

## 2020-07-22 DIAGNOSIS — G4733 Obstructive sleep apnea (adult) (pediatric): Secondary | ICD-10-CM | POA: Diagnosis not present

## 2020-08-03 ENCOUNTER — Other Ambulatory Visit: Payer: BC Managed Care – PPO | Admitting: Internal Medicine

## 2020-08-03 ENCOUNTER — Other Ambulatory Visit: Payer: Self-pay

## 2020-08-03 DIAGNOSIS — I10 Essential (primary) hypertension: Secondary | ICD-10-CM

## 2020-08-03 DIAGNOSIS — E782 Mixed hyperlipidemia: Secondary | ICD-10-CM

## 2020-08-03 DIAGNOSIS — E8881 Metabolic syndrome: Secondary | ICD-10-CM | POA: Diagnosis not present

## 2020-08-03 DIAGNOSIS — G4733 Obstructive sleep apnea (adult) (pediatric): Secondary | ICD-10-CM

## 2020-08-03 DIAGNOSIS — R7302 Impaired glucose tolerance (oral): Secondary | ICD-10-CM | POA: Diagnosis not present

## 2020-08-03 DIAGNOSIS — Z0000 Encounter for general adult medical examination without abnormal findings: Secondary | ICD-10-CM

## 2020-08-04 ENCOUNTER — Encounter: Payer: Self-pay | Admitting: Internal Medicine

## 2020-08-04 ENCOUNTER — Other Ambulatory Visit (HOSPITAL_COMMUNITY)
Admission: RE | Admit: 2020-08-04 | Discharge: 2020-08-04 | Disposition: A | Discharge status: Home / Self Care | Payer: BC Managed Care – PPO | Source: Ambulatory Visit | Attending: Internal Medicine | Admitting: Internal Medicine

## 2020-08-04 ENCOUNTER — Other Ambulatory Visit: Payer: Self-pay

## 2020-08-04 ENCOUNTER — Ambulatory Visit (INDEPENDENT_AMBULATORY_CARE_PROVIDER_SITE_OTHER): Payer: BC Managed Care – PPO | Admitting: Internal Medicine

## 2020-08-04 VITALS — BP 120/66 | HR 68 | Ht 66.0 in | Wt 197.0 lb

## 2020-08-04 DIAGNOSIS — G4733 Obstructive sleep apnea (adult) (pediatric): Secondary | ICD-10-CM | POA: Diagnosis not present

## 2020-08-04 DIAGNOSIS — E8881 Metabolic syndrome: Secondary | ICD-10-CM

## 2020-08-04 DIAGNOSIS — I10 Essential (primary) hypertension: Secondary | ICD-10-CM

## 2020-08-04 DIAGNOSIS — Z124 Encounter for screening for malignant neoplasm of cervix: Secondary | ICD-10-CM

## 2020-08-04 DIAGNOSIS — Z0000 Encounter for general adult medical examination without abnormal findings: Secondary | ICD-10-CM | POA: Diagnosis not present

## 2020-08-04 DIAGNOSIS — Z23 Encounter for immunization: Secondary | ICD-10-CM

## 2020-08-04 DIAGNOSIS — E782 Mixed hyperlipidemia: Secondary | ICD-10-CM

## 2020-08-04 DIAGNOSIS — R7302 Impaired glucose tolerance (oral): Secondary | ICD-10-CM | POA: Diagnosis not present

## 2020-08-04 DIAGNOSIS — D649 Anemia, unspecified: Secondary | ICD-10-CM

## 2020-08-04 DIAGNOSIS — Z96642 Presence of left artificial hip joint: Secondary | ICD-10-CM

## 2020-08-04 DIAGNOSIS — R609 Edema, unspecified: Secondary | ICD-10-CM

## 2020-08-04 DIAGNOSIS — E611 Iron deficiency: Secondary | ICD-10-CM

## 2020-08-04 DIAGNOSIS — Z6831 Body mass index (BMI) 31.0-31.9, adult: Secondary | ICD-10-CM

## 2020-08-04 LAB — LIPID PANEL
Cholesterol: 227 mg/dL — ABNORMAL HIGH (ref <200)
HDL: 65 mg/dL (ref >=50)
LDL Cholesterol (Calc): 129 mg/dL (calc) — ABNORMAL HIGH
Non-HDL Cholesterol (Calc): 162 mg/dL (calc) — ABNORMAL HIGH (ref <130)
Total CHOL/HDL Ratio: 3.5 (calc) (ref <5.0)
Triglycerides: 193 mg/dL — ABNORMAL HIGH (ref <150)

## 2020-08-04 LAB — CBC WITH DIFFERENTIAL/PLATELET
Absolute Monocytes: 449 cells/uL (ref 200–950)
Basophils Absolute: 29 cells/uL (ref 0–200)
Basophils Relative: 0.7 %
Eosinophils Absolute: 231 cells/uL (ref 15–500)
Eosinophils Relative: 5.5 %
HCT: 34.5 % — ABNORMAL LOW (ref 35.0–45.0)
Hemoglobin: 10.8 g/dL — ABNORMAL LOW (ref 11.7–15.5)
Lymphs Abs: 1281 cells/uL (ref 850–3900)
MCH: 26.2 pg — ABNORMAL LOW (ref 27.0–33.0)
MCHC: 31.3 g/dL — ABNORMAL LOW (ref 32.0–36.0)
MCV: 83.5 fL (ref 80.0–100.0)
MPV: 11.5 fL (ref 7.5–12.5)
Monocytes Relative: 10.7 %
Neutro Abs: 2209 cells/uL (ref 1500–7800)
Neutrophils Relative %: 52.6 %
Platelets: 286 10*3/uL (ref 140–400)
RBC: 4.13 10*6/uL (ref 3.80–5.10)
RDW: 14.3 % (ref 11.0–15.0)
Total Lymphocyte: 30.5 %
WBC: 4.2 10*3/uL (ref 3.8–10.8)

## 2020-08-04 LAB — IRON,TIBC AND FERRITIN PANEL
%SAT: 12 % (calc) — ABNORMAL LOW (ref 16–45)
Ferritin: 9 ng/mL — ABNORMAL LOW (ref 16–288)
Iron: 49 ug/dL (ref 45–160)
TIBC: 414 mcg/dL (calc) (ref 250–450)

## 2020-08-04 LAB — COMPLETE METABOLIC PANEL WITH GFR
AG Ratio: 1.6 (calc) (ref 1.0–2.5)
ALT: 25 U/L (ref 6–29)
AST: 25 U/L (ref 10–35)
Albumin: 4.1 g/dL (ref 3.6–5.1)
Alkaline phosphatase (APISO): 59 U/L (ref 37–153)
BUN: 17 mg/dL (ref 7–25)
CO2: 35 mmol/L — ABNORMAL HIGH (ref 20–32)
Calcium: 9.8 mg/dL (ref 8.6–10.4)
Chloride: 101 mmol/L (ref 98–110)
Creat: 0.89 mg/dL (ref 0.50–0.99)
GFR, Est African American: 80 mL/min/{1.73_m2} (ref >=60)
GFR, Est Non African American: 69 mL/min/{1.73_m2} (ref >=60)
Globulin: 2.5 g/dL (calc) (ref 1.9–3.7)
Glucose, Bld: 92 mg/dL (ref 65–99)
Potassium: 4.4 mmol/L (ref 3.5–5.3)
Sodium: 143 mmol/L (ref 135–146)
Total Bilirubin: 0.5 mg/dL (ref 0.2–1.2)
Total Protein: 6.6 g/dL (ref 6.1–8.1)

## 2020-08-04 LAB — POCT URINALYSIS DIPSTICK
Appearance: NEGATIVE
Bilirubin, UA: NEGATIVE
Blood, UA: NEGATIVE
Glucose, UA: NEGATIVE
Ketones, UA: NEGATIVE
Leukocytes, UA: NEGATIVE
Nitrite, UA: NEGATIVE
Odor: NEGATIVE
Protein, UA: NEGATIVE
Spec Grav, UA: 1.01 (ref 1.010–1.025)
Urobilinogen, UA: 0.2 E.U./dL
pH, UA: 7.5 (ref 5.0–8.0)

## 2020-08-04 LAB — TEST AUTHORIZATION

## 2020-08-04 LAB — HEMOGLOBIN A1C
Hgb A1c MFr Bld: 6.2 % of total Hgb — ABNORMAL HIGH (ref <5.7)
Mean Plasma Glucose: 131 mg/dL
eAG (mmol/L): 7.3 mmol/L

## 2020-08-04 LAB — FOLATE: Folate: >24 ng/mL

## 2020-08-04 LAB — VITAMIN B12: Vitamin B-12: 497 pg/mL (ref 200–1100)

## 2020-08-04 LAB — TSH: TSH: 1.41 mIU/L (ref 0.40–4.50)

## 2020-08-04 MED ORDER — METFORMIN HCL 1000 MG PO TABS: 1000.0000 mg | ORAL_TABLET | Freq: Every day | ORAL | 1 refills | Status: DC

## 2020-08-04 NOTE — Progress Notes (Signed)
Subjective:    Patient ID: Maria Mcpherson, female    DOB: 11-Feb-1956, 65 y.o.   MRN: [id number]  HPI 65 year old Female for health maintenance exam and evaluation of medical issues.  She has a history of essential hypertension, vitamin D deficiency, obstructive sleep apnea, hyperlipidemia, dependent edema and impaired glucose tolerance.  Had basal cell carcinoma left nose removed with Mohs procedure.  In 09-30-2015 she had left hip arthroplasty by Dr. Ninfa Linden.  In 2013-09-29 she had postmenopausal bleeding and saw Dr. Edwinna Areola.  She had an endometrial biopsy that showed proliferative endometrium.  She was found to have several fibroids and these are being watched.  History of fibrocystic breast disease.  History of tonsillectomy and adenoidectomy 09/29/61.  Colonoscopy April 2011 with 10-year follow-up advised.  Has had vaccination for hepatitis A, hepatitis B, MMR and typhoid due to foreign travel.  She had pulmonary embolus September 2006 after removal of a large ovarian fibroma which apparently got twisted with part of the ovarian vein resulting in left pulmonary embolus.  She was followed on 10-minute here for several months and has never had recurrence.  She only had a right oophorectomy and not a complete hysterectomy.  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  She is married.  Son passed away 30-Sep-2007.  One daughter who has had some issues with alcohol abuse.  Patient does not smoke or consume alcohol.  Family history: Mother with history of breast cancer and hypertension.  Maternal grandmother with history of stroke.  Father with history of leukemia.  2 brothers in good health.  1 sister in good health.  Review of labs shows a ferritin of 9.  Iron is low normal at 49.  Percent saturation is 12%  Apparently gives blood fairly frequently.  Needs to cut back on that.  Likely has iron deficiency due to blood donation.  This will  be followed up in a few weeks after trial of iron therapy.  Needs to have colonoscopy very soon with this new finding.  Review of Systems-no history of weakness or fatigue     Objective:   Physical Exam Blood pressure 120/66 pulse 68 regular pulse oximetry 98% weight 197 pounds BMI 31.80.  Skin: Warm and dry.  No cervical adenopathy.  No thyromegaly.  No carotid bruits.  Chest is clear to auscultation.  Breast without masses.  Cardiac exam: Regular rate and rhythm.  Abdomen soft nondistended without hepatosplenomegaly masses or tenderness.  No lower extremity pitting edema.  Bimanual exam: Normal.  Pap taken.  Neuro intact without focal deficits.  Affect felt judgment are normal.       Assessment & Plan:  New onset iron deficiency-likely due to blood donation.  Will place patient on iron supplement and follow-up in about 4 weeks.  She needs to have repeat colonoscopy.  Cut back on blood donation frequency.  Essential hypertension no stable on current regimen  Dependent edema treated with diuretic  Hypokalemia on diuretic therapy-takes potassium supplement  Sleep apnea-has CPAP device-history of hypersomnia seen in pulmonary   Metabolic syndrome  Impaired glucose tolerance-hemoglobin A1c was 5.7% in April 2021.  Has been as high as 6%.  Has been treated with diet only.  BMI 31.80-referred to [facility]  History of left hip arthroplasty in Sep 30, 2015.  Remote history of pulmonary embolus related to GYN surgery.  Hyperlipidemia treated with statin medication.  Plan: She  will follow-up here  Mid February after trial of iron therapy.  Needs colonoscopy.  Referred to weight loss clinic.  Tetanus update given.

## 2020-08-04 NOTE — Patient Instructions (Addendum)
Tetanus immunization update given. RTC in 4 weeks to follow up on low Hgb. Do not give blood in the meantime. RTC in 6 months. Take Metformin 1000 mg daily. May want to consider Dr. Migdalia Dk weight loss clinic. Continue statin medication and watch diet.

## 2020-08-06 LAB — CYTOLOGY - PAP
Comment: NEGATIVE
Diagnosis: NEGATIVE
High risk HPV: NEGATIVE

## 2020-08-19 ENCOUNTER — Encounter (INDEPENDENT_AMBULATORY_CARE_PROVIDER_SITE_OTHER): Payer: Self-pay | Admitting: Family Medicine

## 2020-08-19 ENCOUNTER — Other Ambulatory Visit: Payer: Self-pay

## 2020-08-19 ENCOUNTER — Ambulatory Visit (INDEPENDENT_AMBULATORY_CARE_PROVIDER_SITE_OTHER): Payer: BC Managed Care – PPO | Admitting: Family Medicine

## 2020-08-19 VITALS — BP 121/77 | HR 63 | Temp 97.9°F | Ht 66.0 in | Wt 191.0 lb

## 2020-08-19 DIAGNOSIS — I10 Essential (primary) hypertension: Secondary | ICD-10-CM

## 2020-08-19 DIAGNOSIS — Z9189 Other specified personal risk factors, not elsewhere classified: Secondary | ICD-10-CM | POA: Diagnosis not present

## 2020-08-19 DIAGNOSIS — R5383 Other fatigue: Secondary | ICD-10-CM | POA: Diagnosis not present

## 2020-08-19 DIAGNOSIS — E669 Obesity, unspecified: Secondary | ICD-10-CM

## 2020-08-19 DIAGNOSIS — E7849 Other hyperlipidemia: Secondary | ICD-10-CM

## 2020-08-19 DIAGNOSIS — Z1331 Encounter for screening for depression: Secondary | ICD-10-CM

## 2020-08-19 DIAGNOSIS — E559 Vitamin D deficiency, unspecified: Secondary | ICD-10-CM

## 2020-08-19 DIAGNOSIS — G4733 Obstructive sleep apnea (adult) (pediatric): Secondary | ICD-10-CM

## 2020-08-19 DIAGNOSIS — R7303 Prediabetes: Secondary | ICD-10-CM | POA: Diagnosis not present

## 2020-08-19 DIAGNOSIS — R0602 Shortness of breath: Secondary | ICD-10-CM | POA: Diagnosis not present

## 2020-08-19 DIAGNOSIS — Z0289 Encounter for other administrative examinations: Secondary | ICD-10-CM

## 2020-08-19 DIAGNOSIS — Z6830 Body mass index (BMI) 30.0-30.9, adult: Secondary | ICD-10-CM

## 2020-08-20 LAB — VITAMIN D 25 HYDROXY (VIT D DEFICIENCY, FRACTURES): Vit D, 25-Hydroxy: 40.2 ng/mL (ref 30.0–100.0)

## 2020-08-20 LAB — INSULIN, RANDOM: INSULIN: 17.1 u[IU]/mL (ref 2.6–24.9)

## 2020-08-20 LAB — T4: T4, Total: 7.8 ug/dL (ref 4.5–12.0)

## 2020-08-20 LAB — T3: T3, Total: 124 ng/dL (ref 71–180)

## 2020-08-24 NOTE — Progress Notes (Signed)
Dear Elby Showers  MD,   Thank you for referring Maria Mcpherson to our clinic. The following note includes my evaluation and treatment recommendations.  Chief Complaint:   OBESITY Maria Mcpherson (MR# [id number]) is a 65 y.o. female who presents for evaluation and treatment of obesity and related comorbidities. Current BMI is Body mass index is 31.8 kg/m. Maria Mcpherson has been struggling with her weight for many years and has been unsuccessful in either losing weight, maintaining weight loss, or reaching her healthy weight goal.  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 Maria Mcpherson will have yogurt,and fruit (1 cup yogurt), berries, and 2 tbsp granola (satisfied) at 7:30 am. Hungry around 10 or11 am and she will have a couple pieces of candy. For lunch at 12:30 leftovers from dinner; rice(1/2 cup), greens beans (1/2 cup),1/2 chicken breast with gravy and 1-2 cookies (feel full). Dinner at 7:00 pm; spaghetti (2 cups) and garlic bread (full). After dinner Maria Mcpherson will have ice cream.  [name] is currently in the action stage of change and ready to dedicate time achieving and maintaining a healthier weight. Maria Mcpherson is interested in becoming our patient and working on intensive lifestyle modifications including (but not limited to) diet and exercise for weight loss.  Maria Mcpherson's habits were reviewed today and are as follows: Her family eats meals together, she thinks her family will eat healthier with her, she struggles with family and or coworkers weight loss sabotage, her desired weight loss is 57 pounds, she has been heavy most of her life, she started gaining weight gradually over the last 10 years, her heaviest weight ever was 197 pounds, she has significant food cravings issues, she snacks frequently in the evenings, she is frequently drinking liquids with calories, she frequently makes poor food choices, she frequently eats larger portions than normal and she struggles with emotional  eating.  Depression Screen Maria Mcpherson's Food and Mood (modified PHQ-9) score was 8.  Depression screen [facility] 2/9 08/19/2020  Decreased Interest 2  Down, Depressed, Hopeless 1  PHQ - 2 Score 3  Altered sleeping 0  Tired, decreased energy 3  Change in appetite 1  Feeling bad or failure about yourself  1  Trouble concentrating 0  Moving slowly or fidgety/restless 0  Suicidal thoughts 0  PHQ-9 Score 8  Difficult doing work/chores Not difficult at all   Subjective:   1. Other fatigue  Maria Mcpherson denies daytime somnolence and denies waking up still tired. Patent has a history of symptoms of Maria Mcpherson scale. Maria Mcpherson generally gets 8 or 9 hours of sleep per night, and states that she has difficulty falling asleep. Maria Mcpherson is not present. Apneic episodes is not present. Maria Mcpherson Score is 3.  EKG-NSR at 62 bpm.  2. SOB (shortness of breath) on exertion Maria Mcpherson notes increasing shortness of breath with exercising and seems to be worsening over time with weight gain. She notes getting out of breath sooner with activity than she used to. This has not gotten worse recently. Maria Mcpherson denies shortness of breath at rest or orthopnea.   3. Prediabetes  Maria Mcpherson is on metformin 1000 daily. Maria Mcpherson is experiencing GI side effects at recent increase to 1000 mg. She was diagnosed 10 years ago.   4. Essential hypertension Maria Mcpherson is on amlodipine, Losartan, Metoprolol, and Furosemide. Maria Mcpherson was diagnosed years ago.  5. Other hyperlipidemia Maria Mcpherson is on statin. Maria Mcpherson was diagnosed years ago. She recently had a fasting lipid panel done. LDL of 129, trigly 193, and HDL  65.  6. OSA (obstructive sleep apnea) Maria Mcpherson wears a CPAP 100% of time. She sees Financial controller Maria Mcpherson for sleep medication.  7. Vitamin D deficiency  Maria Mcpherson is not on Vitamin D supplement, but is on MVI. Maria Mcpherson does note fatigue.  8. At risk for diabetes mellitus Maria Mcpherson is at higher than average risk for developing  diabetes due to obesity.    Assessment/Plan:   1. Other fatigue Maria Mcpherson does feel that her weight is causing her energy to be lower than it should be. Fatigue may be related to obesity, depression or many other causes. Labs will be ordered, and in the meanwhile, Maria Mcpherson will focus on self care including making healthy food choices, increasing physical activity and focusing on stress reduction. - EKG 12-Lead - T3 - T4  2. SOB (shortness of breath) on exertion Maria Mcpherson does feel that she gets out of breath more easily that she used to when she exercises. Maria Mcpherson's shortness of breath appears to be obesity related and exercise induced. She has agreed to work on weight loss and gradually increase exercise to treat her exercise induced shortness of breath. Will continue to monitor closely.  3. Prediabetes Maria Mcpherson will continue to work on weight loss, exercise, and decreasing simple carbohydrates to help decrease the risk of diabetes. Check insulin level today. - Insulin, random  4. Essential hypertension [name] is working on healthy weight loss and exercise to improve blood pressure control. We will watch for signs of hypotension as she continues her lifestyle modifications. Will do EKG today.  5. Other hyperlipidemia Cardiovascular risk and specific lipid/LDL goals reviewed.  We discussed several lifestyle modifications today and Maria Mcpherson will continue to work on diet, exercise and weight loss efforts. Orders and follow up as documented in patient record. We will repeat labs in 3 months.  Counseling Intensive lifestyle modifications are the first line treatment for this issue. . Dietary changes: Increase soluble fiber. Decrease simple carbohydrates. . Exercise changes: Moderate to vigorous-intensity aerobic activity 150 minutes per week if tolerated. . Lipid-lowering medications: see documented in medical record.  6. OSA (obstructive sleep apnea) Intensive lifestyle modifications are the  first line treatment for this issue. We discussed several lifestyle modifications today and she will continue to work on diet, exercise and weight loss efforts. We will continue to monitor. Orders and follow up as documented in patient record. Sharnay will follow up with [place] Maria Mcpherson as needed.  7. Vitamin D deficiency Low Vitamin D level contributes to fatigue and are associated with obesity, breast, and colon cancer. She agrees to continue to take prescription Vitamin D @50 ,000 IU every week and will follow-up for routine testing of Vitamin D, at least 2-3 times per year to avoid over-replacement. Will check Vitamin D level today. - VITAMIN D 25 Hydroxy (Vit-D Deficiency, Fractures)  8. Depression screening Behavior modification techniques were discussed today to help Haley deal with her emotional/non-hunger eating behaviors.  Orders and follow up as documented in patient record.   9. At risk for diabetes mellitus Vernessa was given approximately 15 minutes of diabetes education and counseling today. We discussed intensive lifestyle modifications today with an emphasis on weight loss as well as increasing exercise and decreasing simple carbohydrates in her diet. We also reviewed medication options with an emphasis on risk versus benefit of those discussed.   Repetitive spaced learning was employed today to elicit superior memory formation and behavioral change.  10. Class 1 obesity with serious comorbidity and body mass index (BMI) of 30.0 to 30.9  in adult, unspecified obesity type  Meiling is currently in the action stage of change and her goal is to continue with weight loss efforts. I recommend Thecla begin the structured treatment plan as follows:  She has agreed to the Category 1 Plan.  Exercise goals: No exercise has been prescribed at this time.   Behavioral modification strategies: increasing lean protein intake, meal planning and cooking strategies, keeping healthy foods in  the home and planning for success.  She was informed of the importance of frequent follow-up visits to maximize her success with intensive lifestyle modifications for her multiple health conditions. She was informed we would discuss her lab results at her next visit unless there is a critical issue that needs to be addressed sooner. Lexas agreed to keep her next visit at the agreed upon time to discuss these results.  Objective:   Blood pressure 121/77, pulse 63, temperature 97.9 F (36.6 C), temperature source Oral, height 5\' 6"  (1.676 m), last menstrual period 12/04/2013, SpO2 98 %. Body mass index is 31.8 kg/m.  EKG: Normal sinus rhythm, rate 62 bpm.  Indirect Calorimeter completed today shows a VO2 of 161 and a REE of 1126.  Her calculated basal metabolic rate is [id number] thus her basal metabolic rate is worse than expected.  II+ edema distal to knee on left side  General: Cooperative, alert, well developed, in no acute distress. HEENT: Conjunctivae and lids unremarkable. Cardiovascular: Regular rhythm.  Lungs: Normal work of breathing. Neurologic: No focal deficits.   Lab Results  Component Value Date   CREATININE 0.89 08/03/2020   BUN 17 08/03/2020   NA 143 08/03/2020   K 4.4 08/03/2020   CL 101 08/03/2020   CO2 35 (H) 08/03/2020   Lab Results  Component Value Date   ALT 25 08/03/2020   AST 25 08/03/2020   ALKPHOS 73 03/23/2017   BILITOT 0.5 08/03/2020   Lab Results  Component Value Date   HGBA1C 6.2 (H) 08/03/2020   HGBA1C 5.7 (H) 11/11/2019   HGBA1C 5.6 05/13/2019   HGBA1C 6.0 (H) 09/25/2018   HGBA1C 5.9 (H) 03/27/2018   Lab Results  Component Value Date   INSULIN 17.1 08/19/2020   Lab Results  Component Value Date   TSH 1.41 08/03/2020   Lab Results  Component Value Date   CHOL 227 (H) 08/03/2020   HDL 65 08/03/2020   LDLCALC 129 (H) 08/03/2020   TRIG 193 (H) 08/03/2020   CHOLHDL 3.5 08/03/2020   Lab Results  Component Value Date   WBC 4.2  08/03/2020   HGB 10.8 (L) 08/03/2020   HCT 34.5 (L) 08/03/2020   MCV 83.5 08/03/2020   PLT 286 08/03/2020   Lab Results  Component Value Date   IRON 49 08/03/2020   TIBC 414 08/03/2020   FERRITIN 9 (L) 08/03/2020     Attestation Statements:   Reviewed by clinician on day of visit: allergies, medications, problem list, medical history, surgical history, family history, social history, and previous encounter notes.  This is the patient's first visit at [facility]. The patient's NEW PATIENT PACKET was reviewed at length. Included in the packet: current and past health history, medications, allergies, ROS, gynecologic history (women only), surgical history, family history, social history, weight history, weight loss surgery history (for those that have had weight loss surgery), nutritional evaluation, mood and food questionnaire, PHQ9, Maria questionnaire, sleep habits questionnaire, patient life and health improvement goals questionnaire. These will all be scanned into the patient's chart under  media.   During the visit, I independently reviewed the patient's EKG, bioimpedance scale results, and indirect calorimeter results. I used this information to tailor a meal plan for the patient that will help her to lose weight and will improve her obesity-related conditions going forward. I performed a medically necessary appropriate examination and/or evaluation. I discussed the assessment and treatment plan with the patient. The patient was provided an opportunity to ask questions and all were answered. The patient agreed with the plan and demonstrated an understanding of the instructions. Labs were ordered at this visit and will be reviewed at the next visit unless more critical results need to be addressed immediately. Clinical information was updated and documented in the EMR.   Time spent on visit including pre-visit chart review and post-visit care was 45 minutes.   A separate  15 minutes was spent on risk counseling (see above).    I, Para March, am acting as transcriptionist for Coralie Common, MD.  I have reviewed the above documentation for accuracy and completeness, and I agree with the above. - Jinny Blossom, MD

## 2020-09-02 ENCOUNTER — Encounter (INDEPENDENT_AMBULATORY_CARE_PROVIDER_SITE_OTHER): Payer: Self-pay | Admitting: Family Medicine

## 2020-09-02 ENCOUNTER — Other Ambulatory Visit: Payer: Self-pay

## 2020-09-02 ENCOUNTER — Ambulatory Visit (INDEPENDENT_AMBULATORY_CARE_PROVIDER_SITE_OTHER): Payer: BC Managed Care – PPO | Admitting: Bariatrics

## 2020-09-02 VITALS — BP 122/75 | HR 67 | Temp 98.0°F | Ht 66.0 in | Wt 188.0 lb

## 2020-09-02 DIAGNOSIS — Z9189 Other specified personal risk factors, not elsewhere classified: Secondary | ICD-10-CM

## 2020-09-02 DIAGNOSIS — I10 Essential (primary) hypertension: Secondary | ICD-10-CM | POA: Diagnosis not present

## 2020-09-02 DIAGNOSIS — Z6830 Body mass index (BMI) 30.0-30.9, adult: Secondary | ICD-10-CM

## 2020-09-02 DIAGNOSIS — E669 Obesity, unspecified: Secondary | ICD-10-CM

## 2020-09-02 DIAGNOSIS — E8881 Metabolic syndrome: Secondary | ICD-10-CM

## 2020-09-02 DIAGNOSIS — E785 Hyperlipidemia, unspecified: Secondary | ICD-10-CM | POA: Diagnosis not present

## 2020-09-04 ENCOUNTER — Other Ambulatory Visit: Payer: Self-pay | Admitting: Internal Medicine

## 2020-09-07 ENCOUNTER — Other Ambulatory Visit: Payer: BC Managed Care – PPO | Admitting: Internal Medicine

## 2020-09-08 ENCOUNTER — Other Ambulatory Visit: Payer: Self-pay | Admitting: Internal Medicine

## 2020-09-08 ENCOUNTER — Ambulatory Visit: Payer: BC Managed Care – PPO | Admitting: Internal Medicine

## 2020-09-09 NOTE — Progress Notes (Signed)
Chief Complaint:   OBESITY Maria Mcpherson is here to discuss her progress with her obesity treatment plan along with follow-up of her obesity related diagnoses. Maria Mcpherson is on the Category 1 Plan and states she is following her eating plan approximately 70% of the time. Maria Mcpherson states she is not exercising at this time.   Today's visit was #: 2 Starting weight: 191 lbs Starting date: 08/19/2020 Today's weight: 188 lbs Today's date: 09/02/2020 Total lbs lost to date: 3 lbs Total lbs lost since last in-office visit: 3 lbs  Interim History: Maria Mcpherson is down 3 pounds since her last visit.  She struggled on the weekends at times.  She is taking her lunch to work.  Subjective:   1. Primary hypertension Controlled.  Review: taking medications as instructed, no medication side effects noted, no chest pain on exertion, no dyspnea on exertion, no swelling of ankles.  She is taking Norvasc 5 mg daily, Lopressor 50 mg daily, and Cozaar 100 mg daily.  BP Readings from Last 3 Encounters:  09/02/20 122/75  08/19/20 121/77  08/04/20 120/66   2. Hyperlipidemia, unspecified hyperlipidemia type Maria Mcpherson has hyperlipidemia and has been trying to improve her cholesterol levels with intensive lifestyle modification including a low saturated fat diet, exercise and weight loss. She denies any chest pain, claudication or myalgias.  She is taking Zocor 40 mg daily and fish oil-omega-3..  Lab Results  Component Value Date   ALT 25 08/03/2020   AST 25 08/03/2020   ALKPHOS 73 03/23/2017   BILITOT 0.5 08/03/2020   Lab Results  Component Value Date   CHOL 227 (H) 08/03/2020   HDL 65 08/03/2020   LDLCALC 129 (H) 08/03/2020   TRIG 193 (H) 08/03/2020   CHOLHDL 3.5 08/03/2020   3. Insulin resistance (mild) Maria Mcpherson has a diagnosis of insulin resistance based on her elevated fasting insulin level >5. She continues to work on diet and exercise to decrease her risk of diabetes.  Maria Mcpherson takes metformin 1,000 mg  daily with food.  Lab Results  Component Value Date   INSULIN 17.1 08/19/2020   Lab Results  Component Value Date   HGBA1C 6.2 (H) 08/03/2020   4. At risk for heart disease Maria Mcpherson is at a higher than average risk for cardiovascular disease due to obesity and hypertension.  Assessment/Plan:   1. Primary hypertension Maria Mcpherson is working on healthy weight loss and exercise to improve blood pressure control. We will watch for signs of hypotension as she continues her lifestyle modifications.  Continue medications.  2. Hyperlipidemia, unspecified hyperlipidemia type Cardiovascular risk and specific lipid/LDL goals reviewed.  We discussed several lifestyle modifications today and Maria Mcpherson will continue to work on diet, exercise and weight loss efforts. Orders and follow up as documented in patient record.  Continue Zocor and fish oil.  Counseling Intensive lifestyle modifications are the first line treatment for this issue.  Dietary changes: Increase soluble fiber. Decrease simple carbohydrates.  Exercise changes: Moderate to vigorous-intensity aerobic activity 150 minutes per week if tolerated.  Lipid-lowering medications: see documented in medical record.  3. Insulin resistance (mild) Maria Mcpherson will continue to work on weight loss, exercise, and decreasing simple carbohydrates to help decrease the risk of diabetes. Maria Mcpherson agreed to follow-up with [place] as directed to closely monitor her progress.  Continue metformin.  She has a supply and does not need a refill.  Always take with food.  4. At risk for heart disease Maria Mcpherson was given approximately 15 minutes of coronary artery disease  prevention counseling today. She is 65 y.o. female and has risk factors for heart disease including obesity. We discussed intensive lifestyle modifications today with an emphasis on specific weight loss instructions and strategies.   Repetitive spaced learning was employed today to elicit superior memory  formation and behavioral change.  5. Class 1 obesity with serious comorbidity and body mass index (BMI) of 30.0 to 30.9 in adult, unspecified obesity type  Maria Mcpherson is currently in the action stage of change. As such, her goal is to continue with weight loss efforts. She has agreed to the Category 1 Plan.   Maria Mcpherson will work on meal planning, intentional eating, will adhere closely to the plan, and will decrease her portion sizes.  Labs from 08/19/2020, including vitamin D, insulin level, T3, and T4 were reviewed today.  Exercise goals: Walking at lunch (and will continue).  Behavioral modification strategies: increasing lean protein intake, decreasing simple carbohydrates, increasing vegetables, increasing water intake, decreasing eating out, no skipping meals, meal planning and cooking strategies, keeping healthy foods in the home and planning for success.  Maria Mcpherson has agreed to follow-up with our clinic in 2 weeks with Dr. Jearld Shines. She was informed of the importance of frequent follow-up visits to maximize her success with intensive lifestyle modifications for her multiple health conditions.   Objective:   Blood pressure 122/75, pulse 67, temperature 98 F (36.7 C), temperature source Oral, height 5\' 6"  (1.676 m), weight 188 lb (85.3 kg), last menstrual period 12/04/2013, SpO2 98 %. Body mass index is 30.34 kg/m.  General: Cooperative, alert, well developed, in no acute distress. HEENT: Conjunctivae and lids unremarkable. Cardiovascular: Regular rhythm.  Lungs: Normal work of breathing. Neurologic: No focal deficits.   Lab Results  Component Value Date   CREATININE 0.89 08/03/2020   BUN 17 08/03/2020   NA 143 08/03/2020   K 4.4 08/03/2020   CL 101 08/03/2020   CO2 35 (H) 08/03/2020   Lab Results  Component Value Date   ALT 25 08/03/2020   AST 25 08/03/2020   ALKPHOS 73 03/23/2017   BILITOT 0.5 08/03/2020   Lab Results  Component Value Date   HGBA1C 6.2 (H) 08/03/2020    HGBA1C 5.7 (H) 11/11/2019   HGBA1C 5.6 05/13/2019   HGBA1C 6.0 (H) 09/25/2018   HGBA1C 5.9 (H) 03/27/2018   Lab Results  Component Value Date   INSULIN 17.1 08/19/2020   Lab Results  Component Value Date   TSH 1.41 08/03/2020   Lab Results  Component Value Date   CHOL 227 (H) 08/03/2020   HDL 65 08/03/2020   LDLCALC 129 (H) 08/03/2020   TRIG 193 (H) 08/03/2020   CHOLHDL 3.5 08/03/2020   Lab Results  Component Value Date   WBC 4.2 08/03/2020   HGB 10.8 (L) 08/03/2020   HCT 34.5 (L) 08/03/2020   MCV 83.5 08/03/2020   PLT 286 08/03/2020   Lab Results  Component Value Date   IRON 49 08/03/2020   TIBC 414 08/03/2020   FERRITIN 9 (L) 08/03/2020   Attestation Statements:   Reviewed by clinician on day of visit: allergies, medications, problem list, medical history, surgical history, family history, social history, and previous encounter notes.  I, Water quality scientist, CMA, am acting as Location manager for [employer], DO  I have reviewed the above documentation for accuracy and completeness, and I agree with the above. Jearld Lesch, DO

## 2020-09-10 ENCOUNTER — Encounter (INDEPENDENT_AMBULATORY_CARE_PROVIDER_SITE_OTHER): Payer: Self-pay | Admitting: Bariatrics

## 2020-09-17 ENCOUNTER — Encounter (INDEPENDENT_AMBULATORY_CARE_PROVIDER_SITE_OTHER): Payer: Self-pay | Admitting: Family Medicine

## 2020-09-17 ENCOUNTER — Ambulatory Visit (INDEPENDENT_AMBULATORY_CARE_PROVIDER_SITE_OTHER): Payer: BC Managed Care – PPO | Admitting: Family Medicine

## 2020-09-17 ENCOUNTER — Other Ambulatory Visit: Payer: Self-pay

## 2020-09-17 VITALS — BP 127/78 | HR 60 | Temp 98.1°F | Ht 66.0 in | Wt 182.0 lb

## 2020-09-17 DIAGNOSIS — E785 Hyperlipidemia, unspecified: Secondary | ICD-10-CM

## 2020-09-17 DIAGNOSIS — Z6830 Body mass index (BMI) 30.0-30.9, adult: Secondary | ICD-10-CM

## 2020-09-17 DIAGNOSIS — E669 Obesity, unspecified: Secondary | ICD-10-CM | POA: Diagnosis not present

## 2020-09-17 DIAGNOSIS — R7303 Prediabetes: Secondary | ICD-10-CM

## 2020-09-21 NOTE — Progress Notes (Signed)
Chief Complaint:   OBESITY Maria Mcpherson is here to discuss her progress with her obesity treatment plan along with follow-up of her obesity related diagnoses. Maria Mcpherson is on the Category 1 Plan and states she is following her eating plan approximately 50% of the time. Maria Mcpherson states she has walked some.  Today's visit was #: 3 Starting weight: 191 lbs Starting date: 08/19/2020 Today's weight: 182 lbs Today's date: 09/17/2020 Total lbs lost to date: 9 lbs Total lbs lost since last in-office visit: 6 lbs  Interim History: [name] has had a very very stressful last few weeks as her husband has had a severe accident and is in a coma at [facility] and will be getting a trach tomorrow. Pt has been slightly more constipated. She reports no emotional eating and no hunger.  Subjective:   1. Pre-diabetes Qu[place]aly tried to increase Metformin to 1000 mg but [place] experiencing significant GI side effects.  2. Hyperlipidemia, unspecified hyperlipidemia type Maria Mcpherson's last LDL 129, HDL 65, and triglycerides 193. She is on Zocor 40 mg daily. She denies transaminitis.   Assessment/Plan:   1. Pre-diabetes Maria Mcpherson will continue to work on weight loss, exercise, and decreasing simple carbohydrates to help decrease the risk of diabetes. Continue Metformin 500 mg daily.  2. Hyperlipidemia, unspecified hyperlipidemia type Cardiovascular risk and specific lipid/LDL goals reviewed.  We discussed several lifestyle modifications today and Demetrice will continue to work on diet, exercise and weight loss efforts. Orders and follow up as documented in patient record. Continue Zocor. Repeat FLP in 3 months. If LDL is still elevated, consider transition to different statin.  Counseling Intensive lifestyle modifications are the first line treatment for this issue.  Dietary changes: Increase soluble fiber. Decrease simple carbohydrates.  Exercise changes: Moderate to vigorous-intensity aerobic activity 150 minutes per  week if tolerated.  Lipid-lowering medications: see documented in medical record.  3. Class 1 obesity with serious comorbidity and body mass index (BMI) of 30.0 to 30.9 in adult, unspecified obesity type Maria Mcpherson is currently in the action stage of change. As such, her goal is to continue with weight loss efforts. She has agreed to the Category 1 Plan.   Exercise goals: As is  Behavioral modification strategies: increasing lean protein intake, meal planning and cooking strategies, keeping healthy foods in the home and planning for success.  [name] has agreed to follow-up with our clinic in 2 weeks. She was informed of the importance of frequent follow-up visits to maximize her success with intensive lifestyle modifications for her multiple health conditions.   Objective:   Blood pressure 127/78, pulse 60, temperature 98.1 F (36.7 C), temperature source Oral, height 5\' 6"  (1.676 m), weight 182 lb (82.6 kg), last menstrual period 12/04/2013, SpO2 99 %. Body mass index is 29.38 kg/m.  General: Cooperative, alert, well developed, in no acute distress. HEENT: Conjunctivae and lids unremarkable. Cardiovascular: Regular rhythm.  Lungs: Normal work of breathing. Neurologic: No focal deficits.   Lab Results  Component Value Date   CREATININE 0.89 08/03/2020   BUN 17 08/03/2020   NA 143 08/03/2020   K 4.4 08/03/2020   CL 101 08/03/2020   CO2 35 (H) 08/03/2020   Lab Results  Component Value Date   ALT 25 08/03/2020   AST 25 08/03/2020   ALKPHOS 73 03/23/2017   BILITOT 0.5 08/03/2020   Lab Results  Component Value Date   HGBA1C 6.2 (H) 08/03/2020   HGBA1C 5.7 (H) 11/11/2019   HGBA1C 5.6 05/13/2019   HGBA1C  6.0 (H) 09/25/2018   HGBA1C 5.9 (H) 03/27/2018   Lab Results  Component Value Date   INSULIN 17.1 08/19/2020   Lab Results  Component Value Date   TSH 1.41 08/03/2020   Lab Results  Component Value Date   CHOL 227 (H) 08/03/2020   HDL 65 08/03/2020   LDLCALC 129  (H) 08/03/2020   TRIG 193 (H) 08/03/2020   CHOLHDL 3.5 08/03/2020   Lab Results  Component Value Date   WBC 4.2 08/03/2020   HGB 10.8 (L) 08/03/2020   HCT 34.5 (L) 08/03/2020   MCV 83.5 08/03/2020   PLT 286 08/03/2020   Lab Results  Component Value Date   IRON 49 08/03/2020   TIBC 414 08/03/2020   FERRITIN 9 (L) 08/03/2020   Attestation Statements:   Reviewed by clinician on day of visit: allergies, medications, problem list, medical history, surgical history, family history, social history, and previous encounter notes.  Time spent on visit including pre-visit chart review and post-visit care and charting was 15 minutes.   Coral Ceo, am acting as transcriptionist for Coralie Common, MD.   I have reviewed the above documentation for accuracy and completeness, and I agree with the above. - Jinny Blossom, MD

## 2020-10-01 ENCOUNTER — Other Ambulatory Visit: Payer: Self-pay

## 2020-10-01 ENCOUNTER — Encounter (INDEPENDENT_AMBULATORY_CARE_PROVIDER_SITE_OTHER): Payer: Self-pay | Admitting: Family Medicine

## 2020-10-01 ENCOUNTER — Ambulatory Visit (INDEPENDENT_AMBULATORY_CARE_PROVIDER_SITE_OTHER): Payer: BC Managed Care – PPO | Admitting: Family Medicine

## 2020-10-01 VITALS — BP 114/70 | HR 68 | Temp 98.6°F | Ht 66.0 in | Wt 181.0 lb

## 2020-10-01 DIAGNOSIS — Z6830 Body mass index (BMI) 30.0-30.9, adult: Secondary | ICD-10-CM | POA: Diagnosis not present

## 2020-10-01 DIAGNOSIS — K5900 Constipation, unspecified: Secondary | ICD-10-CM

## 2020-10-01 DIAGNOSIS — Z1231 Encounter for screening mammogram for malignant neoplasm of breast: Secondary | ICD-10-CM | POA: Diagnosis not present

## 2020-10-01 DIAGNOSIS — E669 Obesity, unspecified: Secondary | ICD-10-CM

## 2020-10-01 DIAGNOSIS — I10 Essential (primary) hypertension: Secondary | ICD-10-CM | POA: Diagnosis not present

## 2020-10-01 LAB — HM MAMMOGRAPHY

## 2020-10-02 ENCOUNTER — Encounter: Payer: Self-pay | Admitting: Internal Medicine

## 2020-10-06 NOTE — Progress Notes (Signed)
Chief Complaint:   OBESITY Maria Mcpherson is here to discuss her progress with her obesity treatment plan along with follow-up of her obesity related diagnoses. Maria Mcpherson is on the Category 1 Plan and states she is following her eating plan approximately 70% of the time. Maria Mcpherson states she is walking 30 minutes 5 times per week.  Today's visit was #: 4 Starting weight: 191 lbs Starting date: 08/19/2020 Today's weight: 181 lbs Today's date: 10/01/2020 Total lbs lost to date: 10 lbs Total lbs lost since last in-office visit: 1 lb  Interim History: Pt's husband is still in hospital and out of ICU. She says he is ~ 80% cognizant and 20% confused. She has started walking and has been eating more on the plan but hasn't been controlling portions. She thinks she may have eaten more starches.  Subjective:   1. Constipation, unspecified constipation type Pt has been on Miralax now for 1 week with very little improvement in constipation.  2. Primary hypertension Pt's BP is well controlled today. She is on Cozaar, lopressor, and Norvasc.  BP Readings from Last 3 Encounters:  10/01/20 114/70  09/17/20 127/78  09/02/20 122/75    Assessment/Plan:   1. Constipation, unspecified constipation type Maria Mcpherson was informed that a decrease in bowel movement frequency is normal while losing weight, but stools should not be hard or painful. Orders and follow up as documented in patient record. Increase Miralax to 2-3 times a day if necessary.  Counseling Getting to Good Bowel Health: Your goal is to have one soft bowel movement each day. Drink at least 8 glasses of water each day. Eat plenty of fiber (goal is over 25 grams each day). It is best to get most of your fiber from dietary sources which includes leafy green vegetables, fresh fruit, and whole grains. You may need to add fiber with the help of OTC fiber supplements. These include Metamucil, Citrucel, and Flaxseed. If you are still having trouble, try  adding Miralax or Magnesium Citrate. If all of these changes do not work, Cabin crew.  2. Primary hypertension Maria Mcpherson is working on healthy weight loss and exercise to improve blood pressure control. We will watch for signs of hypotension as she continues her lifestyle modifications. Follow up BP at next appointment. If BP is still controlled, consider decreasing amlodipine.  3. Class 1 obesity with serious comorbidity and body mass index (BMI) of 30.0 to 30.9 in adult, unspecified obesity type Maria Mcpherson is currently in the action stage of change. As such, her goal is to continue with weight loss efforts. She has agreed to the Category 1 Plan with breakfast options.   Exercise goals: No exercise has been prescribed at this time.  Behavioral modification strategies: increasing lean protein intake, meal planning and cooking strategies and keeping healthy foods in the home.  Maria Mcpherson has agreed to follow-up with our clinic in 2 weeks. She was informed of the importance of frequent follow-up visits to maximize her success with intensive lifestyle modifications for her multiple health conditions.   Objective:   Blood pressure 114/70, pulse 68, temperature 98.6 F (37 C), temperature source Oral, height 5\' 6"  (1.676 m), weight 181 lb (82.1 kg), last menstrual period 12/04/2013, SpO2 98 %. Body mass index is 29.21 kg/m.  General: Cooperative, alert, well developed, in no acute distress. HEENT: Conjunctivae and lids unremarkable. Cardiovascular: Regular rhythm.  Lungs: Normal work of breathing. Neurologic: No focal deficits.   Lab Results  Component Value Date   CREATININE  0.89 08/03/2020   BUN 17 08/03/2020   NA 143 08/03/2020   K 4.4 08/03/2020   CL 101 08/03/2020   CO2 35 (H) 08/03/2020   Lab Results  Component Value Date   ALT 25 08/03/2020   AST 25 08/03/2020   ALKPHOS 73 03/23/2017   BILITOT 0.5 08/03/2020   Lab Results  Component Value Date   HGBA1C 6.2 (H)  08/03/2020   HGBA1C 5.7 (H) 11/11/2019   HGBA1C 5.6 05/13/2019   HGBA1C 6.0 (H) 09/25/2018   HGBA1C 5.9 (H) 03/27/2018   Lab Results  Component Value Date   INSULIN 17.1 08/19/2020   Lab Results  Component Value Date   TSH 1.41 08/03/2020   Lab Results  Component Value Date   CHOL 227 (H) 08/03/2020   HDL 65 08/03/2020   LDLCALC 129 (H) 08/03/2020   TRIG 193 (H) 08/03/2020   CHOLHDL 3.5 08/03/2020   Lab Results  Component Value Date   WBC 4.2 08/03/2020   HGB 10.8 (L) 08/03/2020   HCT 34.5 (L) 08/03/2020   MCV 83.5 08/03/2020   PLT 286 08/03/2020   Lab Results  Component Value Date   IRON 49 08/03/2020   TIBC 414 08/03/2020   FERRITIN 9 (L) 08/03/2020    Attestation Statements:   Reviewed by clinician on day of visit: allergies, medications, problem list, medical history, surgical history, family history, social history, and previous encounter notes.  Time spent on visit including pre-visit chart review and post-visit care and charting was 15 minutes.   Coral Ceo, am acting as transcriptionist for Coralie Common, MD.   I have reviewed the above documentation for accuracy and completeness, and I agree with the above. - Jinny Blossom, MD

## 2020-10-20 DIAGNOSIS — G4733 Obstructive sleep apnea (adult) (pediatric): Secondary | ICD-10-CM | POA: Diagnosis not present

## 2020-10-21 ENCOUNTER — Ambulatory Visit (INDEPENDENT_AMBULATORY_CARE_PROVIDER_SITE_OTHER): Payer: BC Managed Care – PPO | Admitting: Family Medicine

## 2020-10-27 ENCOUNTER — Other Ambulatory Visit: Payer: Self-pay

## 2020-10-27 ENCOUNTER — Encounter (INDEPENDENT_AMBULATORY_CARE_PROVIDER_SITE_OTHER): Payer: Self-pay | Admitting: Family Medicine

## 2020-10-27 ENCOUNTER — Ambulatory Visit (INDEPENDENT_AMBULATORY_CARE_PROVIDER_SITE_OTHER): Payer: BC Managed Care – PPO | Admitting: Family Medicine

## 2020-10-27 VITALS — BP 127/82 | HR 71 | Temp 98.5°F | Ht 66.0 in | Wt 178.0 lb

## 2020-10-27 DIAGNOSIS — E669 Obesity, unspecified: Secondary | ICD-10-CM | POA: Diagnosis not present

## 2020-10-27 DIAGNOSIS — Z6830 Body mass index (BMI) 30.0-30.9, adult: Secondary | ICD-10-CM | POA: Diagnosis not present

## 2020-10-27 DIAGNOSIS — I10 Essential (primary) hypertension: Secondary | ICD-10-CM

## 2020-10-27 DIAGNOSIS — E785 Hyperlipidemia, unspecified: Secondary | ICD-10-CM

## 2020-11-01 NOTE — Progress Notes (Signed)
Chief Complaint:   OBESITY Maria Mcpherson is here to discuss her progress with her obesity treatment plan along with follow-up of her obesity related diagnoses. Maria Mcpherson is on the Category 1 Plan with breakfast options and states she is following her eating plan approximately 70% of the time. Maria Mcpherson states she is walking 30 minutes 3-4 times per week.  Today's visit was #: 5 Starting weight: 191 lbs Starting date: 08/19/2020 Today's weight: 178 lbs Today's date: 10/27/2020 Total lbs lost to date: 13 lbs Total lbs lost since last in-office visit: 3  Interim History: Last few weeks pt has been traveling to [facility] to see husband who had accident a few months ago. She is renting a place near him to spend with him after work. She's been eating quite a bit of [employer] of grilled chicken nuggets and kale salad.  Subjective:   1. Primary hypertension Maria Mcpherson's BP is well controlled today.  Pt denies chest pain, chest pressure and headache.  2. Hyperlipidemia, unspecified hyperlipidemia type Maria Mcpherson is on Zocor 40 mg and denies myalgias.  Assessment/Plan:   1. Primary hypertension Maria Mcpherson is working on healthy weight loss and exercise to improve blood pressure control. We will watch for signs of hypotension as she continues her lifestyle modifications. Continue current meds- if controlled at next appointment we will decrease amlodipine.  2. Hyperlipidemia, unspecified hyperlipidemia type Cardiovascular risk and specific lipid/LDL goals reviewed.  We discussed several lifestyle modifications today and Maria Mcpherson will continue to work on diet, exercise and weight loss efforts. Orders and follow up as documented in patient record. Continue Zocor with no change in dose.  Counseling Intensive lifestyle modifications are the first line treatment for this issue. . Dietary changes: Increase soluble fiber. Decrease simple carbohydrates. . Exercise changes: Moderate to vigorous-intensity aerobic  activity 150 minutes per week if tolerated. . Lipid-lowering medications: see documented in medical record.  3. Class 1 obesity with serious comorbidity and body mass index (BMI) of 30.0 to 30.9 in adult, unspecified obesity type Maria Mcpherson is currently in the action stage of change. As such, her goal is to continue with weight loss efforts. She has agreed to the Category 1 Plan and keeping a food journal and adhering to recommended goals of 350-450 calories and 30+ g protein with supper.   Exercise goals: As is  Behavioral modification strategies: increasing lean protein intake, meal planning and cooking strategies, keeping healthy foods in the home and planning for success.  Maria Mcpherson has agreed to follow-up with our clinic in 3 weeks. She was informed of the importance of frequent follow-up visits to maximize her success with intensive lifestyle modifications for her multiple health conditions.   Objective:   Blood pressure 127/82, pulse 71, temperature 98.5 F (36.9 C), height 5\' 6"  (1.676 m), weight 178 lb (80.7 kg), last menstrual period 12/04/2013, SpO2 98 %. Body mass index is 28.73 kg/m.  General: Cooperative, alert, well developed, in no acute distress. HEENT: Conjunctivae and lids unremarkable. Cardiovascular: Regular rhythm.  Lungs: Normal work of breathing. Neurologic: No focal deficits.   Lab Results  Component Value Date   CREATININE 0.89 08/03/2020   BUN 17 08/03/2020   NA 143 08/03/2020   K 4.4 08/03/2020   CL 101 08/03/2020   CO2 35 (H) 08/03/2020   Lab Results  Component Value Date   ALT 25 08/03/2020   AST 25 08/03/2020   ALKPHOS 73 03/23/2017   BILITOT 0.5 08/03/2020   Lab Results  Component Value Date  HGBA1C 6.2 (H) 08/03/2020   HGBA1C 5.7 (H) 11/11/2019   HGBA1C 5.6 05/13/2019   HGBA1C 6.0 (H) 09/25/2018   HGBA1C 5.9 (H) 03/27/2018   Lab Results  Component Value Date   INSULIN 17.1 08/19/2020   Lab Results  Component Value Date   TSH 1.41  08/03/2020   Lab Results  Component Value Date   CHOL 227 (H) 08/03/2020   HDL 65 08/03/2020   LDLCALC 129 (H) 08/03/2020   TRIG 193 (H) 08/03/2020   CHOLHDL 3.5 08/03/2020   Lab Results  Component Value Date   WBC 4.2 08/03/2020   HGB 10.8 (L) 08/03/2020   HCT 34.5 (L) 08/03/2020   MCV 83.5 08/03/2020   PLT 286 08/03/2020   Lab Results  Component Value Date   IRON 49 08/03/2020   TIBC 414 08/03/2020   FERRITIN 9 (L) 08/03/2020     Attestation Statements:   Reviewed by clinician on day of visit: allergies, medications, problem list, medical history, surgical history, family history, social history, and previous encounter notes.   Coral Ceo, am acting as transcriptionist for Coralie Common, MD.   I have reviewed the above documentation for accuracy and completeness, and I agree with the above. - Jinny Blossom, MD

## 2020-11-18 ENCOUNTER — Ambulatory Visit (INDEPENDENT_AMBULATORY_CARE_PROVIDER_SITE_OTHER): Payer: BC Managed Care – PPO | Admitting: Family Medicine

## 2020-11-24 ENCOUNTER — Ambulatory Visit (INDEPENDENT_AMBULATORY_CARE_PROVIDER_SITE_OTHER): Payer: BC Managed Care – PPO | Admitting: Family Medicine

## 2020-12-03 ENCOUNTER — Encounter (INDEPENDENT_AMBULATORY_CARE_PROVIDER_SITE_OTHER): Payer: Self-pay | Admitting: Family Medicine

## 2020-12-03 DIAGNOSIS — G4733 Obstructive sleep apnea (adult) (pediatric): Secondary | ICD-10-CM | POA: Diagnosis not present

## 2020-12-09 ENCOUNTER — Encounter (INDEPENDENT_AMBULATORY_CARE_PROVIDER_SITE_OTHER): Payer: Self-pay | Admitting: Family Medicine

## 2020-12-09 ENCOUNTER — Other Ambulatory Visit: Payer: Self-pay

## 2020-12-09 ENCOUNTER — Ambulatory Visit (INDEPENDENT_AMBULATORY_CARE_PROVIDER_SITE_OTHER): Payer: BC Managed Care – PPO | Admitting: Family Medicine

## 2020-12-09 VITALS — BP 108/72 | HR 76 | Temp 98.5°F | Ht 66.0 in | Wt 172.0 lb

## 2020-12-09 DIAGNOSIS — Z6830 Body mass index (BMI) 30.0-30.9, adult: Secondary | ICD-10-CM | POA: Diagnosis not present

## 2020-12-09 DIAGNOSIS — E669 Obesity, unspecified: Secondary | ICD-10-CM | POA: Diagnosis not present

## 2020-12-09 DIAGNOSIS — E66811 Obesity, class 1: Secondary | ICD-10-CM

## 2020-12-09 DIAGNOSIS — R7303 Prediabetes: Secondary | ICD-10-CM

## 2020-12-09 DIAGNOSIS — Z9189 Other specified personal risk factors, not elsewhere classified: Secondary | ICD-10-CM

## 2020-12-09 DIAGNOSIS — I10 Essential (primary) hypertension: Secondary | ICD-10-CM

## 2020-12-09 MED ORDER — AMLODIPINE BESYLATE 5 MG PO TABS: 0.5000 | ORAL_TABLET | Freq: Every day | ORAL | 0 refills | Status: DC

## 2020-12-10 ENCOUNTER — Ambulatory Visit (INDEPENDENT_AMBULATORY_CARE_PROVIDER_SITE_OTHER): Payer: BC Managed Care – PPO | Admitting: Internal Medicine

## 2020-12-10 ENCOUNTER — Encounter: Payer: Self-pay | Admitting: Internal Medicine

## 2020-12-10 VITALS — HR 84 | Temp 98.2°F

## 2020-12-10 DIAGNOSIS — E785 Hyperlipidemia, unspecified: Secondary | ICD-10-CM

## 2020-12-10 DIAGNOSIS — E1159 Type 2 diabetes mellitus with other circulatory complications: Secondary | ICD-10-CM | POA: Diagnosis not present

## 2020-12-10 DIAGNOSIS — U071 COVID-19: Secondary | ICD-10-CM | POA: Diagnosis not present

## 2020-12-10 DIAGNOSIS — Z8639 Personal history of other endocrine, nutritional and metabolic disease: Secondary | ICD-10-CM

## 2020-12-10 DIAGNOSIS — J0100 Acute maxillary sinusitis, unspecified: Secondary | ICD-10-CM

## 2020-12-10 DIAGNOSIS — G4733 Obstructive sleep apnea (adult) (pediatric): Secondary | ICD-10-CM

## 2020-12-10 DIAGNOSIS — I152 Hypertension secondary to endocrine disorders: Secondary | ICD-10-CM

## 2020-12-10 MED ORDER — AZITHROMYCIN 250 MG PO TABS: ORAL_TABLET | ORAL | 0 refills | Status: AC

## 2020-12-10 MED ORDER — PREDNISONE 10 MG PO TABS: ORAL_TABLET | ORAL | 0 refills | Status: DC

## 2020-12-10 NOTE — Patient Instructions (Signed)
Take Zithromax Z-PAK 2 tabs day 1 followed by 1 tab days 2 through 5.  Take prednisone tapering course starting with 610 mg tablets daily and decreasing by 10 mg daily i.e. 6-5-4-3-2-1 taper.  Follow-up here in July for medical issues.  Call if not improving in the next 5 to 7 days or sooner if worse.  Stay well-hydrated and walk some.  Watch pulse oximetry.

## 2020-12-10 NOTE — Progress Notes (Signed)
   Subjective:    Patient ID: Maria Mcpherson, female    DOB: Sep 26, 1955, 65 y.o.   MRN: [id number]  HPI 65 year old Female seen today with Covid-19 diagnosed about a week ago.  Has discolored  congestion.  Currently no fever or shaking chills.  Her head feels congested.  She is seen at [facility].  She is lost about 20 pounds since January.    History of essential hypertension, vitamin D deficiency, obstructive sleep apnea, hyperlipidemia, impaired glucose tolerance and dependent edema.  History of left hip arthroplasty 2017 by Dr. Ninfa Linden.  History of iron deficiency diagnosed in January 2022.  She has follow-up here in early July.    Review of Systems see above-no nausea vomiting dysgeusia shaking chills     Objective:   Physical Exam  Pulse 84 and regular temperature 98.2 degrees pulse oximetry 99%  Skin warm and dry.  Pharynx slightly injected.  TMs clear.  Chest clear to auscultation without rales or wheezing.  She sounds nasally congested when she speaks.      Assessment & Plan:  Acute maxillary sinusitis  COVID-19 virus infection  Impaired glucose tolerance treated with metformin  History of iron deficiency-needs follow-up in July.  This was an acute visit today.  She also needs repeat colonoscopy.  Hypertension treated with Lasix, losartan, metoprolol and amlodipine  Hyperlipidemia treated with Zocor  Plan: She will take Zithromax Z-PAK 2 tabs day 1 followed by 1 tab days 2 through 5.  Started patient on prednisone 10 mg starting with 6 tablets day 1 and decreasing by 10 mg daily i.e. 6-5-4-3-2 -1 taper.  She has a follow-up visit here in July for medical issues.  She will call if not improving over the next 5 to 7 days or sooner if worse.

## 2020-12-14 ENCOUNTER — Telehealth: Payer: Self-pay | Admitting: Internal Medicine

## 2020-12-14 NOTE — Telephone Encounter (Signed)
Faxed Positive Home Covid results to [place] [phone]

## 2020-12-22 NOTE — Progress Notes (Signed)
Chief Complaint:   OBESITY Maria Mcpherson is here to discuss her progress with her obesity treatment plan along with follow-up of her obesity related diagnoses. [name] is on the Category 1 Plan and keeping a food journal and adhering to recommended goals of 350-450 calories and 30 g protein with supper and states she is following her eating plan approximately 50% of the time. [name] states she is walking and doing fitness classes on phone 30-40 minutes 6 times per week.  Today's visit was #: 6 Starting weight: 191 lbs Starting date: 08/19/2020 Today's weight: 172 lbs Today's date: 12/09/2020 Total lbs lost to date: 19 lbs Total lbs lost since last in-office visit: 6  Interim History: Maria Mcpherson's husband got out of the hospital at the end of April and she and her husband got COVID. Prior to getting sick, she was doing well with eating plan and had started an exercise regimen. She is still fairly congested. Pt has no plans for her birthday, except possibly going [place] the lake.  Subjective:   1. Essential hypertension Teona's BP is very well controlled. Pt denies chest pain/chest pressure/headache. She is on Norvasc, Cozaar, and Lopressor.  2. Pre-diabetes Last A1c 6.2 and insulin level 17.1. Maria Mcpherson is still taking Metformin 500 mg.  3. At risk for complication associated with hypotension  The patient is at a higher than average risk of hypotension due to weight loss and current medications.  Assessment/Plan:   1. Essential hypertension Randall is working on healthy weight loss and exercise to improve blood pressure control. We will watch for signs of hypotension as she continues her lifestyle modifications. -Decrease amlodipine to 2.5 mg daily. No print.  2. Pre-diabetes Maria Mcpherson will continue to work on weight loss, exercise, and decreasing simple carbohydrates to help decrease the risk of diabetes. Continue current treatment plan.  3. At risk for complication associated with  hypotension Maria Mcpherson was given approximately 15 minutes of education and counseling today to help avoid hypotension. We discussed risks of hypotension with weight loss and signs of hypotension such as feeling lightheaded or unsteady.  Repetitive spaced learning was employed today to elicit superior memory formation and behavioral change.  4. Obesity with current BMI of 27.9 Maria Mcpherson is currently in the action stage of change. As such, her goal is to continue with weight loss efforts. She has agreed to the Category 1 Plan.   Exercise goals: No exercise has been prescribed at this time.  Behavioral modification strategies: increasing lean protein intake, meal planning and cooking strategies, keeping healthy foods in the home and planning for success.  Maria Mcpherson has agreed to follow-up with our clinic in 3 weeks- fasting. She was informed of the importance of frequent follow-up visits to maximize her success with intensive lifestyle modifications for her multiple health conditions.   Objective:   Blood pressure 108/72, pulse 76, temperature 98.5 F (36.9 C), height 5\' 6"  (1.676 m), weight 172 lb (78 kg), last menstrual period 12/04/2013, SpO2 98 %. Body mass index is 27.76 kg/m.  General: Cooperative, alert, well developed, in no acute distress. HEENT: Conjunctivae and lids unremarkable. Cardiovascular: Regular rhythm.  Lungs: Normal work of breathing. Neurologic: No focal deficits.   Lab Results  Component Value Date   CREATININE 0.89 08/03/2020   BUN 17 08/03/2020   NA 143 08/03/2020   K 4.4 08/03/2020   CL 101 08/03/2020   CO2 35 (H) 08/03/2020   Lab Results  Component Value Date   ALT 25 08/03/2020  AST 25 08/03/2020   ALKPHOS 73 03/23/2017   BILITOT 0.5 08/03/2020   Lab Results  Component Value Date   HGBA1C 6.2 (H) 08/03/2020   HGBA1C 5.7 (H) 11/11/2019   HGBA1C 5.6 05/13/2019   HGBA1C 6.0 (H) 09/25/2018   HGBA1C 5.9 (H) 03/27/2018   Lab Results  Component Value  Date   INSULIN 17.1 08/19/2020   Lab Results  Component Value Date   TSH 1.41 08/03/2020   Lab Results  Component Value Date   CHOL 227 (H) 08/03/2020   HDL 65 08/03/2020   LDLCALC 129 (H) 08/03/2020   TRIG 193 (H) 08/03/2020   CHOLHDL 3.5 08/03/2020   Lab Results  Component Value Date   WBC 4.2 08/03/2020   HGB 10.8 (L) 08/03/2020   HCT 34.5 (L) 08/03/2020   MCV 83.5 08/03/2020   PLT 286 08/03/2020   Lab Results  Component Value Date   IRON 49 08/03/2020   TIBC 414 08/03/2020   FERRITIN 9 (L) 08/03/2020    Attestation Statements:   Reviewed by clinician on day of visit: allergies, medications, problem list, medical history, surgical history, family history, social history, and previous encounter notes.  Coral Ceo, CMA, am acting as transcriptionist for Coralie Common, MD.   I have reviewed the above documentation for accuracy and completeness, and I agree with the above. - Jinny Blossom, MD

## 2021-01-04 ENCOUNTER — Ambulatory Visit (INDEPENDENT_AMBULATORY_CARE_PROVIDER_SITE_OTHER): Payer: BC Managed Care – PPO | Admitting: Family Medicine

## 2021-01-05 ENCOUNTER — Other Ambulatory Visit: Payer: Self-pay

## 2021-01-05 ENCOUNTER — Ambulatory Visit (INDEPENDENT_AMBULATORY_CARE_PROVIDER_SITE_OTHER): Payer: BC Managed Care – PPO | Admitting: Bariatrics

## 2021-01-05 ENCOUNTER — Other Ambulatory Visit (INDEPENDENT_AMBULATORY_CARE_PROVIDER_SITE_OTHER): Payer: Self-pay | Admitting: Bariatrics

## 2021-01-05 ENCOUNTER — Encounter (INDEPENDENT_AMBULATORY_CARE_PROVIDER_SITE_OTHER): Payer: Self-pay | Admitting: Bariatrics

## 2021-01-05 VITALS — BP 132/85 | HR 57 | Temp 98.1°F | Ht 66.0 in | Wt 172.0 lb

## 2021-01-05 DIAGNOSIS — Z9189 Other specified personal risk factors, not elsewhere classified: Secondary | ICD-10-CM | POA: Diagnosis not present

## 2021-01-05 DIAGNOSIS — R7303 Prediabetes: Secondary | ICD-10-CM

## 2021-01-05 DIAGNOSIS — E669 Obesity, unspecified: Secondary | ICD-10-CM

## 2021-01-05 DIAGNOSIS — E785 Hyperlipidemia, unspecified: Secondary | ICD-10-CM

## 2021-01-05 DIAGNOSIS — Z8639 Personal history of other endocrine, nutritional and metabolic disease: Secondary | ICD-10-CM | POA: Diagnosis not present

## 2021-01-05 DIAGNOSIS — Z6830 Body mass index (BMI) 30.0-30.9, adult: Secondary | ICD-10-CM

## 2021-01-05 DIAGNOSIS — E559 Vitamin D deficiency, unspecified: Secondary | ICD-10-CM

## 2021-01-05 DIAGNOSIS — R790 Abnormal level of blood mineral: Secondary | ICD-10-CM

## 2021-01-06 LAB — CBC WITH DIFFERENTIAL/PLATELET
Basophils Absolute: 0 10*3/uL (ref 0.0–0.2)
Basos: 1 %
EOS (ABSOLUTE): 0.2 10*3/uL (ref 0.0–0.4)
Eos: 5 %
Hemoglobin: 12.7 g/dL (ref 11.1–15.9)
Immature Grans (Abs): 0 10*3/uL (ref 0.0–0.1)
Immature Granulocytes: 0 %
Lymphocytes Absolute: 1.3 10*3/uL (ref 0.7–3.1)
Lymphs: 33 %
MCH: 27.1 pg (ref 26.6–33.0)
MCHC: 31.9 g/dL (ref 31.5–35.7)
MCV: 85 fL (ref 79–97)
Monocytes Absolute: 0.4 10*3/uL (ref 0.1–0.9)
Monocytes: 10 %
Neutrophils Absolute: 2.1 10*3/uL (ref 1.4–7.0)
Neutrophils: 51 %
Platelets: 239 10*3/uL (ref 150–450)
RBC: 4.69 x10E6/uL (ref 3.77–5.28)
RDW: 16.3 % — ABNORMAL HIGH (ref 11.7–15.4)
WBC: 4 10*3/uL (ref 3.4–10.8)

## 2021-01-06 LAB — ANEMIA PANEL
Ferritin: 22 ng/mL (ref 15–150)
Folate, Hemolysate: 540 ng/mL
Folate, RBC: 1357 ng/mL (ref >498)
Hematocrit: 39.8 % (ref 34.0–46.6)
Iron Saturation: 39 % (ref 15–55)
Iron: 142 ug/dL — ABNORMAL HIGH (ref 27–139)
Retic Ct Pct: 2 % (ref 0.6–2.6)
Total Iron Binding Capacity: 367 ug/dL (ref 250–450)
UIBC: 225 ug/dL (ref 118–369)
Vitamin B-12: 785 pg/mL (ref 232–1245)

## 2021-01-06 LAB — LIPID PANEL WITH LDL/HDL RATIO
Cholesterol, Total: 203 mg/dL — ABNORMAL HIGH (ref 100–199)
HDL: 68 mg/dL (ref >39)
LDL Chol Calc (NIH): 115 mg/dL — ABNORMAL HIGH (ref 0–99)
LDL/HDL Ratio: 1.7 ratio (ref 0.0–3.2)
Triglycerides: 113 mg/dL (ref 0–149)
VLDL Cholesterol Cal: 20 mg/dL (ref 5–40)

## 2021-01-06 LAB — INSULIN, RANDOM: INSULIN: 11.7 u[IU]/mL (ref 2.6–24.9)

## 2021-01-06 LAB — HEMOGLOBIN A1C
Est. average glucose Bld gHb Est-mCnc: 128 mg/dL
Hgb A1c MFr Bld: 6.1 % — ABNORMAL HIGH (ref 4.8–5.6)

## 2021-01-06 LAB — VITAMIN D 25 HYDROXY (VIT D DEFICIENCY, FRACTURES): Vit D, 25-Hydroxy: 37.4 ng/mL (ref 30.0–100.0)

## 2021-01-07 NOTE — Progress Notes (Signed)
Chief Complaint:   OBESITY Maria Mcpherson is here to discuss her progress with her obesity treatment plan along with follow-up of her obesity related diagnoses. Maria Mcpherson is on the Category 1 Plan and states she is following her eating plan approximately 35% of the time. Maria Mcpherson states she is walking 30 minutes 4 times per week.  Today's visit was #: 7 Starting weight: 191 lbs Starting date: 08/19/2020 Today's weight: 172 lbs Today's date: 01/05/2021 Total lbs lost to date: 19 Total lbs lost since last in-office visit: 0  Interim History: Maria Mcpherson's weight remains the same. She was sick (had COVID) and feels better.  Subjective:   1. Hyperlipidemia, unspecified hyperlipidemia type Maria Mcpherson is taking fish oil and Zocor.  Lab Results  Component Value Date   ALT 25 08/03/2020   AST 25 08/03/2020   ALKPHOS 73 03/23/2017   BILITOT 0.5 08/03/2020   Lab Results  Component Value Date   CHOL 227 (H) 08/03/2020   HDL 65 08/03/2020   LDLCALC 129 (H) 08/03/2020   TRIG 193 (H) 08/03/2020   CHOLHDL 3.5 08/03/2020   2. Pre-diabetes Maria Mcpherson is taking Glucophage.  Lab Results  Component Value Date   HGBA1C 6.2 (H) 08/03/2020   Lab Results  Component Value Date   INSULIN 17.1 08/19/2020   3. Low ferritin Maria Mcpherson is not a vegetarian.  She does not have a history of weight loss surgery.   CBC Latest Ref Rng & Units 08/03/2020 05/13/2019 03/27/2018  WBC 3.8 - 10.8 Thousand/uL 4.2 3.5(L) 4.6  Hemoglobin 11.7 - 15.5 g/dL 10.8(L) 13.7 14.2  Hematocrit 35.0 - 45.0 % 34.5(L) 40.4 42.0  Platelets 140 - 400 Thousand/uL 286 233 232   Lab Results  Component Value Date   IRON 49 08/03/2020   TIBC 414 08/03/2020   FERRITIN 9 (L) 08/03/2020   Lab Results  Component Value Date   VITAMINB12 497 08/03/2020   4. Vitamin D deficiency Maria Mcpherson is taking a multivitamin.  5. At risk for activity intolerance Maria Mcpherson is at risk for exercise intolerance due to obesity and decreased ferritin  level.   Assessment/Plan:   1. Hyperlipidemia, unspecified hyperlipidemia type Cardiovascular risk and specific lipid/LDL goals reviewed.  We discussed several lifestyle modifications today and Maria Mcpherson will continue to work on diet, exercise and weight loss efforts. Orders and follow up as documented in patient record.   Counseling Intensive lifestyle modifications are the first line treatment for this issue. Dietary changes: Increase soluble fiber. Decrease simple carbohydrates. Exercise changes: Moderate to vigorous-intensity aerobic activity 150 minutes per week if tolerated. Lipid-lowering medications: see documented in medical record.  - Lipid Panel With LDL/HDL Ratio  2. Pre-diabetes Maria Mcpherson will continue to work on weight loss, exercise, and decreasing simple carbohydrates to help decrease the risk of diabetes. Continue medication.   - Hemoglobin A1c - Insulin, random  3. Low ferritin Check labs today.  - Anemia panel - CBC with Differential/Platelet  4. Vitamin D deficiency Low Vitamin D level contributes to fatigue and are associated with obesity, breast, and colon cancer. She agrees to continue multivitamin follow-up for routine testing of Vitamin D, at least 2-3 times per year to avoid over-replacement.  - VITAMIN D 25 Hydroxy (Vit-D Deficiency, Fractures)  5. At risk for activity intolerance Maria Mcpherson was given approximately 15 minutes of exercise intolerance counseling today. She is 65 y.o. female and has risk factors exercise intolerance including obesity. We discussed intensive lifestyle modifications today with an emphasis on specific weight loss instructions and  strategies. Maria Mcpherson will slowly increase activity as tolerated.  Repetitive spaced learning was employed today to elicit superior memory formation and behavioral change.   6. Obesity with current BMI of 27  Maria Mcpherson is currently in the action stage of change. As such, her goal is to continue with weight  loss efforts. She has agreed to the Category 1 Plan.   Meal plan Intentional eating Mindful eating Have fruit available (less snack)  Exercise goals:  As is- add in small weights and 20 minutes at least.  Behavioral modification strategies: increasing lean protein intake, decreasing simple carbohydrates, increasing vegetables, increasing water intake, decreasing eating out, no skipping meals, meal planning and cooking strategies, keeping healthy foods in the home, and planning for success.  Maria Mcpherson has agreed to follow-up with our clinic in 2-3 weeks. She was informed of the importance of frequent follow-up visits to maximize her success with intensive lifestyle modifications for her multiple health conditions.   Maria Mcpherson was informed we would discuss her lab results at her next visit unless there is a critical issue that needs to be addressed sooner. Maria Mcpherson agreed to keep her next visit at the agreed upon time to discuss these results.  Objective:   Blood pressure 132/85, pulse (!) 57, temperature 98.1 F (36.7 C), height 5\' 6"  (1.676 m), weight 172 lb (78 kg), last menstrual period 12/04/2013, SpO2 96 %. Body mass index is 27.76 kg/m.  General: Cooperative, alert, well developed, in no acute distress. HEENT: Conjunctivae and lids unremarkable. Cardiovascular: Regular rhythm.  Lungs: Normal work of breathing. Neurologic: No focal deficits.   Lab Results  Component Value Date   CREATININE 0.89 08/03/2020   BUN 17 08/03/2020   NA 143 08/03/2020   K 4.4 08/03/2020   CL 101 08/03/2020   CO2 35 (H) 08/03/2020   Lab Results  Component Value Date   ALT 25 08/03/2020   AST 25 08/03/2020   ALKPHOS 73 03/23/2017   BILITOT 0.5 08/03/2020   Lab Results  Component Value Date   HGBA1C 6.2 (H) 08/03/2020   HGBA1C 5.7 (H) 11/11/2019   HGBA1C 5.6 05/13/2019   HGBA1C 6.0 (H) 09/25/2018   HGBA1C 5.9 (H) 03/27/2018   Lab Results  Component Value Date   INSULIN 17.1 08/19/2020    Lab Results  Component Value Date   TSH 1.41 08/03/2020   Lab Results  Component Value Date   CHOL 227 (H) 08/03/2020   HDL 65 08/03/2020   LDLCALC 129 (H) 08/03/2020   TRIG 193 (H) 08/03/2020   CHOLHDL 3.5 08/03/2020   Lab Results  Component Value Date   WBC 4.2 08/03/2020   HGB 10.8 (L) 08/03/2020   HCT 34.5 (L) 08/03/2020   MCV 83.5 08/03/2020   PLT 286 08/03/2020   Lab Results  Component Value Date   IRON 49 08/03/2020   TIBC 414 08/03/2020   FERRITIN 9 (L) 08/03/2020    Attestation Statements:   Reviewed by clinician on day of visit: allergies, medications, problem list, medical history, surgical history, family history, social history, and previous encounter notes.  Time spent on visit including pre-visit chart review and post-visit care and charting was 20 minutes.   Coral Ceo, CMA, am acting as Location manager for [employer], DO.  I have reviewed the above documentation for accuracy and completeness, and I agree with the above. Jearld Lesch, DO

## 2021-01-11 ENCOUNTER — Telehealth: Payer: Self-pay | Admitting: Internal Medicine

## 2021-01-11 NOTE — Telephone Encounter (Signed)
Yes not fasting, needs TSH and CMP.  Left voicemail. FYI

## 2021-01-11 NOTE — Telephone Encounter (Signed)
Maria Mcpherson had labs done at [facility] last week would you check and see if we still need labs?

## 2021-01-13 ENCOUNTER — Other Ambulatory Visit: Payer: Self-pay | Admitting: Internal Medicine

## 2021-01-14 ENCOUNTER — Encounter (INDEPENDENT_AMBULATORY_CARE_PROVIDER_SITE_OTHER): Payer: Self-pay | Admitting: Bariatrics

## 2021-01-19 ENCOUNTER — Other Ambulatory Visit: Payer: BC Managed Care – PPO | Admitting: Internal Medicine

## 2021-01-19 ENCOUNTER — Other Ambulatory Visit: Payer: Self-pay

## 2021-01-19 DIAGNOSIS — I10 Essential (primary) hypertension: Secondary | ICD-10-CM

## 2021-01-19 DIAGNOSIS — D649 Anemia, unspecified: Secondary | ICD-10-CM

## 2021-01-19 DIAGNOSIS — Z0000 Encounter for general adult medical examination without abnormal findings: Secondary | ICD-10-CM

## 2021-01-19 DIAGNOSIS — R7303 Prediabetes: Secondary | ICD-10-CM | POA: Diagnosis not present

## 2021-01-19 DIAGNOSIS — E785 Hyperlipidemia, unspecified: Secondary | ICD-10-CM

## 2021-01-19 DIAGNOSIS — Z9189 Other specified personal risk factors, not elsewhere classified: Secondary | ICD-10-CM

## 2021-01-19 DIAGNOSIS — E559 Vitamin D deficiency, unspecified: Secondary | ICD-10-CM

## 2021-01-19 DIAGNOSIS — E669 Obesity, unspecified: Secondary | ICD-10-CM

## 2021-01-19 DIAGNOSIS — Z6830 Body mass index (BMI) 30.0-30.9, adult: Secondary | ICD-10-CM

## 2021-01-20 ENCOUNTER — Encounter (INDEPENDENT_AMBULATORY_CARE_PROVIDER_SITE_OTHER): Payer: Self-pay | Admitting: Family Medicine

## 2021-01-20 ENCOUNTER — Ambulatory Visit (INDEPENDENT_AMBULATORY_CARE_PROVIDER_SITE_OTHER): Payer: BC Managed Care – PPO | Admitting: Family Medicine

## 2021-01-20 VITALS — BP 126/8 | HR 66 | Temp 98.1°F | Ht 66.0 in | Wt 176.0 lb

## 2021-01-20 DIAGNOSIS — Z6830 Body mass index (BMI) 30.0-30.9, adult: Secondary | ICD-10-CM

## 2021-01-20 DIAGNOSIS — I10 Essential (primary) hypertension: Secondary | ICD-10-CM

## 2021-01-20 DIAGNOSIS — E669 Obesity, unspecified: Secondary | ICD-10-CM

## 2021-01-20 DIAGNOSIS — R7303 Prediabetes: Secondary | ICD-10-CM

## 2021-01-20 LAB — COMPLETE METABOLIC PANEL WITH GFR
AG Ratio: 1.5 (calc) (ref 1.0–2.5)
ALT: 19 U/L (ref 6–29)
AST: 25 U/L (ref 10–35)
Albumin: 4.3 g/dL (ref 3.6–5.1)
Alkaline phosphatase (APISO): 70 U/L (ref 37–153)
BUN: 22 mg/dL (ref 7–25)
CO2: 29 mmol/L (ref 20–32)
Calcium: 10.1 mg/dL (ref 8.6–10.4)
Chloride: 101 mmol/L (ref 98–110)
Creat: 0.8 mg/dL (ref 0.50–0.99)
GFR, Est African American: 90 mL/min/{1.73_m2} (ref >=60)
GFR, Est Non African American: 78 mL/min/{1.73_m2} (ref >=60)
Globulin: 2.9 g/dL (calc) (ref 1.9–3.7)
Glucose, Bld: 85 mg/dL (ref 65–139)
Potassium: 3.9 mmol/L (ref 3.5–5.3)
Sodium: 143 mmol/L (ref 135–146)
Total Bilirubin: 0.6 mg/dL (ref 0.2–1.2)
Total Protein: 7.2 g/dL (ref 6.1–8.1)

## 2021-01-20 LAB — TSH: TSH: 1.58 mIU/L (ref 0.40–4.50)

## 2021-01-27 NOTE — Progress Notes (Signed)
Chief Complaint:   OBESITY Oktober is here to discuss her progress with her obesity treatment plan along with follow-up of her obesity related diagnoses. Bianey is on the Category 1 Plan and states she is following her eating plan approximately 20% of the time. Aquarius states she is not currently exercising.  Today's visit was #: 8 Starting weight: 191 lbs Starting date: 08/19/2020 Today's weight: 176 lbs Today's date: 01/20/2021 Total lbs lost to date: 15 Total lbs lost since last in-office visit: 0  Interim History: Melenda felt tempted to cancel her appt today. She really struggled with adhering to meal plan. Her 38 year old grandson is visiting and pt has been having more indulgent food in the house. She also reports that she has stopped thinking about what is on plan. [place].  Subjective:   1. Pre-diabetes Leah's last A1c was 6.1 and insulin level was 11.7. She is on 1000 mg Metformin.  2. Primary hypertension BP controlled today. She is on Cozaar, lopressor, and amlodipine. Pt denies chest pain/chest pressure/headache.   Assessment/Plan:   1. Pre-diabetes Otha will continue to work on weight loss, exercise, and decreasing simple carbohydrates to help decrease the risk of diabetes. Continue Metformin. No refill needed today.  2. Primary hypertension Gessica is working on healthy weight loss and exercise to improve blood pressure control. We will watch for signs of hypotension as she continues her lifestyle modifications. Continue current treatment plan.  3. Obesity with current BMI of 27  Bettymae is currently in the action stage of change. As such, her goal is to continue with weight loss efforts. She has agreed to the Category 1 Plan and keeping a food journal and adhering to recommended goals of 1000-1100 calories and 75+ g protein.   Exercise goals: All adults should avoid inactivity. Some physical activity is better than  none, and adults who participate in any amount of physical activity gain some health benefits.  Behavioral modification strategies: increasing lean protein intake, meal planning and cooking strategies, keeping healthy foods in the home, and planning for success.  Vivianna has agreed to follow-up with our clinic in 3 weeks. She was informed of the importance of frequent follow-up visits to maximize her success with intensive lifestyle modifications for her multiple health conditions.   Objective:   Blood pressure (!) 126/8, pulse 66, temperature 98.1 F (36.7 C), height 5\' 6"  (1.676 m), weight 176 lb (79.8 kg), last menstrual period 12/04/2013, SpO2 98 %. Body mass index is 28.41 kg/m.  General: Cooperative, alert, well developed, in no acute distress. HEENT: Conjunctivae and lids unremarkable. Cardiovascular: Regular rhythm.  Lungs: Normal work of breathing. Neurologic: No focal deficits.   Lab Results  Component Value Date   CREATININE 0.80 01/19/2021   BUN 22 01/19/2021   NA 143 01/19/2021   K 3.9 01/19/2021   CL 101 01/19/2021   CO2 29 01/19/2021   Lab Results  Component Value Date   ALT 19 01/19/2021   AST 25 01/19/2021   ALKPHOS 73 03/23/2017   BILITOT 0.6 01/19/2021   Lab Results  Component Value Date   HGBA1C 6.1 (H) 01/05/2021   HGBA1C 6.2 (H) 08/03/2020   HGBA1C 5.7 (H) 11/11/2019   HGBA1C 5.6 05/13/2019   HGBA1C 6.0 (H) 09/25/2018   Lab Results  Component Value Date   INSULIN 11.7 01/05/2021   INSULIN 17.1 08/19/2020   Lab Results  Component Value Date  TSH 1.58 01/19/2021   Lab Results  Component Value Date   CHOL 203 (H) 01/05/2021   HDL 68 01/05/2021   LDLCALC 115 (H) 01/05/2021   TRIG 113 01/05/2021   CHOLHDL 3.5 08/03/2020   Lab Results  Component Value Date   VD25OH 37.4 01/05/2021   VD25OH 40.2 08/19/2020   VD25OH 32 05/13/2019   Lab Results  Component Value Date   WBC 4.0 01/05/2021   HGB 12.7 01/05/2021   HCT 39.8 01/05/2021    MCV 85 01/05/2021   PLT 239 01/05/2021   Lab Results  Component Value Date   IRON 142 (H) 01/05/2021   TIBC 367 01/05/2021   FERRITIN 22 01/05/2021    Attestation Statements:   Reviewed by clinician on day of visit: allergies, medications, problem list, medical history, surgical history, family history, social history, and previous encounter notes.  Time spent on visit including pre-visit chart review and post-visit care and charting was 13 minutes.   Coral Ceo, CMA, am acting as transcriptionist for Coralie Common, MD.   I have reviewed the above documentation for accuracy and completeness, and I agree with the above. - Jinny Blossom, MD

## 2021-02-08 ENCOUNTER — Encounter: Payer: Self-pay | Admitting: Internal Medicine

## 2021-02-08 ENCOUNTER — Ambulatory Visit (INDEPENDENT_AMBULATORY_CARE_PROVIDER_SITE_OTHER): Payer: BC Managed Care – PPO | Admitting: Internal Medicine

## 2021-02-08 ENCOUNTER — Other Ambulatory Visit: Payer: Self-pay

## 2021-02-08 VITALS — BP 120/80 | HR 60 | Ht 66.0 in | Wt 178.0 lb

## 2021-02-08 DIAGNOSIS — E782 Mixed hyperlipidemia: Secondary | ICD-10-CM

## 2021-02-08 DIAGNOSIS — Z0000 Encounter for general adult medical examination without abnormal findings: Secondary | ICD-10-CM

## 2021-02-08 DIAGNOSIS — Z8616 Personal history of COVID-19: Secondary | ICD-10-CM | POA: Diagnosis not present

## 2021-02-08 DIAGNOSIS — E1169 Type 2 diabetes mellitus with other specified complication: Secondary | ICD-10-CM

## 2021-02-08 DIAGNOSIS — I152 Hypertension secondary to endocrine disorders: Secondary | ICD-10-CM

## 2021-02-08 DIAGNOSIS — G4733 Obstructive sleep apnea (adult) (pediatric): Secondary | ICD-10-CM

## 2021-02-08 DIAGNOSIS — E1159 Type 2 diabetes mellitus with other circulatory complications: Secondary | ICD-10-CM

## 2021-02-08 MED ORDER — AMLODIPINE BESYLATE 2.5 MG PO TABS: 2.5000 mg | ORAL_TABLET | Freq: Every day | ORAL | 0 refills | Status: DC

## 2021-02-08 NOTE — Progress Notes (Signed)
   Subjective:    Patient ID: Maria Mcpherson, female    DOB: 1955-08-15, 65 y.o.   MRN: [id number]  HPI 65 year old Female seen for 6 month follow up.  On Simvastatin 40 mg daily and LDL is slightly elevated at 116. Continue diet and exercise efforts.   Seen at  [facility]. Amlodipine has been reduced to 2.5 mg daily.  Review of Systems Had Covid in May and took about a month to recover-had cough that persisted but better now  Old paper chart shows she had colonoscopy 2011 at [facility] by Dr. Alain Marion. Had hyperplastic  polyp with 10 year follow up recommended. She would like to have it done here in [place] soon. Will make referral.     Objective:   Physical Exam Has lost from 197 pounds in January. BMI 28.73.  Current weight 178 pounds Skin warm and dry.  Nodes none.  Neck is supple without JVD thyromegaly or carotid bruits.  Chest is clear to auscultation.  Cardiac exam regular rate and rhythm.  No lower extremity pitting edema.  Blood pressure is excellent at 120/80      Assessment & Plan:   BMI 28.73- making progress through [facility]-has lost 19 pounds since January  Glucose intolerance- continue metformin- reduced dose currently 500 mg daily not 1000 mg daily  HTN- [facility] reduced her amlodipine to 2.5 mg daily and BP is stable.  New prescription for lower dose of amlodipine sent in today.  Pure hypercholesterolemia.  Total cholesterol slightly elevated 203 and LDL is 115.  Intermittently has had elevated triglycerides but currently things are normal.  Continue simvastatin  Health maintenance - referral to [place] for colonoscopy- had initial study in 2011 at [facility] and had hyperplastic polyp- report to be scanned into Epic under Media  Recommend Covid booster late Summer- had Covid 19 in May.  Also, there may be new vaccine this Fall so could wait for that if not too late in the season.  Had Tdap Jan 2022. Hold off on Shingrix for now.

## 2021-02-08 NOTE — Patient Instructions (Addendum)
It was a pleasure to see you today.  Recommend COVID-vaccine later this Summer or early Fall.  There may be a new vaccine covering more variants late Summer /early Fall.  I am pleased with your weight loss efforts.  Continue current medications.  Follow-up in 6 months which will be a physical exam appointment.  Referral will be made regarding colonoscopy.  Continue metformin and simvastatin.  Recommend COVID booster late summer early Fall.  Continue antihypertensive medications.  I note that metformin has been reviewed by healthy weight clinic.

## 2021-02-17 ENCOUNTER — Ambulatory Visit (INDEPENDENT_AMBULATORY_CARE_PROVIDER_SITE_OTHER): Payer: BC Managed Care – PPO | Admitting: Family Medicine

## 2021-03-02 ENCOUNTER — Encounter (INDEPENDENT_AMBULATORY_CARE_PROVIDER_SITE_OTHER): Payer: Self-pay | Admitting: Family Medicine

## 2021-03-02 ENCOUNTER — Ambulatory Visit (INDEPENDENT_AMBULATORY_CARE_PROVIDER_SITE_OTHER): Payer: BC Managed Care – PPO | Admitting: Family Medicine

## 2021-03-02 ENCOUNTER — Other Ambulatory Visit: Payer: Self-pay

## 2021-03-02 VITALS — BP 127/79 | HR 63 | Temp 98.4°F | Ht 66.0 in | Wt 174.0 lb

## 2021-03-02 DIAGNOSIS — E669 Obesity, unspecified: Secondary | ICD-10-CM

## 2021-03-02 DIAGNOSIS — Z6830 Body mass index (BMI) 30.0-30.9, adult: Secondary | ICD-10-CM

## 2021-03-02 DIAGNOSIS — I10 Essential (primary) hypertension: Secondary | ICD-10-CM

## 2021-03-02 DIAGNOSIS — R7303 Prediabetes: Secondary | ICD-10-CM | POA: Diagnosis not present

## 2021-03-03 NOTE — Progress Notes (Signed)
Chief Complaint:   OBESITY Maria Mcpherson is here to discuss her progress with her obesity treatment plan along with follow-up of her obesity related diagnoses. Maria Mcpherson is on the Category 1 Plan or keeping a food journal and adhering to recommended goals of 1000-1100 calories and 75+ grams of protein daily and states she is following her eating plan approximately 30% of the time. Maria Mcpherson states she is walking for 30 minutes 5 times per week.  Today's visit was #: 9 Starting weight: 191 lbs Starting date: 08/19/2020 Today's weight: 174 lbs Today's date: 03/02/2021 Total lbs lost to date: 17 Total lbs lost since last in-office visit: 2  Interim History: Maria Mcpherson was on vacation since her last visit, and she was busy with life. Her attention has been elsewhere. She downloaded MyFitness Pal today  to start tracking her food. Her biggest problem is she is getting hungry around 10-11. She does have 3 days at the beach in the next few weeks.  Subjective:   1. Pre-diabetes Maria Mcpherson is on metformin daily, and she denies GI side effects.  2. Essential hypertension Maria Mcpherson's blood pressure is well controlled today. She denies chest pain, chest pressure, or headache.  Assessment/Plan:   1. Pre-diabetes Maria Mcpherson will continue metformin, no refill needed. She will continue to work on weight loss, exercise, and decreasing simple carbohydrates to help decrease the risk of diabetes.   2. Essential hypertension Maria Mcpherson will continue her current medications, no change needed in dosage. She will continue working on healthy weight loss and exercise to improve blood pressure control. We will watch for signs of hypotension as she continues her lifestyle modifications.  3. Obesity with current BMI of 28.2 Maria Mcpherson is currently in the action stage of change. As such, her goal is to continue with weight loss efforts. She has agreed to the Category 1 Plan or keeping a food journal and adhering to recommended goals of  1000-1100 calories and 75+ grams of protein daily.   Exercise goals: As is, and add resistance training for 15 minutes 3 times per week.  Behavioral modification strategies: increasing lean protein intake, meal planning and cooking strategies, keeping healthy foods in the home, and planning for success.  Maria Mcpherson has agreed to follow-up with our clinic in 3 weeks. She was informed of the importance of frequent follow-up visits to maximize her success with intensive lifestyle modifications for her multiple health conditions.   Objective:   Blood pressure 127/79, pulse 63, temperature 98.4 F (36.9 C), height '5\' 6"'$  (1.676 m), weight 174 lb (78.9 kg), last menstrual period 12/04/2013, SpO2 99 %. Body mass index is 28.08 kg/m.  General: Cooperative, alert, well developed, in no acute distress. HEENT: Conjunctivae and lids unremarkable. Cardiovascular: Regular rhythm.  Lungs: Normal work of breathing. Neurologic: No focal deficits.   Lab Results  Component Value Date   CREATININE 0.80 01/19/2021   BUN 22 01/19/2021   NA 143 01/19/2021   K 3.9 01/19/2021   CL 101 01/19/2021   CO2 29 01/19/2021   Lab Results  Component Value Date   ALT 19 01/19/2021   AST 25 01/19/2021   ALKPHOS 73 03/23/2017   BILITOT 0.6 01/19/2021   Lab Results  Component Value Date   HGBA1C 6.1 (H) 01/05/2021   HGBA1C 6.2 (H) 08/03/2020   HGBA1C 5.7 (H) 11/11/2019   HGBA1C 5.6 05/13/2019   HGBA1C 6.0 (H) 09/25/2018   Lab Results  Component Value Date   INSULIN 11.7 01/05/2021   INSULIN 17.1 08/19/2020  Lab Results  Component Value Date   TSH 1.58 01/19/2021   Lab Results  Component Value Date   CHOL 203 (H) 01/05/2021   HDL 68 01/05/2021   LDLCALC 115 (H) 01/05/2021   TRIG 113 01/05/2021   CHOLHDL 3.5 08/03/2020   Lab Results  Component Value Date   VD25OH 37.4 01/05/2021   VD25OH 40.2 08/19/2020   VD25OH 32 05/13/2019   Lab Results  Component Value Date   WBC 4.0 01/05/2021   HGB  12.7 01/05/2021   HCT 39.8 01/05/2021   MCV 85 01/05/2021   PLT 239 01/05/2021   Lab Results  Component Value Date   IRON 142 (H) 01/05/2021   TIBC 367 01/05/2021   FERRITIN 22 01/05/2021   Attestation Statements:   Reviewed by clinician on day of visit: allergies, medications, problem list, medical history, surgical history, family history, social history, and previous encounter notes.  Time spent on visit including pre-visit chart review and post-visit care and charting was 12 minutes.    I, Trixie Dredge, am acting as transcriptionist for Coralie Common, MD.  I have reviewed the above documentation for accuracy and completeness, and I agree with the above. - Coralie Common, MD

## 2021-03-10 ENCOUNTER — Other Ambulatory Visit: Payer: Self-pay | Admitting: Internal Medicine

## 2021-03-24 ENCOUNTER — Ambulatory Visit (INDEPENDENT_AMBULATORY_CARE_PROVIDER_SITE_OTHER): Payer: BC Managed Care – PPO | Admitting: Family Medicine

## 2021-03-24 ENCOUNTER — Encounter (INDEPENDENT_AMBULATORY_CARE_PROVIDER_SITE_OTHER): Payer: Self-pay | Admitting: Family Medicine

## 2021-03-24 ENCOUNTER — Other Ambulatory Visit: Payer: Self-pay

## 2021-03-24 VITALS — BP 128/81 | HR 64 | Temp 98.3°F | Ht 66.0 in | Wt 175.0 lb

## 2021-03-24 DIAGNOSIS — Z6830 Body mass index (BMI) 30.0-30.9, adult: Secondary | ICD-10-CM

## 2021-03-24 DIAGNOSIS — E669 Obesity, unspecified: Secondary | ICD-10-CM | POA: Diagnosis not present

## 2021-03-24 DIAGNOSIS — I10 Essential (primary) hypertension: Secondary | ICD-10-CM | POA: Diagnosis not present

## 2021-03-25 NOTE — Progress Notes (Signed)
Chief Complaint:   OBESITY Maria Mcpherson is here to discuss her progress with her obesity treatment plan along with follow-up of her obesity related diagnoses. Maria Mcpherson is on keeping a food journal and adhering to recommended goals of 1000-1100 calories and 75+ grams protein and states she is following her eating plan approximately 50% of the time. Maria Mcpherson states she is walking and strength training 30-60 minutes 4 times per week.  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 She has done decently on journaling- writing it down. She is going over calorie goal and not getting all protein in. [place]  Subjective:   1. Essential hypertension BP well controlled today. Pt denies chest pain/chest pressure/headache. She is on amlodipine and Norvasc.  Assessment/Plan:   1. Essential hypertension Maria Mcpherson is working on healthy weight loss and exercise to improve blood pressure control. We will watch for signs of hypotension as she continues her lifestyle modifications. Continue current treatment plan.  2. Obesity with current BMI of 28.3  Maria Mcpherson is currently in the action stage of change. As such, her goal is to continue with weight loss efforts. She has agreed to keeping a food journal and adhering to recommended goals of 1000-1100 calories and 75+ grams protein.   Exercise goals: All adults should avoid inactivity. Some physical activity is better than none, and adults who participate in any amount of physical activity gain some health benefits.  Behavioral modification strategies: increasing lean protein intake, meal planning and cooking strategies, keeping healthy foods in the home,  planning for success, and keeping a strict food journal.  Maria Mcpherson has agreed to follow-up with our clinic in 3-4 weeks. She was informed of the importance of frequent follow-up visits to maximize her success with intensive lifestyle modifications for her multiple health conditions.   Objective:   Blood pressure 128/81, pulse 64, temperature 98.3 F (36.8 C), height '5\' 6"'$  (1.676 m), weight 175 lb (79.4 kg), last menstrual period 12/04/2013, SpO2 95 %. Body mass index is 28.25 kg/m.  General: Cooperative, alert, well developed, in no acute distress. HEENT: Conjunctivae and lids unremarkable. Cardiovascular: Regular rhythm.  Lungs: Normal work of breathing. Neurologic: No focal deficits.   Lab Results  Component Value Date   CREATININE 0.80 01/19/2021   BUN 22 01/19/2021   NA 143 01/19/2021   K 3.9 01/19/2021   CL 101 01/19/2021   CO2 29 01/19/2021   Lab Results  Component Value Date   ALT 19 01/19/2021   AST 25 01/19/2021   ALKPHOS 73 03/23/2017   BILITOT 0.6 01/19/2021   Lab Results  Component Value Date   HGBA1C 6.1 (H) 01/05/2021   HGBA1C 6.2 (H) 08/03/2020   HGBA1C 5.7 (H) 11/11/2019   HGBA1C 5.6 05/13/2019   HGBA1C 6.0 (H) 09/25/2018   Lab Results  Component Value Date   INSULIN 11.7 01/05/2021   INSULIN 17.1 08/19/2020   Lab Results  Component Value Date   TSH 1.58 01/19/2021   Lab Results  Component Value Date   CHOL 203 (H) 01/05/2021   HDL 68 01/05/2021   LDLCALC 115 (H) 01/05/2021   TRIG 113 01/05/2021   CHOLHDL 3.5 08/03/2020  Lab Results  Component Value Date   VD25OH 37.4 01/05/2021   VD25OH 40.2 08/19/2020   VD25OH 32 05/13/2019   Lab Results  Component Value Date   WBC 4.0 01/05/2021   HGB 12.7 01/05/2021   HCT 39.8 01/05/2021   MCV 85 01/05/2021   PLT 239 01/05/2021   Lab Results  Component Value Date   IRON 142 (H) 01/05/2021   TIBC 367 01/05/2021   FERRITIN 22 01/05/2021   Attestation Statements:   Reviewed by clinician  on day of visit: allergies, medications, problem list, medical history, surgical history, family history, social history, and previous encounter notes.  Coral Ceo, CMA, am acting as transcriptionist for Coralie Common, MD.   I have reviewed the above documentation for accuracy and completeness, and I agree with the above. - Coralie Common, MD

## 2021-04-05 DIAGNOSIS — G4733 Obstructive sleep apnea (adult) (pediatric): Secondary | ICD-10-CM | POA: Diagnosis not present

## 2021-04-14 ENCOUNTER — Ambulatory Visit (INDEPENDENT_AMBULATORY_CARE_PROVIDER_SITE_OTHER): Payer: BC Managed Care – PPO | Admitting: Family Medicine

## 2021-04-20 ENCOUNTER — Ambulatory Visit (INDEPENDENT_AMBULATORY_CARE_PROVIDER_SITE_OTHER): Payer: BC Managed Care – PPO | Admitting: Family Medicine

## 2021-04-20 ENCOUNTER — Encounter (INDEPENDENT_AMBULATORY_CARE_PROVIDER_SITE_OTHER): Payer: Self-pay | Admitting: Family Medicine

## 2021-04-20 ENCOUNTER — Other Ambulatory Visit: Payer: Self-pay

## 2021-04-20 VITALS — BP 124/76 | HR 66 | Temp 98.1°F | Ht 66.0 in | Wt 177.0 lb

## 2021-04-20 DIAGNOSIS — E669 Obesity, unspecified: Secondary | ICD-10-CM | POA: Diagnosis not present

## 2021-04-20 DIAGNOSIS — Z6830 Body mass index (BMI) 30.0-30.9, adult: Secondary | ICD-10-CM | POA: Diagnosis not present

## 2021-04-20 DIAGNOSIS — I10 Essential (primary) hypertension: Secondary | ICD-10-CM | POA: Diagnosis not present

## 2021-04-20 DIAGNOSIS — E7849 Other hyperlipidemia: Secondary | ICD-10-CM

## 2021-04-20 NOTE — Progress Notes (Signed)
Chief Complaint:   OBESITY Maria Mcpherson is here to discuss her progress with her obesity treatment plan along with follow-up of her obesity related diagnoses. Maria Mcpherson is on keeping a food journal and adhering to recommended goals of 1000-1100 calories and 75+ grams protein and states she is following her eating plan approximately 25% of the time. Maria Mcpherson states she is walking and strength training 60 minutes 2-3 times per week.  Today's visit was #: 11 Starting weight: 191 lbs Starting date: 08/19/2020 Today's weight: 177 lbs Today's date: 04/20/2021 Total lbs lost to date: 14 Total lbs lost since last in-office visit: 0  Interim History: Maria Mcpherson has been struggling over the last few weeks. She voices that she likes sweets too much and has quite a few sweets around, like brownies and cupcakes. She voices that she indulges in candy jar at work. Mindfulness comes after she eats indulgently. Pt has a week long conference coming up.  Subjective:   1. Essential hypertension BP controlled today. Maria Mcpherson denies chest pain/chest pressure/headache.  2. Other hyperlipidemia Pt's last LDL was 115. She denies myalgias or transaminitis.  Assessment/Plan:   1. Essential hypertension Maria Mcpherson is working on healthy weight loss and exercise to improve blood pressure control. We will watch for signs of hypotension as she continues her lifestyle modifications. Continue current treatment plan.  2. Other hyperlipidemia Cardiovascular risk and specific lipid/LDL goals reviewed.  We discussed several lifestyle modifications today and Maria Mcpherson will continue to work on diet, exercise and weight loss efforts. Orders and follow up as documented in patient record. Continue Zocor with no change in dose.  Counseling Intensive lifestyle modifications are the first line treatment for this issue. Dietary changes: Increase soluble fiber. Decrease simple carbohydrates. Exercise changes: Moderate to vigorous-intensity  aerobic activity 150 minutes per week if tolerated. Lipid-lowering medications: see documented in medical record.  3. Obesity with current BMI of 28.7  Maria Mcpherson is currently in the action stage of change. As such, her goal is to continue with weight loss efforts. She has agreed to keeping a food journal and adhering to recommended goals of 1000-1100 calories and 75+ grams protein.   Exercise goals:  As is  Behavioral modification strategies: increasing lean protein intake, meal planning and cooking strategies, keeping healthy foods in the home, and avoiding temptations.  Maria Mcpherson has agreed to follow-up with our clinic in 3-4 weeks. She was informed of the importance of frequent follow-up visits to maximize her success with intensive lifestyle modifications for her multiple health conditions.   Objective:   Blood pressure 124/76, pulse 66, temperature 98.1 F (36.7 C), height 5\' 6"  (1.676 m), weight 177 lb (80.3 kg), last menstrual period 12/04/2013, SpO2 99 %. Body mass index is 28.57 kg/m.  General: Cooperative, alert, well developed, in no acute distress. HEENT: Conjunctivae and lids unremarkable. Cardiovascular: Regular rhythm.  Lungs: Normal work of breathing. Neurologic: No focal deficits.   Lab Results  Component Value Date   CREATININE 0.80 01/19/2021   BUN 22 01/19/2021   NA 143 01/19/2021   K 3.9 01/19/2021   CL 101 01/19/2021   CO2 29 01/19/2021   Lab Results  Component Value Date   ALT 19 01/19/2021   AST 25 01/19/2021   ALKPHOS 73 03/23/2017   BILITOT 0.6 01/19/2021   Lab Results  Component Value Date   HGBA1C 6.1 (H) 01/05/2021   HGBA1C 6.2 (H) 08/03/2020   HGBA1C 5.7 (H) 11/11/2019   HGBA1C 5.6 05/13/2019   HGBA1C 6.0 (H)  09/25/2018   Lab Results  Component Value Date   INSULIN 11.7 01/05/2021   INSULIN 17.1 08/19/2020   Lab Results  Component Value Date   TSH 1.58 01/19/2021   Lab Results  Component Value Date   CHOL 203 (H) 01/05/2021   HDL  68 01/05/2021   LDLCALC 115 (H) 01/05/2021   TRIG 113 01/05/2021   CHOLHDL 3.5 08/03/2020   Lab Results  Component Value Date   VD25OH 37.4 01/05/2021   VD25OH 40.2 08/19/2020   VD25OH 32 05/13/2019   Lab Results  Component Value Date   WBC 4.0 01/05/2021   HGB 12.7 01/05/2021   HCT 39.8 01/05/2021   MCV 85 01/05/2021   PLT 239 01/05/2021   Lab Results  Component Value Date   IRON 142 (H) 01/05/2021   TIBC 367 01/05/2021   FERRITIN 22 01/05/2021    Attestation Statements:   Reviewed by clinician on day of visit: allergies, medications, problem list, medical history, surgical history, family history, social history, and previous encounter notes.  Time spent on visit including pre-visit chart review and post-visit care and charting was 15 minutes.   Coral Ceo, CMA, am acting as transcriptionist for Coralie Common, MD.   I have reviewed the above documentation for accuracy and completeness, and I agree with the above. - Coralie Common, MD

## 2021-05-05 DIAGNOSIS — G4733 Obstructive sleep apnea (adult) (pediatric): Secondary | ICD-10-CM | POA: Diagnosis not present

## 2021-05-16 ENCOUNTER — Other Ambulatory Visit: Payer: Self-pay | Admitting: Internal Medicine

## 2021-05-17 ENCOUNTER — Ambulatory Visit (INDEPENDENT_AMBULATORY_CARE_PROVIDER_SITE_OTHER): Payer: BC Managed Care – PPO | Admitting: Family Medicine

## 2021-05-27 ENCOUNTER — Ambulatory Visit (INDEPENDENT_AMBULATORY_CARE_PROVIDER_SITE_OTHER): Payer: BC Managed Care – PPO | Admitting: Family Medicine

## 2021-05-27 DIAGNOSIS — Z85828 Personal history of other malignant neoplasm of skin: Secondary | ICD-10-CM | POA: Diagnosis not present

## 2021-05-27 DIAGNOSIS — L578 Other skin changes due to chronic exposure to nonionizing radiation: Secondary | ICD-10-CM | POA: Diagnosis not present

## 2021-05-27 DIAGNOSIS — L821 Other seborrheic keratosis: Secondary | ICD-10-CM | POA: Diagnosis not present

## 2021-05-27 DIAGNOSIS — D225 Melanocytic nevi of trunk: Secondary | ICD-10-CM | POA: Diagnosis not present

## 2021-06-01 ENCOUNTER — Other Ambulatory Visit: Payer: Self-pay | Admitting: Internal Medicine

## 2021-06-05 DIAGNOSIS — G4733 Obstructive sleep apnea (adult) (pediatric): Secondary | ICD-10-CM | POA: Diagnosis not present

## 2021-06-10 ENCOUNTER — Encounter: Payer: Self-pay | Admitting: Internal Medicine

## 2021-06-23 ENCOUNTER — Encounter: Payer: Self-pay | Admitting: Internal Medicine

## 2021-06-23 ENCOUNTER — Other Ambulatory Visit: Payer: Self-pay

## 2021-06-23 ENCOUNTER — Ambulatory Visit (AMBULATORY_SURGERY_CENTER): Payer: Self-pay | Admitting: *Deleted

## 2021-06-23 VITALS — Ht 66.0 in | Wt 188.0 lb

## 2021-06-23 DIAGNOSIS — Z1211 Encounter for screening for malignant neoplasm of colon: Secondary | ICD-10-CM

## 2021-06-23 MED ORDER — NA SULFATE-K SULFATE-MG SULF 17.5-3.13-1.6 GM/177ML PO SOLN: 1.0000 | ORAL | 0 refills | Status: DC

## 2021-06-23 NOTE — Progress Notes (Signed)
Patient is here in-person for PV. Patient denies any allergies to eggs or soy. Patient denies any problems with anesthesia/sedation. Patient is not on any oxygen at home. Patient is not taking any diet/weight loss medications or blood thinners. Patient is aware of our care-partner policy and Covid-19 safety protocol.   EMMI education assigned to the patient for the procedure, sent to MyChart.   Patient is COVID-19 vaccinated.  

## 2021-07-02 ENCOUNTER — Ambulatory Visit (AMBULATORY_SURGERY_CENTER): Payer: BC Managed Care – PPO | Admitting: Internal Medicine

## 2021-07-02 ENCOUNTER — Encounter: Payer: Self-pay | Admitting: Internal Medicine

## 2021-07-02 ENCOUNTER — Other Ambulatory Visit: Payer: Self-pay

## 2021-07-02 VITALS — BP 129/78 | HR 59 | Temp 97.1°F | Resp 22 | Ht 66.0 in | Wt 188.0 lb

## 2021-07-02 DIAGNOSIS — D128 Benign neoplasm of rectum: Secondary | ICD-10-CM

## 2021-07-02 DIAGNOSIS — Z1211 Encounter for screening for malignant neoplasm of colon: Secondary | ICD-10-CM

## 2021-07-02 DIAGNOSIS — D123 Benign neoplasm of transverse colon: Secondary | ICD-10-CM

## 2021-07-02 DIAGNOSIS — D120 Benign neoplasm of cecum: Secondary | ICD-10-CM | POA: Diagnosis not present

## 2021-07-02 DIAGNOSIS — D129 Benign neoplasm of anus and anal canal: Secondary | ICD-10-CM

## 2021-07-02 MED ORDER — SODIUM CHLORIDE 0.9 % IV SOLN: 500.0000 mL | Freq: Once | INTRAVENOUS | Status: DC

## 2021-07-02 MED ORDER — FLEET ENEMA 7-19 GM/118ML RE ENEM
1.0000 | ENEMA | Freq: Once | RECTAL | Status: AC
  Administered 2021-07-02: 1 via RECTAL

## 2021-07-02 NOTE — Progress Notes (Signed)
Pt's states no medical or surgical changes since previsit or office visit. VS by CW. 

## 2021-07-02 NOTE — Op Note (Signed)
[facility] Patient Name: Maria Mcpherson Procedure Date: 07/02/2021 9:24 AM MRN: [id number] Endoscopist: Sonny Masters "Maria Mcpherson ,  Age: 65 Referring MD:  Date of Birth: 13-Apr-1956 Gender: Female Account #: [id number] Procedure:                Colonoscopy Indications:              Screening for colorectal malignant neoplasm Medicines:                Monitored Anesthesia Care Procedure:                Pre-Anesthesia Assessment:                           - Prior to the procedure, a History and Physical                            was performed, and patient medications and                            allergies were reviewed. The patient's tolerance of                            previous anesthesia was also reviewed. The risks                            and benefits of the procedure and the sedation                            options and risks were discussed with the patient.                            All questions were answered, and informed consent                            was obtained. Prior Anticoagulants: The patient has                            taken no previous anticoagulant or antiplatelet                            agents. ASA Grade Assessment: III - A patient with                            severe systemic disease. After reviewing the risks                            and benefits, the patient was deemed in                            satisfactory condition to undergo the procedure.                           After obtaining informed consent, the colonoscope  was passed under direct vision. Throughout the                            procedure, the patient's blood pressure, pulse, and                            oxygen saturations were monitored continuously. The                            Olympus CF-[id number]) Colonoscope was                            introduced through the anus and advanced to the the                            terminal  ileum. The colonoscopy was performed                            without difficulty. The patient tolerated the                            procedure well. The quality of the bowel                            preparation was adequate. The terminal ileum,                            ileocecal valve, appendiceal orifice, and rectum                            were photographed. Scope In: 9:30:09 AM Scope Out: 9:50:05 AM Scope Withdrawal Time: 0 hours 14 minutes 1 second  Total Procedure Duration: 0 hours 19 minutes 56 seconds  Findings:                 The terminal ileum appeared normal.                           Multiple small and large-mouthed diverticula were                            found in the entire colon.                           Four sessile polyps were found in the rectum,                            transverse colon and cecum. The polyps were 3 to 6                            mm in size. These polyps were removed with a cold                            snare. Resection and retrieval were complete.  Non-bleeding internal hemorrhoids were found during                            retroflexion. Complications:            No immediate complications. Estimated Blood Loss:     Estimated blood loss was minimal. Impression:               - The examined portion of the ileum was normal.                           - Diverticulosis in the entire examined colon.                           - Four 3 to 6 mm polyps in the rectum, in the                            transverse colon and in the cecum, removed with a                            cold snare. Resected and retrieved.                           - Non-bleeding internal hemorrhoids. Recommendation:           - Return patient to hospital ward for ongoing care.                           - Await pathology results.                           - The findings and recommendations were discussed                            with the  patient. Sonny Masters "Maria Mcpherson,  07/02/2021 9:54:32 AM

## 2021-07-02 NOTE — Progress Notes (Signed)
GASTROENTEROLOGY PROCEDURE H&P NOTE   Primary Care Physician: Elby Showers, MD    Reason for Procedure:   Colon cancer screening  Plan:    Colonoscopy  Patient is appropriate for endoscopic procedure(s) in the ambulatory ([facility]) setting.  The nature of the procedure, as well as the risks, benefits, and alternatives were carefully and thoroughly reviewed with the patient. Ample time for discussion and questions allowed. The patient understood, was satisfied, and agreed to proceed.     HPI: Maria Mcpherson is a 65 y.o. female who presents for colonoscopy for colon cancer screening. Her last colonoscopy was in 2011 when she was found to have a hyperplastic polyp and was recommended for 10 year follow up. Denies blood in stools, changes in bowel habits, weight loss. Denies use of blood thinners. Denies fam hx of colon cancer.  Past Medical History:  Diagnosis Date   Arthritis    Bilateral swelling of legs and ankles    Borderline diabetes    Fibrocystic breast disease    Heart murmur    very slight   History of nonmelanoma skin cancer    HTN (hypertension)    Hx of blood clots    Hyperlipidemia    Hypokalemia    Prediabetes    pulmonary emboli 04/2005   same time as oophrectomy/myomectomy, evlauation was negative   Skin cancer    Sleep apnea    CPAP    Past Surgical History:  Procedure Laterality Date   COLONOSCOPY  11/05/2009   OOPHORECTOMY  04/24/2005   only had one ovary removed unsure of rt or left   TONSILLECTOMY AND ADENOIDECTOMY  07/25/1961   TOTAL HIP ARTHROPLASTY Left 08/28/2015   Procedure: LEFT TOTAL HIP ARTHROPLASTY ANTERIOR APPROACH;  Surgeon: Mcarthur Rossetti, MD;  Location: [facility];  Service: Orthopedics;  Laterality: Left;    Prior to Admission medications   Medication Sig Start Date End Date Taking? Authorizing Provider  amLODipine (NORVASC) 2.5 MG tablet TAKE 1 TABLET BY MOUTH EVERY DAY 05/16/21  Yes Baxley, Cresenciano Lick, MD  calcium carbonate  (OS-CAL) 600 MG TABS Take 600 mg by mouth 2 (two) times daily with a meal.   Yes [provider]  furosemide (LASIX) 40 MG tablet TAKE 1 TABLET DAILY 01/13/21  Yes Elby Showers, MD  losartan (COZAAR) 100 MG tablet TAKE 1 TABLET DAILY 03/10/21  Yes Elby Showers, MD  metFORMIN (GLUCOPHAGE) 1000 MG tablet TAKE 1 TABLET DAILY Patient taking differently: Take 500 mg by mouth daily with breakfast. 01/13/21  Yes Baxley, Cresenciano Lick, MD  metoprolol tartrate (LOPRESSOR) 50 MG tablet TAKE 1 TABLET DAILY 03/10/21  Yes Elby Showers, MD  simvastatin (ZOCOR) 40 MG tablet TAKE 1 TABLET AT BEDTIME 06/01/21  Yes Baxley, Cresenciano Lick, MD  fish oil-omega-3 fatty acids 1000 MG capsule Take 1 g by mouth daily.    [provider]  loratadine (CLARITIN) 10 MG tablet Take 10 mg by mouth daily.    [provider]  Multiple Vitamin (MULTIVITAMIN) capsule Take 1 capsule by mouth daily.    [provider]  potassium chloride SA (K-DUR) 20 MEQ tablet TAKE ONE TABLET BY MOUTH TWICE DAILY 04/13/19   Elby Showers, MD  Propylene Glycol 0.6 % SOLN Apply 1 drop to eye 3 (three) times daily as needed (Dry eyes).    [provider]    Current Outpatient Medications  Medication Sig Dispense Refill   amLODipine (NORVASC) 2.5 MG tablet TAKE 1 TABLET  BY MOUTH EVERY DAY 90 tablet 1   calcium carbonate (OS-CAL) 600 MG TABS Take 600 mg by mouth 2 (two) times daily with a meal.     furosemide (LASIX) 40 MG tablet TAKE 1 TABLET DAILY 90 tablet 3   losartan (COZAAR) 100 MG tablet TAKE 1 TABLET DAILY 90 tablet 3   metFORMIN (GLUCOPHAGE) 1000 MG tablet TAKE 1 TABLET DAILY (Patient taking differently: Take 500 mg by mouth daily with breakfast.) 90 tablet 3   metoprolol tartrate (LOPRESSOR) 50 MG tablet TAKE 1 TABLET DAILY 90 tablet 3   simvastatin (ZOCOR) 40 MG tablet TAKE 1 TABLET AT BEDTIME 90 tablet 3   fish oil-omega-3 fatty acids 1000 MG capsule Take 1 g by mouth daily.     loratadine (CLARITIN) 10 MG  tablet Take 10 mg by mouth daily.     Multiple Vitamin (MULTIVITAMIN) capsule Take 1 capsule by mouth daily.     potassium chloride SA (K-DUR) 20 MEQ tablet TAKE ONE TABLET BY MOUTH TWICE DAILY 180 tablet 3   Propylene Glycol 0.6 % SOLN Apply 1 drop to eye 3 (three) times daily as needed (Dry eyes).     Current Facility-Administered Medications  Medication Dose Route Frequency Provider Last Rate Last Admin   0.9 %  sodium chloride infusion  500 mL Intravenous Once Sharyn Creamer, MD        Allergies as of 07/02/2021 - Review Complete 07/02/2021  Allergen Reaction Noted   Ampicillin Rash 01/10/2011    Family History  Problem Relation Age of Onset   Stomach cancer Mother    Breast cancer Mother 32       mid 92s   Hypertension Mother    Hyperlipidemia Mother    Cancer Mother    Leukemia Father    Stroke Maternal Grandfather    Colon cancer Neg Hx    Esophageal cancer Neg Hx     Social History   Socioeconomic History   Marital status: Married    Spouse name: Not on file   Number of children: Not on file   Years of education: Not on file   Highest education level: Not on file  Occupational History   Occupation: attorney  Tobacco Use   Smoking status: Never   Smokeless tobacco: Never  Vaping Use   Vaping Use: Never used  Substance and Sexual Activity   Alcohol use: Not Currently    Comment: occasional   Drug use: No   Sexual activity: Never  Other Topics Concern   Not on file  Social History Narrative   Not on file   Social Determinants of Health   Financial Resource Strain: Not on file  Food Insecurity: Not on file  Transportation Needs: Not on file  Physical Activity: Not on file  Stress: Not on file  Social Connections: Not on file  Intimate Partner Violence: Not on file    Physical Exam: Vital signs in last 24 hours: BP (!) 141/87   Pulse 62   Temp (!) 97.1 F (36.2 C)   Ht 5\' 6"  (1.676 m)   Wt 188 lb (85.3 kg)   LMP 12/04/2013   SpO2 99%   BMI  30.34 kg/m  GEN: NAD EYE: Sclerae anicteric ENT: MMM CV: Non-tachycardic Pulm: No increased work of breathing GI: Soft, NT/ND NEURO:  Alert & Oriented   [name], MD [facility]  07/02/2021 9:22 AM

## 2021-07-02 NOTE — Patient Instructions (Signed)
Read all of the handouts given to you by your recovery room nurse.  YOU HAD AN ENDOSCOPIC PROCEDURE TODAY AT [facility]:   Refer to the procedure report that was given to you for any specific questions about what was found during the examination.  If the procedure report does not answer your questions, please call your gastroenterologist to clarify.  If you requested that your care partner not be given the details of your procedure findings, then the procedure report has been included in a sealed envelope for you to review at your convenience later.  YOU SHOULD EXPECT: Some feelings of bloating in the abdomen. Passage of more gas than usual.  Walking can help get rid of the air that was put into your GI tract during the procedure and reduce the bloating. If you had a lower endoscopy (such as a colonoscopy or flexible sigmoidoscopy) you may notice spotting of blood in your stool or on the toilet paper. If you underwent a bowel prep for your procedure, you may not have a normal bowel movement for a few days.  Please Note:  You might notice some irritation and congestion in your nose or some drainage.  This is from the oxygen used during your procedure.  There is no need for concern and it should clear up in a day or so.  SYMPTOMS TO REPORT IMMEDIATELY:  Following lower endoscopy (colonoscopy or flexible sigmoidoscopy):  Excessive amounts of blood in the stool  Significant tenderness or worsening of abdominal pains  Swelling of the abdomen that is new, acute  Fever of 100F or higher   For urgent or emergent issues, a gastroenterologist can be reached at any hour by calling [phone]. Do not use MyChart messaging for urgent concerns.    DIET:  We do recommend a small meal at first, but then you may proceed to your regular diet.  Drink plenty of fluids but you should avoid alcoholic beverages for 24 hours. Try to increase the fiber I your diet, and drink plenty of  water.  ACTIVITY:  You should plan to take it easy for the rest of today and you should NOT DRIVE or use heavy machinery until tomorrow (because of the sedation medicines used during the test).    FOLLOW UP: Our staff will call the number listed on your records 48-72 hours following your procedure to check on you and address any questions or concerns that you may have regarding the information given to you following your procedure. If we do not reach you, we will leave a message.  We will attempt to reach you two times.  During this call, we will ask if you have developed any symptoms of COVID 19. If you develop any symptoms (ie: fever, flu-like symptoms, shortness of breath, cough etc.) before then, please call [phone].  If you test positive for Covid 19 in the 2 weeks post procedure, please call and report this information to [place].    If any biopsies were taken you will be contacted by phone or by letter within the next 1-3 weeks.  Please call us at [phone] if you have not heard about the biopsies in 3 weeks.    SIGNATURES/CONFIDENTIALITY: You and/or your care partner have signed paperwork which will be entered into your electronic medical record.  These signatures attest to the fact that that the information above on your After Visit Summary has been reviewed and is understood.  Full responsibility of the confidentiality of this  discharge information lies with you and/or your care-partner.  

## 2021-07-02 NOTE — Progress Notes (Signed)
Sedate, gd SR, tolerated procedure well, VSS, report to RN 

## 2021-07-02 NOTE — Progress Notes (Signed)
Called to room to assist during endoscopic procedure.  Patient ID and intended procedure confirmed with present staff. Received instructions for my participation in the procedure from the performing physician.  

## 2021-07-06 ENCOUNTER — Telehealth: Payer: Self-pay | Admitting: *Deleted

## 2021-07-06 NOTE — Telephone Encounter (Signed)
Attempted to call patient for their post-procedure follow-up call. No answer. Left voicemail.   

## 2021-07-06 NOTE — Telephone Encounter (Signed)
Attempted 2nd f/u phone call. No answer. Left message.  °

## 2021-07-07 ENCOUNTER — Encounter: Payer: Self-pay | Admitting: Internal Medicine

## 2021-08-03 ENCOUNTER — Other Ambulatory Visit: Payer: BC Managed Care – PPO | Admitting: Internal Medicine

## 2021-08-05 ENCOUNTER — Encounter: Payer: BC Managed Care – PPO | Admitting: Internal Medicine

## 2021-08-17 DIAGNOSIS — G4733 Obstructive sleep apnea (adult) (pediatric): Secondary | ICD-10-CM | POA: Diagnosis not present

## 2021-09-30 ENCOUNTER — Encounter: Payer: Self-pay | Admitting: Internal Medicine

## 2021-09-30 ENCOUNTER — Ambulatory Visit
Admission: RE | Admit: 2021-09-30 | Discharge: 2021-09-30 | Disposition: A | Discharge status: Home / Self Care | Payer: BC Managed Care – PPO | Source: Ambulatory Visit | Attending: Internal Medicine | Admitting: Internal Medicine

## 2021-09-30 ENCOUNTER — Ambulatory Visit (INDEPENDENT_AMBULATORY_CARE_PROVIDER_SITE_OTHER): Payer: BC Managed Care – PPO | Admitting: Internal Medicine

## 2021-09-30 ENCOUNTER — Other Ambulatory Visit: Payer: Self-pay

## 2021-09-30 ENCOUNTER — Telehealth: Payer: Self-pay

## 2021-09-30 VITALS — BP 112/72 | HR 91 | Temp 98.9°F

## 2021-09-30 DIAGNOSIS — R0989 Other specified symptoms and signs involving the circulatory and respiratory systems: Secondary | ICD-10-CM | POA: Diagnosis not present

## 2021-09-30 DIAGNOSIS — R053 Chronic cough: Secondary | ICD-10-CM

## 2021-09-30 DIAGNOSIS — H6693 Otitis media, unspecified, bilateral: Secondary | ICD-10-CM

## 2021-09-30 LAB — CBC WITH DIFFERENTIAL/PLATELET
Absolute Monocytes: 925 cells/uL (ref 200–950)
Basophils Absolute: 20 cells/uL (ref 0–200)
Basophils Relative: 0.3 %
Eosinophils Absolute: 107 cells/uL (ref 15–500)
Eosinophils Relative: 1.6 %
HCT: 38.7 % (ref 35.0–45.0)
Hemoglobin: 13 g/dL (ref 11.7–15.5)
Lymphs Abs: 1333 cells/uL (ref 850–3900)
MCH: 30.4 pg (ref 27.0–33.0)
MCHC: 33.6 g/dL (ref 32.0–36.0)
MCV: 90.6 fL (ref 80.0–100.0)
MPV: 11.8 fL (ref 7.5–12.5)
Monocytes Relative: 13.8 %
Neutro Abs: 4315 cells/uL (ref 1500–7800)
Neutrophils Relative %: 64.4 %
Platelets: 203 10*3/uL (ref 140–400)
RBC: 4.27 10*6/uL (ref 3.80–5.10)
RDW: 12.7 % (ref 11.0–15.0)
Total Lymphocyte: 19.9 %
WBC: 6.7 10*3/uL (ref 3.8–10.8)

## 2021-09-30 MED ORDER — CEFTRIAXONE SODIUM 1 G IJ SOLR
1.0000 g | Freq: Once | INTRAMUSCULAR | Status: AC
  Administered 2021-09-30: 16:00:00 1 g via INTRAMUSCULAR

## 2021-09-30 MED ORDER — AZITHROMYCIN 250 MG PO TABS: ORAL_TABLET | ORAL | 0 refills | Status: AC

## 2021-09-30 MED ORDER — BENZONATATE 100 MG PO CAPS: 100.0000 mg | ORAL_CAPSULE | Freq: Three times a day (TID) | ORAL | 0 refills | Status: DC | PRN

## 2021-09-30 NOTE — Progress Notes (Signed)
? ?  Subjective:  ? ? Patient ID: Maria Mcpherson, female    DOB: [birth year], 66 y.o.   MRN: [id number] ? ?HPI Developed laryngitis early February and head congestion. Afterward, had cough that was lingering. Cough was productive of discolored sputum.  She was exposed to grandchildren who  have had respiratory illnesses. Home Covid test was negative. ?Has to speak at a funeral tomorrow. ? ?History of essential hypertension, vitamin D deficiency, obstructive sleep apnea, hyperlipidemia, dependent edema and impaired glucose tolerance. ? ?History of tonsillectomy and adenoidectomy 1963. ? ?Remote history of pulmonary embolus 2006 after removal of a large ovarian fibroma which apparently got twisted with part of the ovarian vein resulting in left pulmonary embolus. ? ?History of basal cell carcinoma left nose treated with Mohs surgery January 2021 and subsequently had radiation therapy.  Tumor was stage IIIa (pT3 pN0 cM0). ? ? ? ?Review of Systems  Earlier this week,stayed in a hotel for 2 nights. Had fever,chills, T 100.5 degrees this am. Ears have been stopped up for about a month. ? ?   ?Objective:  ? Physical Exam ?Temperature 98.9 degrees pulse oximetry 98% pulse 91 blood pressure 112/72.  She looks a bit fatigued. ?Skin is warm and dry.  No cervical adenopathy.  TMs are slightly full pharynx is slightly injected.  She has some bronchial breath sounds on chest x-ray bilaterally ? ? ?   ?Assessment & Plan:  ?Symptoms from earlier this week sound like a flulike illness with fever and chills.  However, ears have been stopped up for about a month and she has had a prolonged cough.  I would like for her to have a chest x-ray. ? ?Plan: CBC was drawn today and is within normal limits.  White blood cell count 6700.  Hemoglobin 13 g.  Respiratory virus panel was obtained and results are pending.  She was treated with Zithromax Z-PAK 2 tabs day 1 followed by 1 tab days 2 through 5.  May take Tessalon Perles 100 mg 3 times a day  as needed for cough. ? ?Addendum: Chest x-ray shows no pleural effusions or consolidation. ? ?Patient is to call if not better in 7 to 10 days or sooner if worse. ? ?

## 2021-09-30 NOTE — Patient Instructions (Addendum)
Rest and drink plenty of fluids.  Have chest x-ray today.  1 g IM Rocephin given in office.  Take Zithromax Z-PAK:  2 tabs on day 1 followed by 1 tab days 2 through 5.  Tessalon Perles up to 3 times daily as needed for cough.  CBC with differential drawn and pending.  Respiratory virus panel pending. ? ?Addendum: Chest x-ray is negative and respiratory virus panel was still pending as of Friday, March 10. ?

## 2021-09-30 NOTE — Telephone Encounter (Signed)
Fever, sore throat, ear pain x 3 days. She has taken a covid test this am and was negative. She would like to be seen.  ?

## 2021-09-30 NOTE — Telephone Encounter (Signed)
scheduled

## 2021-10-04 LAB — RESPIRATORY VIRUS PANEL
Adenovirus B: DETECTED — AB
HUMAN PARAINFLU VIRUS 1: NOT DETECTED
HUMAN PARAINFLU VIRUS 2: NOT DETECTED
HUMAN PARAINFLU VIRUS 3: NOT DETECTED
INFLUENZA A SUBTYPE H1: NOT DETECTED
INFLUENZA A SUBTYPE H3: NOT DETECTED
Influenza A: NOT DETECTED
Influenza B: NOT DETECTED
Metapneumovirus: NOT DETECTED
Respiratory Syncytial Virus A: NOT DETECTED
Respiratory Syncytial Virus B: NOT DETECTED
Rhinovirus: NOT DETECTED

## 2021-10-14 DIAGNOSIS — Z1231 Encounter for screening mammogram for malignant neoplasm of breast: Secondary | ICD-10-CM | POA: Diagnosis not present

## 2021-10-21 ENCOUNTER — Other Ambulatory Visit: Payer: Self-pay

## 2021-10-21 DIAGNOSIS — R928 Other abnormal and inconclusive findings on diagnostic imaging of breast: Secondary | ICD-10-CM | POA: Diagnosis not present

## 2021-10-21 DIAGNOSIS — N6489 Other specified disorders of breast: Secondary | ICD-10-CM | POA: Diagnosis not present

## 2021-10-21 DIAGNOSIS — R922 Inconclusive mammogram: Secondary | ICD-10-CM | POA: Diagnosis not present

## 2021-10-25 ENCOUNTER — Other Ambulatory Visit: Payer: BC Managed Care – PPO

## 2021-10-25 DIAGNOSIS — E782 Mixed hyperlipidemia: Secondary | ICD-10-CM | POA: Diagnosis not present

## 2021-10-25 DIAGNOSIS — E1169 Type 2 diabetes mellitus with other specified complication: Secondary | ICD-10-CM | POA: Diagnosis not present

## 2021-10-25 DIAGNOSIS — E7849 Other hyperlipidemia: Secondary | ICD-10-CM | POA: Diagnosis not present

## 2021-10-25 DIAGNOSIS — I10 Essential (primary) hypertension: Secondary | ICD-10-CM

## 2021-10-25 DIAGNOSIS — R5383 Other fatigue: Secondary | ICD-10-CM

## 2021-10-25 NOTE — Addendum Note (Signed)
Addended by: Angus Seller on: 10/25/2021 12:12 PM ? ? Modules accepted: Orders ? ?

## 2021-10-25 NOTE — Progress Notes (Signed)
Lab only 

## 2021-10-26 ENCOUNTER — Encounter: Payer: Self-pay | Admitting: Internal Medicine

## 2021-10-26 ENCOUNTER — Ambulatory Visit (INDEPENDENT_AMBULATORY_CARE_PROVIDER_SITE_OTHER): Payer: BC Managed Care – PPO | Admitting: Internal Medicine

## 2021-10-26 VITALS — BP 116/74 | HR 68 | Temp 97.8°F | Wt 190.4 lb

## 2021-10-26 DIAGNOSIS — I10 Essential (primary) hypertension: Secondary | ICD-10-CM

## 2021-10-26 DIAGNOSIS — E785 Hyperlipidemia, unspecified: Secondary | ICD-10-CM

## 2021-10-26 DIAGNOSIS — G4733 Obstructive sleep apnea (adult) (pediatric): Secondary | ICD-10-CM | POA: Diagnosis not present

## 2021-10-26 DIAGNOSIS — Z6830 Body mass index (BMI) 30.0-30.9, adult: Secondary | ICD-10-CM

## 2021-10-26 DIAGNOSIS — E1169 Type 2 diabetes mellitus with other specified complication: Secondary | ICD-10-CM

## 2021-10-26 DIAGNOSIS — Z0000 Encounter for general adult medical examination without abnormal findings: Secondary | ICD-10-CM

## 2021-10-26 DIAGNOSIS — E782 Mixed hyperlipidemia: Secondary | ICD-10-CM | POA: Diagnosis not present

## 2021-10-26 LAB — LIPID PANEL
Cholesterol: 228 mg/dL — ABNORMAL HIGH (ref <200)
HDL: 79 mg/dL (ref >=50)
LDL Cholesterol (Calc): 122 mg/dL (calc) — ABNORMAL HIGH
Non-HDL Cholesterol (Calc): 149 mg/dL (calc) — ABNORMAL HIGH (ref <130)
Total CHOL/HDL Ratio: 2.9 (calc) (ref <5.0)
Triglycerides: 156 mg/dL — ABNORMAL HIGH (ref <150)

## 2021-10-26 LAB — CBC WITH DIFFERENTIAL/PLATELET
Absolute Monocytes: 433 cells/uL (ref 200–950)
Basophils Absolute: 20 cells/uL (ref 0–200)
Basophils Relative: 0.5 %
Eosinophils Absolute: 261 cells/uL (ref 15–500)
Eosinophils Relative: 6.7 %
HCT: 41.2 % (ref 35.0–45.0)
Hemoglobin: 13.6 g/dL (ref 11.7–15.5)
Lymphs Abs: 1373 cells/uL (ref 850–3900)
MCH: 30.1 pg (ref 27.0–33.0)
MCHC: 33 g/dL (ref 32.0–36.0)
MCV: 91.2 fL (ref 80.0–100.0)
MPV: 10.9 fL (ref 7.5–12.5)
Monocytes Relative: 11.1 %
Neutro Abs: 1814 cells/uL (ref 1500–7800)
Neutrophils Relative %: 46.5 %
Platelets: 255 10*3/uL (ref 140–400)
RBC: 4.52 10*6/uL (ref 3.80–5.10)
RDW: 12.8 % (ref 11.0–15.0)
Total Lymphocyte: 35.2 %
WBC: 3.9 10*3/uL (ref 3.8–10.8)

## 2021-10-26 LAB — POCT URINALYSIS DIPSTICK
Bilirubin, UA: NEGATIVE
Blood, UA: NEGATIVE
Glucose, UA: NEGATIVE
Ketones, UA: NEGATIVE
Leukocytes, UA: NEGATIVE
Nitrite, UA: NEGATIVE
Protein, UA: NEGATIVE
Spec Grav, UA: 1.015 (ref 1.010–1.025)
Urobilinogen, UA: 0.2 E.U./dL
pH, UA: 6.5 (ref 5.0–8.0)

## 2021-10-26 LAB — COMPLETE METABOLIC PANEL WITH GFR
AG Ratio: 1.6 (calc) (ref 1.0–2.5)
ALT: 21 U/L (ref 6–29)
AST: 24 U/L (ref 10–35)
Albumin: 4.5 g/dL (ref 3.6–5.1)
Alkaline phosphatase (APISO): 66 U/L (ref 37–153)
BUN: 23 mg/dL (ref 7–25)
CO2: 33 mmol/L — ABNORMAL HIGH (ref 20–32)
Calcium: 9.5 mg/dL (ref 8.6–10.4)
Chloride: 103 mmol/L (ref 98–110)
Creat: 0.86 mg/dL (ref 0.50–1.05)
Globulin: 2.8 g/dL (calc) (ref 1.9–3.7)
Glucose, Bld: 94 mg/dL (ref 65–99)
Potassium: 3.7 mmol/L (ref 3.5–5.3)
Sodium: 143 mmol/L (ref 135–146)
Total Bilirubin: 0.8 mg/dL (ref 0.2–1.2)
Total Protein: 7.3 g/dL (ref 6.1–8.1)
eGFR: 75 mL/min/{1.73_m2} (ref >=60)

## 2021-10-26 LAB — HEMOGLOBIN A1C
Hgb A1c MFr Bld: 5.8 % of total Hgb — ABNORMAL HIGH (ref <5.7)
Mean Plasma Glucose: 120 mg/dL
eAG (mmol/L): 6.6 mmol/L

## 2021-10-26 LAB — TSH: TSH: 1.13 mIU/L (ref 0.40–4.50)

## 2021-10-26 NOTE — Progress Notes (Signed)
? ?Subjective:  ? ? Patient ID: Maria Mcpherson, female    DOB: Feb 16, 1956, 66 y.o.   MRN: [id number] ? ?HPI 66 year old Female seen for health maintenance exam and evaluation of medical issues. ? In March, had acute  Adenovirus B manifesting as a respiratory infection and flu-like illness. Other family member had same dx by another physician.She has recovered. ? ?She has a history of essential hypertension, vitamin D deficiency, obstructive sleep apnea, hyperlipidemia, dependent edema and impaired glucose tolerance. ? ?History of basal cell carcinoma left nose removed with Mohs procedure. ? ?In 09/07/15 she had left hip arthroplasty by Dr. Ninfa Linden. ? ?In 09-06-2013 she had postmenopausal bleeding and saw Dr. Edwinna Areola.  She had an endometrial biopsy that showed proliferative endometrium.  She was found to have several fibroids and these are being observed. ? ?History of fibrocystic breast disease. ? ?History of tonsillectomy and adenoidectomy 09/06/61. ? ?Last colonoscopy was December 2022. ? ?She has been vaccinated for hepatitis A, hepatitis B, MMR and typhoid due to foreign travel. ? ?She had pulmonary embolus September 2006 after removal of a large ovarian fibroma which apparently got twisted with part of the ovarian vein resulting in left pulmonary embolism.  She was treated with anticoagulation and has never had recurrence.  She only had right oophorectomy and not a complete hysterectomy. ? 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  She is married.  Son passed away 09-07-07.  1 daughter who has had some issues with alcohol addiction.  Patient does not smoke or consume alcohol. ? ?Family history: Mother with history of breast cancer and hypertension.  Maternal grandmother with history of stroke.  Father with history of leukemia.  2 brothers in good health.  1 sister in good health. ? ? ?Hx hyperlipidemia. Have ordered coronary calcium scoring. ? ?Review of  her labs shows hemoglobin A1c of 5.8% and was 6.1% in June 2022.  She does have mixed hyperlipidemia with total cholesterol 228, HDL 79, triglycerides 156 and LDL cholesterol 122.  Previously prescribed simvastatin 40 mg daily.  She may not have been taking it recently. ? ?Has mammogram at [facility] on an annual basis. ? ?History of iron deficiency due to frequent blood donation.  Has cut back on the frequency of donating blood. ? ? ? ?Review of Systems no new issues ? ?   ?Objective:  ? Physical Exam ? ?Blood pressure 116/74 left arm pulse 68 pulse oximetry 98% she is afebrile weight 190 pounds 6.4 ounces BMI 30.73 ?No thyromegaly or carotid bruits.  Chest clear.  TMs are clear.  Neck is supple.  Cardiac exam: Regular rate and rhythm without ectopy or murmurs.  Abdomen soft nondistended without hepatosplenomegaly masses or tenderness.  Breasts are without palpable masses.  No lower extremity pitting edema.  Affect thought and judgment are normal.  Neuro exam: No gross focal deficits. ? ?   ?Assessment & Plan:  ?Essential hypertension stable with amlodipine, furosemide,  metoprolol as well as losartan. ? ?Hyperlipidemia takes simvastatin 40 mg daily ? ?BMI 30.73-we will work on diet exercise and weight loss ? ?History of impaired glucose tolerance-hemoglobin A1c 5.8% ? ?History of sleep apnea-has CPAP device ? ?Dependent edema treated with diuretic ? ?History of left hip arthroplasty 09-07-15 ? ?Remote history of pulmonary embolus related to GYN surgery ? ?History of iron deficiency due to frequent blood donations.  Ferritin was 9 in January 2022.  This has  resolved. ? ?Plan: Patient will continue current medications.  She will work on diet exercise and weight loss and return in September 2023.  Coronary calcium scoring ordered. ? ?

## 2021-10-26 NOTE — Patient Instructions (Addendum)
Continue antihypertensive medications and metformin. Have ordered coronary calcium scoring at [facility]. RTC in  6 months or as needed. Try to work on diet and exercise. Immunizations discussed. Colonoscopy due 2025. Continue statin medication. ?

## 2021-11-07 ENCOUNTER — Other Ambulatory Visit: Payer: Self-pay | Admitting: Internal Medicine

## 2021-11-24 ENCOUNTER — Ambulatory Visit (HOSPITAL_COMMUNITY)
Admission: RE | Admit: 2021-11-24 | Discharge: 2021-11-24 | Disposition: A | Discharge status: Home / Self Care | Payer: BC Managed Care – PPO | Source: Ambulatory Visit | Attending: Internal Medicine | Admitting: Internal Medicine

## 2021-11-24 ENCOUNTER — Encounter (HOSPITAL_COMMUNITY): Payer: Self-pay

## 2021-11-24 DIAGNOSIS — E785 Hyperlipidemia, unspecified: Secondary | ICD-10-CM | POA: Insufficient documentation

## 2021-11-24 DIAGNOSIS — E1169 Type 2 diabetes mellitus with other specified complication: Secondary | ICD-10-CM | POA: Insufficient documentation

## 2021-11-26 ENCOUNTER — Other Ambulatory Visit: Payer: Self-pay

## 2021-11-26 MED ORDER — ROSUVASTATIN CALCIUM 10 MG PO TABS: 10.0000 mg | ORAL_TABLET | Freq: Every day | ORAL | 3 refills | Status: DC

## 2022-01-10 ENCOUNTER — Other Ambulatory Visit: Payer: Self-pay | Admitting: Internal Medicine

## 2022-02-21 ENCOUNTER — Other Ambulatory Visit: Payer: Self-pay | Admitting: Internal Medicine

## 2022-03-01 ENCOUNTER — Other Ambulatory Visit: Payer: BC Managed Care – PPO

## 2022-03-01 DIAGNOSIS — E782 Mixed hyperlipidemia: Secondary | ICD-10-CM

## 2022-03-02 ENCOUNTER — Encounter (INDEPENDENT_AMBULATORY_CARE_PROVIDER_SITE_OTHER): Payer: Self-pay

## 2022-03-02 DIAGNOSIS — G4733 Obstructive sleep apnea (adult) (pediatric): Secondary | ICD-10-CM | POA: Diagnosis not present

## 2022-03-02 LAB — LIPID PANEL
Cholesterol: 177 mg/dL (ref <200)
HDL: 73 mg/dL (ref >=50)
LDL Cholesterol (Calc): 78 mg/dL (calc)
Non-HDL Cholesterol (Calc): 104 mg/dL (calc) (ref <130)
Total CHOL/HDL Ratio: 2.4 (calc) (ref <5.0)
Triglycerides: 160 mg/dL — ABNORMAL HIGH (ref <150)

## 2022-03-03 ENCOUNTER — Encounter: Payer: Self-pay | Admitting: Internal Medicine

## 2022-03-03 ENCOUNTER — Ambulatory Visit (INDEPENDENT_AMBULATORY_CARE_PROVIDER_SITE_OTHER): Payer: BC Managed Care – PPO | Admitting: Internal Medicine

## 2022-03-03 VITALS — BP 110/84 | HR 65 | Temp 97.9°F | Wt 187.0 lb

## 2022-03-03 DIAGNOSIS — E785 Hyperlipidemia, unspecified: Secondary | ICD-10-CM

## 2022-03-03 DIAGNOSIS — I700 Atherosclerosis of aorta: Secondary | ICD-10-CM

## 2022-03-03 DIAGNOSIS — R918 Other nonspecific abnormal finding of lung field: Secondary | ICD-10-CM

## 2022-03-03 DIAGNOSIS — E782 Mixed hyperlipidemia: Secondary | ICD-10-CM

## 2022-03-03 DIAGNOSIS — I10 Essential (primary) hypertension: Secondary | ICD-10-CM | POA: Diagnosis not present

## 2022-03-03 DIAGNOSIS — E1169 Type 2 diabetes mellitus with other specified complication: Secondary | ICD-10-CM

## 2022-03-03 LAB — POCT URINALYSIS DIPSTICK
Bilirubin, UA: NEGATIVE
Blood, UA: NEGATIVE
Glucose, UA: NEGATIVE
Ketones, UA: NEGATIVE
Leukocytes, UA: NEGATIVE
Nitrite, UA: NEGATIVE
Protein, UA: NEGATIVE
Spec Grav, UA: 1.01 (ref 1.010–1.025)
Urobilinogen, UA: 0.2 E.U./dL
pH, UA: 6 (ref 5.0–8.0)

## 2022-03-03 NOTE — Progress Notes (Signed)
   Subjective:    Patient ID: Maria Mcpherson, female    DOB: 1955/11/08, 66 y.o.   MRN: [id number]  HPI 66 year old Female seen for follow up on mixed hyperlipidemia.  In April, her total cholesterol was 228, HDL 79, triglycerides 156 and LDL cholesterol 122.  This was at her routine health maintenance exam.  Coronary calcium scoring was ordered in May showing aortic atherosclerosis and 2 left lower lobe pulmonary nodules measuring up to 6 mm.  I would suggest we revisit the pulmonary nodules in 12 months with another CT of the chest.  Her total coronary calcium score was 39.8.  Recommendations for this is that it is reasonable for her to be on a statin since she is over 86 years old.  She is currently on Crestor 10 mg daily.  Her 70-monthrecheck is due in November and I think it is reasonable to reassess this at that time with fasting lipid panel and office visit.  She is agreeable to this plan.  She has begun to slow down a bit at work and says she will be going on a Medicare advantage plan soon.  Therefore her next physical exam will be welcome to Medicare physical exam.  We discussed having a COVID booster in the Fall when the new variant vaccine comes out as well is a flu vaccine in the Fall.  It is also prudent to consider pneumococcal 20 vaccine especially if she is going to be traveling.  She will be planning to go on a cruise in the Fall as well.  Her tetanus immunization is up-to-date.  Blood pressure stable on Lasix 40 mg daily and amlodipine 2.5 mg daily.  Remains on metformin 1000 mg daily for impaired glucose tolerance  Review of Systems she feels well and has no new complaints     Objective:   Physical Exam  Blood pressure 110/84, pulse 65,, temperature 97.9 degrees ,pulse oximetry 97%, weight 187 pounds, BMI 30.18  Skin: Warm and dry.  No cervical adenopathy, thyromegaly or carotid bruits.  Chest is clear.  Cardiac exam: Regular rate and rhythm without ectopy.       Assessment & Plan:  Hyperlipidemia associated with impaired glucose tolerance treated with statin medication and metformin.  Statin therapy indicated as she is over 89years of age.  Coronary calcium score is 39.8 suggesting atherosclerosis in LAD and right coronary artery.  I do think we should check her hemoglobin A1c in November as well as her lipid panel.  Pulmonary nodules-consider follow-up chest CT in 12 months  Essential hypertension stable on Lasix 40 mg daily and amlodipine 2.5 mg daily

## 2022-03-03 NOTE — Patient Instructions (Addendum)
Vaccines discussed especially with upcoming travel.  Patient will take Crestor 10 mg daily and follow-up in November with fasting lipid panel and 63-monthrecheck.  Her blood pressure is under good control on current regimen.

## 2022-03-07 ENCOUNTER — Other Ambulatory Visit: Payer: Self-pay | Admitting: Internal Medicine

## 2022-04-02 DIAGNOSIS — G4733 Obstructive sleep apnea (adult) (pediatric): Secondary | ICD-10-CM | POA: Diagnosis not present

## 2022-04-21 ENCOUNTER — Other Ambulatory Visit: Payer: BC Managed Care – PPO

## 2022-04-22 ENCOUNTER — Other Ambulatory Visit: Payer: BC Managed Care – PPO

## 2022-04-25 ENCOUNTER — Ambulatory Visit: Payer: BC Managed Care – PPO | Admitting: Internal Medicine

## 2022-04-26 ENCOUNTER — Ambulatory Visit: Payer: BC Managed Care – PPO | Admitting: Internal Medicine

## 2022-05-31 ENCOUNTER — Other Ambulatory Visit: Payer: Medicare Other

## 2022-05-31 DIAGNOSIS — E7849 Other hyperlipidemia: Secondary | ICD-10-CM

## 2022-05-31 DIAGNOSIS — E1169 Type 2 diabetes mellitus with other specified complication: Secondary | ICD-10-CM

## 2022-06-01 LAB — HEMOGLOBIN A1C
Hgb A1c MFr Bld: 6.1 % of total Hgb — ABNORMAL HIGH (ref <5.7)
Mean Plasma Glucose: 128 mg/dL
eAG (mmol/L): 7.1 mmol/L

## 2022-06-01 LAB — LIPID PANEL
Cholesterol: 200 mg/dL — ABNORMAL HIGH (ref <200)
HDL: 70 mg/dL (ref >=50)
LDL Cholesterol (Calc): 105 mg/dL (calc) — ABNORMAL HIGH
Non-HDL Cholesterol (Calc): 130 mg/dL (calc) — ABNORMAL HIGH (ref <130)
Total CHOL/HDL Ratio: 2.9 (calc) (ref <5.0)
Triglycerides: 132 mg/dL (ref <150)

## 2022-06-02 ENCOUNTER — Encounter: Payer: Self-pay | Admitting: Internal Medicine

## 2022-06-02 ENCOUNTER — Ambulatory Visit (INDEPENDENT_AMBULATORY_CARE_PROVIDER_SITE_OTHER): Payer: Medicare Other | Admitting: Internal Medicine

## 2022-06-02 VITALS — BP 110/80 | HR 62 | Temp 97.9°F | Ht 66.0 in | Wt 192.0 lb

## 2022-06-02 DIAGNOSIS — E785 Hyperlipidemia, unspecified: Secondary | ICD-10-CM | POA: Diagnosis not present

## 2022-06-02 DIAGNOSIS — E782 Mixed hyperlipidemia: Secondary | ICD-10-CM

## 2022-06-02 DIAGNOSIS — Z23 Encounter for immunization: Secondary | ICD-10-CM

## 2022-06-02 DIAGNOSIS — I10 Essential (primary) hypertension: Secondary | ICD-10-CM

## 2022-06-02 DIAGNOSIS — G4733 Obstructive sleep apnea (adult) (pediatric): Secondary | ICD-10-CM | POA: Diagnosis not present

## 2022-06-02 DIAGNOSIS — E1169 Type 2 diabetes mellitus with other specified complication: Secondary | ICD-10-CM | POA: Diagnosis not present

## 2022-06-02 DIAGNOSIS — Z6830 Body mass index (BMI) 30.0-30.9, adult: Secondary | ICD-10-CM

## 2022-06-02 DIAGNOSIS — Z7185 Encounter for immunization safety counseling: Secondary | ICD-10-CM

## 2022-06-02 DIAGNOSIS — I700 Atherosclerosis of aorta: Secondary | ICD-10-CM

## 2022-06-02 MED ORDER — SITAGLIPTIN PHOSPHATE 50 MG PO TABS: 50.0000 mg | ORAL_TABLET | Freq: Every day | ORAL | 1 refills | Status: DC

## 2022-06-02 MED ORDER — METFORMIN HCL 500 MG PO TABS: 500.0000 mg | ORAL_TABLET | Freq: Two times a day (BID) | ORAL | 3 refills | Status: DC

## 2022-06-02 NOTE — Progress Notes (Addendum)
Subjective:    Patient ID: Maria Mcpherson, female    DOB: 02-25-1956, 66 y.o.   MRN: [id number]  HPI 66 year old Female seen for  follow up visit. She is now on Medicare.  Pneumococcal 20 vaccine given today. May want to get RSV vaccine at pharmacy as she is around grandchildren.Recently had high dose flu vaccine at pharmacy. Last Covid vaccine was September 2022. She does not plan to get booster this year. Tdap is up to date.Consider Shingrix vaccines(2) at pharmacy as well.  She has a history of mixed hyperlipidemia.  She had coronary calcium score in May.  Total score was excellent at 39.8.  Score in LAD was 3.4 and score in right coronary artery 36.4.  She did have 2 adjacent left lower lobe pulmonary nodules on CT measuring up to 6 mm.  Radiologist recommends noncontrast chest CT in 3 to 6 months.  I think it is reasonable to repeat noncontrast chest CT in May 2024.  She is a non-smoker.  Hypertension stable on amlodipine 2.5 mg daily as well as Lasix 40 mg daily and losartan 100 mg daily.  She also takes metoprolol 50 mg once daily.  She does take a potassium supplementation due to being on Lasix.  Potassium in April was 3.7.  This will be repeated in February at time of her [facility] to [facility] wellness visit.  For hyperlipidemia she is now on rosuvastatin 10 mg daily.  Total cholesterol is 200, HDL 70, triglycerides 132 and LDL 105.  Regarding glucose intolerance hemoglobin A1c is 6.1% and was 5.8% in April.  Has been on metformin.  I would like to see this under 6% and I am recommending Januvia 50 mg daily.  She will continue with metformin.  She will follow-up here in February for her Medicare wellness visit and fasting labs.  Had screening colonoscopy by Dr. Lorenso Courier in December 2022.  4 small polyps removed all of which were precancerous and 3-year follow-up recommended.  Had mammogram at [facility] and March 2023.       Review of Systems see above-no new complaints     Objective:    Physical Exam  Blood pressure excellent at 110/80 pulse 62 temperature 97.9 degrees pulse oximetry 98% weight 192 pounds BMI 30.99 Neck is supple.  No carotid bruits or thyromegaly.  Chest clear.  Cardiac exam: Regular rate and rhythm without ectopy.  No pitting edema of the lower extremities.  Has trace edema at best.     Assessment & Plan:  Essential hypertension-blood pressure excellent at present on current regimen  Impaired glucose tolerance-hemoglobin A1c 6.1% and was 5.8% in April.  Add Januvia.  Follow-up in February at time of welcome to Medicare visit.Increase metformin to twice daily.This has been ordered today.  BMI 30.99-would like to see BMI ideally around 25 or 26.  Continue with diet and exercise efforts.  History of sleep apnea-has CPAP device  Health maintenance exam-pneumococcal 20 vaccine given.  Other vaccines discussed.  Mammogram due April 2024.  No DEXA scan since September 2017.  This should be repeated.  Colonoscopy is up-to-date with 3-year follow-up recommended.  History of mixed hyperlipidemia treated with statin-Crestor 10 mg daily.  Coronary calcium score 39.8.   Pulmonary nodules x2 left lower lobe measuring up to 6 mm.  She is low risk for malignancy.  Discuss chest CT noncontrast at next visit.  History of left hip arthroplasty 2017  Dr. Carron Brazen GYN physician.  Had endometrial biopsy in 2015  showing proliferative endometrium.  Had several fibroids that are being observed   Addendum: November 14th: Januvia is too expensive. Will try Onglyza 5 mg daily. Addendum:

## 2022-06-02 NOTE — Patient Instructions (Addendum)
Vaccines discussed. Pneumococcal 20 given. Continue current meds and follow up with Welcome to Medicare visit in February.  Januvia added for increased hemoglobin A1c.  Please work on diet exercise and weight loss.  Written order sent to [facility] for bone density study and mammogram.  Continue statin medication.  Repeat CT of chest for surveillance of 2 left lower nodules to be discussed at next visit in February.

## 2022-06-03 ENCOUNTER — Other Ambulatory Visit: Payer: Self-pay

## 2022-06-03 DIAGNOSIS — Z1231 Encounter for screening mammogram for malignant neoplasm of breast: Secondary | ICD-10-CM

## 2022-06-03 DIAGNOSIS — Z780 Asymptomatic menopausal state: Secondary | ICD-10-CM

## 2022-06-03 LAB — MICROALBUMIN / CREATININE URINE RATIO
Creatinine, Urine: 20 mg/dL (ref 20–275)
Microalb, Ur: <0.2 mg/dL

## 2022-06-07 ENCOUNTER — Other Ambulatory Visit: Payer: Self-pay

## 2022-06-07 ENCOUNTER — Telehealth: Payer: Self-pay | Admitting: Internal Medicine

## 2022-06-07 MED ORDER — SAXAGLIPTIN HCL 5 MG PO TABS: 5.0000 mg | ORAL_TABLET | Freq: Every day | ORAL | 2 refills | Status: DC

## 2022-06-07 NOTE — Telephone Encounter (Signed)
Maria Mcpherson [id number]  [name] called to say when she went to the pharmacy to pick up the [place] it was over $500 and they do not have generic yet and the coupon is for people not on Medicare, so she does not want to take that.  She said she is willing to try the metformin 500 mg twice a day

## 2022-06-07 NOTE — Telephone Encounter (Signed)
Spoke with pharmacy staff and patient is in the donut hole for Januvia - Onglyza '5mg'$  for 30 day supply ($49.57 estimate) Called to informed patient and no answer left voicemail

## 2022-06-07 NOTE — Telephone Encounter (Signed)
Left detailed message for patient to take Rx as prescribed

## 2022-06-07 NOTE — Telephone Encounter (Signed)
Patient informed of changes made from Januvia to [place] and will pick up Rx from pharmacy.  Patient stated during the office visit a discussion was had about a possible dose change with metformin 500 mg and wants to know if she is to take it once a day or twice a day.

## 2022-07-06 ENCOUNTER — Other Ambulatory Visit: Payer: Self-pay

## 2022-07-06 ENCOUNTER — Other Ambulatory Visit: Payer: Self-pay | Admitting: Internal Medicine

## 2022-07-06 MED ORDER — SAXAGLIPTIN HCL 5 MG PO TABS: 5.0000 mg | ORAL_TABLET | Freq: Every day | ORAL | 2 refills | Status: DC

## 2022-07-19 ENCOUNTER — Telehealth: Payer: Self-pay

## 2022-07-19 ENCOUNTER — Encounter: Payer: Self-pay | Admitting: Internal Medicine

## 2022-07-19 NOTE — Telephone Encounter (Signed)
Transition Care Management Unsuccessful Follow-up Telephone Call  Date of discharge and from where:  07/18/2022 from [facility]   Attempts:  1st Attempt  Reason for unsuccessful TCM follow-up call:  Left voice message

## 2022-07-20 MED ORDER — METOPROLOL TARTRATE 50 MG PO TABS: 50.0000 mg | ORAL_TABLET | Freq: Every day | ORAL | 1 refills | Status: DC

## 2022-07-20 MED ORDER — LOSARTAN POTASSIUM 100 MG PO TABS: 100.0000 mg | ORAL_TABLET | Freq: Every day | ORAL | 1 refills | Status: DC

## 2022-07-20 MED ORDER — FUROSEMIDE 40 MG PO TABS: 40.0000 mg | ORAL_TABLET | Freq: Every day | ORAL | 1 refills | Status: DC

## 2022-07-20 MED ORDER — METFORMIN HCL 500 MG PO TABS: 500.0000 mg | ORAL_TABLET | Freq: Two times a day (BID) | ORAL | 1 refills | Status: DC

## 2022-07-20 NOTE — Telephone Encounter (Signed)
Transition Care Management Follow-up Telephone Call Date of discharge and from where: 07/18/2022 from [facility]  How have you been since you were released from the hospital? Patient stated that she is feeling better. Patient shas started her abx as rx'ed by [facility]. Patient did not have any questions or concerns at this time.  Any questions or concerns? No  Items Reviewed: Did the pt receive and understand the discharge instructions provided? Yes  Medications obtained and verified? Yes  Other? No  Any new allergies since your discharge? No  Dietary orders reviewed? No Do you have support at home? Yes   Functional Questionnaire: (I = Independent and D = Dependent) ADLs: I  Bathing/Dressing- I  Meal Prep- I  Eating- I  Maintaining continence- I  Transferring/Ambulation- I  Managing Meds- I   Follow up appointments reviewed:  PCP Hospital f/u appt confirmed? Yes  pcp appt but not for ED follow up.  [facility] f/u appt confirmed? No  Are transportation arrangements needed? No  If their condition worsens, is the pt aware to call PCP or go to the Emergency Dept.? Yes Was the patient provided with contact information for the PCP's office or ED? Yes Was to pt encouraged to call back with questions or concerns? Yes

## 2022-07-20 NOTE — Telephone Encounter (Signed)
Rx sent to the pharmacy.

## 2022-07-21 NOTE — Telephone Encounter (Signed)
Scheduled 07/29/2022

## 2022-07-29 ENCOUNTER — Ambulatory Visit (INDEPENDENT_AMBULATORY_CARE_PROVIDER_SITE_OTHER): Payer: Medicare Other | Admitting: Internal Medicine

## 2022-07-29 ENCOUNTER — Encounter: Payer: Self-pay | Admitting: Internal Medicine

## 2022-07-29 VITALS — BP 120/82 | HR 69 | Temp 98.6°F | Ht 66.0 in | Wt 190.4 lb

## 2022-07-29 DIAGNOSIS — J189 Pneumonia, unspecified organism: Secondary | ICD-10-CM | POA: Diagnosis not present

## 2022-07-29 NOTE — Patient Instructions (Signed)
We will plan to re-Xray around Jan 25th to make sure pneumonia has resolved. She will call us around that time for an order  for: CXR PA and lateral for F/u RLL pneumonia

## 2022-07-29 NOTE — Progress Notes (Signed)
   Subjective:    Patient ID: Maria Mcpherson, female    DOB: 1955/09/14, 67 y.o.   MRN: [id number]  HPI 67 year old Female seen in Emergency department at [facility] on Christmas Day (07/18/2022) with 3-week history of prolonged cough.  Initially seemed to improve but then  got worse.  Came to the emergency department with onset of fever a few days previously.  She has a history of hypertension, type 2 diabetes mellitus, sleep apnea with CPAP device.  Chest x-ray showed right lower lobe consolidation consistent with pneumonia.  Patient has history of ampicillin allergy and has not had penicillin containing antibiotics since she was 67 years of age.  Was given Zithromax 2 tabs day 1 followed by 1 tab days 2 through 5 and Omnicef 300 mg twice daily for 7 days.  She is feeling much better but still has some fatigue.  No fever or shaking chills.  Cough has improved considerably.   Review of Systems see above no fever, chills, nausea, vomiting.  Cough has improved considerably     Objective:   Physical Exam   Temperature 98.6 degrees,pulse 69 regular, respiratory rate is normal.  Pulse oximetry 98% on room air weight 190 pounds 6.4 ounces BMI 30.73  Pharynx and TMs are clear.  Neck supple.  Chest is completely clear to auscultation without rales or wheezing.  She is in no distress and is not heard to be coughing.     Assessment & Plan:  Right lower lobe pneumonia-resolved after treatment with Zithromax and Omnicef  Plan: We discussed repeat chest x-ray but lungs are clear and I think she is feeling much better.  We will plan to rex-ray around January 25 to make sure pneumonia has resolved.  She will call us around that time for an order.  Addendum: August 10, 2022-order has been placed for repeat chest x-ray at [place] on [address] for January 25

## 2022-08-16 ENCOUNTER — Telehealth: Payer: Self-pay | Admitting: Internal Medicine

## 2022-08-16 NOTE — Telephone Encounter (Signed)
Maria Mcpherson called to say she is feeling better and she wants to go on Monday 08/22/2022 to get follow up chest x-ray, from her last office visit. Please call and let her know order is in and ready for her to go and get anytime on Monday.

## 2022-08-21 ENCOUNTER — Other Ambulatory Visit: Payer: Self-pay | Admitting: Internal Medicine

## 2022-08-23 NOTE — Telephone Encounter (Signed)
Called patient to check on how she is doing and she stated she is perfectly fine.  I asked if she still wants to get the chest X-ray and she stated she did not think she needed it and was advised to call the office if she needs anything.

## 2022-09-05 ENCOUNTER — Telehealth: Payer: Self-pay | Admitting: *Deleted

## 2022-09-05 NOTE — Telephone Encounter (Signed)
Patient has not been seen in the office since 07/26/2018, she will need to be rescheduled with an MD (Dr. Ander Slade last saw her).  She is a new consult after 3 years.  Sent to front staff Claiborne Billings Clearly) to reschedule.

## 2022-09-06 ENCOUNTER — Encounter: Payer: Self-pay | Admitting: Adult Health

## 2022-09-06 ENCOUNTER — Ambulatory Visit (INDEPENDENT_AMBULATORY_CARE_PROVIDER_SITE_OTHER): Payer: Medicare Other | Admitting: Adult Health

## 2022-09-06 VITALS — BP 120/80 | HR 72 | Temp 98.1°F | Ht 66.5 in | Wt 191.4 lb

## 2022-09-06 DIAGNOSIS — G4733 Obstructive sleep apnea (adult) (pediatric): Secondary | ICD-10-CM | POA: Diagnosis not present

## 2022-09-06 DIAGNOSIS — E669 Obesity, unspecified: Secondary | ICD-10-CM

## 2022-09-06 DIAGNOSIS — Z6830 Body mass index (BMI) 30.0-30.9, adult: Secondary | ICD-10-CM

## 2022-09-06 NOTE — Assessment & Plan Note (Addendum)
Excellent control and compliance on nocturnal CPAP  Plan  Patient Instructions  Continue on CPAP at bedtime Keep up the good work Work on [employer] sleeping Follow-up in 1 year with Dr. Ander Slade and As needed

## 2022-09-06 NOTE — Patient Instructions (Addendum)
Continue on CPAP at bedtime Keep up the good work Work on [employer] not [address] sleeping Follow-up in 1 year with Dr. Ander Slade and As needed

## 2022-09-06 NOTE — Progress Notes (Signed)
$@PatientJ$  ID: Maria Mcpherson, female    DOB: 1956-06-18, 67 y.o.   MRN: [id number]  Chief Complaint  Patient presents with   Consult    Referring provider: Elby Showers, MD  HPI: 67 year old female followed for obstructive sleep apnea on nocturnal CPAP.  Patient was last seen in the office in January 2020.  TEST/EVENTS :  Home sleep study March 2017> AHI of 22   09/06/2022 Follow up ; OSA Patient presents today to reestablish for sleep apnea.  Patient was last seen in January 2020.  She has underlying moderate obstructive sleep apnea is on nocturnal CPAP.  Patient says she wears her CPAP every single night.  She is here to get new supplies.  She is currently using a nasal mask.  And uses [facility].  Patient says she cannot sleep without her CPAP.  Feels that she benefits from CPAP.  She is currently on CPAP AutoSet 5 to 10 cm H2O.  CPAP download shows excellent compliance with 100% usage.  Daily average usage at 8 hours.  Patient's AHI was 0.4/hour.   Allergies  Allergen Reactions   Ampicillin Rash    Has patient had a PCN reaction causing immediate rash, facial/tongue/throat swelling, SOB or lightheadedness with hypotension: Unsure Has patient had a PCN reaction causing severe rash involving mucus membranes or skin necrosis: No Has patient had a PCN reaction that required hospitalization No Has patient had a PCN reaction occurring within the last 10 years: No If all of the above answers are "NO", then may proceed with Cephalosporin use.    Immunization History  Administered Date(s) Administered   Fluad Quad(high Dose 65+) 03/30/2022   Influenza Inj Mdck Quad With Preservative 06/30/2018   Influenza Split 04/18/2012, 05/02/2013, 04/29/2015, 04/24/2017   Influenza,inj,Quad PF,6+ Mos 06/25/2018, 05/09/2019   Influenza-Unspecified 04/24/2014, 05/04/2016, 05/13/2020, 05/10/2021   Moderna Sars-Covid-2 Vaccination 10/16/2019, 11/13/2019   PFIZER(Purple Top)SARS-COV-2 Vaccination  06/04/2020, 04/16/2021   PNEUMOCOCCAL CONJUGATE-20 06/02/2022   Pfizer Covid-19 Vaccine Bivalent Booster 70yr & up 04/16/2021   Tdap 09/08/2010, 08/04/2020    Past Medical History:  Diagnosis Date   Arthritis    Bilateral swelling of legs and ankles    Borderline diabetes    Fibrocystic breast disease    Heart murmur    very slight   History of nonmelanoma skin cancer    HTN (hypertension)    Hx of blood clots    Hyperlipidemia    Hypokalemia    Prediabetes    pulmonary emboli 04/2005   same time as oophrectomy/myomectomy, evlauation was negative   Skin cancer    Sleep apnea    CPAP   Past Surgical History:  Procedure Laterality Date   COLONOSCOPY  11/05/2009   OOPHORECTOMY  04/24/2005   only had one ovary removed unsure of rt or left   TONSILLECTOMY AND ADENOIDECTOMY  07/25/1961   TOTAL HIP ARTHROPLASTY Left 08/28/2015   Procedure: LEFT TOTAL HIP ARTHROPLASTY ANTERIOR APPROACH;  Surgeon: CMcarthur Rossetti MD;  Location: [facility];  Service: Orthopedics;  Laterality: Left;     Tobacco History: Social History   Tobacco Use  Smoking Status Never  Smokeless Tobacco Never   Counseling given: Not Answered   Outpatient Medications Prior to Visit  Medication Sig Dispense Refill   amLODipine (NORVASC) 2.5 MG tablet TAKE 1 TABLET BY MOUTH EVERY DAY 90 tablet 1   calcium carbonate (OS-CAL) 600 MG TABS Take 600 mg by mouth 2 (two) times daily with a meal.  fish oil-omega-3 fatty acids 1000 MG capsule Take 1 g by mouth daily.     furosemide (LASIX) 40 MG tablet Take 1 tablet (40 mg total) by mouth daily. 90 tablet 1   loratadine (CLARITIN) 10 MG tablet Take 10 mg by mouth daily.     losartan (COZAAR) 100 MG tablet Take 1 tablet (100 mg total) by mouth daily. 90 tablet 1   metFORMIN (GLUCOPHAGE) 500 MG tablet Take 1 tablet (500 mg total) by mouth 2 (two) times daily with a meal. 90 tablet 1   metoprolol tartrate (LOPRESSOR) 50 MG tablet Take 1 tablet (50 mg total) by  mouth daily. 90 tablet 1   Multiple Vitamin (MULTIVITAMIN) capsule Take 1 capsule by mouth daily.     potassium chloride SA (K-DUR) 20 MEQ tablet TAKE ONE TABLET BY MOUTH TWICE DAILY 180 tablet 3   rosuvastatin (CRESTOR) 10 MG tablet Take 1 tablet (10 mg total) by mouth daily. 90 tablet 3   saxagliptin HCl (ONGLYZA) 5 MG TABS tablet Take 1 tablet (5 mg total) by mouth daily. (Patient not taking: Reported on 07/29/2022) 90 tablet 2   sitaGLIPtin (JANUVIA) 50 MG tablet Take 1 tablet (50 mg total) by mouth daily. (Patient not taking: Reported on 07/29/2022) 90 tablet 1   No facility-administered medications prior to visit.     Review of Systems:   Constitutional:   No  weight loss, night sweats,  Fevers, chills, fatigue, or  lassitude.  HEENT:   No headaches,  Difficulty swallowing,  Tooth/dental problems, or  Sore throat,                No sneezing, itching, ear ache, nasal congestion, post nasal drip,   CV:  No chest pain,  Orthopnea, PND, swelling in lower extremities, anasarca, dizziness, palpitations, syncope.   GI  No heartburn, indigestion, abdominal pain, nausea, vomiting, diarrhea, change in bowel habits, loss of appetite, bloody stools.   Resp: No shortness of breath with exertion or at rest.  No excess mucus, no productive cough,  No non-productive cough,  No coughing up of blood.  No change in color of mucus.  No wheezing.  No chest wall deformity  Skin: no rash or lesions.  GU: no dysuria, change in color of urine, no urgency or frequency.  No flank pain, no hematuria   MS:  No joint pain or swelling.  No decreased range of motion.  No back pain.    Physical Exam  BP 120/80 (BP Location: Left Arm, Patient Position: Sitting, Cuff Size: Normal)   Pulse 72   Temp 98.1 F (36.7 C) (Oral)   Ht 5' 6.5" (1.689 m)   Wt 191 lb 6.4 oz (86.8 kg)   LMP 12/04/2013   SpO2 97%   BMI 30.43 kg/m   GEN: A/Ox3; pleasant , NAD, well nourished    HEENT:  [place]/AT,   NOSE-clear,  THROAT-clear, no lesions, no postnasal drip or exudate noted.   NECK:  Supple w/ fair ROM; no JVD; normal carotid impulses w/o bruits; no thyromegaly or nodules palpated; no lymphadenopathy.    RESP  Clear  P & A; w/o, wheezes/ rales/ or rhonchi. no accessory muscle use, no dullness to percussion  CARD:  RRR, no m/r/g, no peripheral edema, pulses intact, no cyanosis or clubbing.  GI:   Soft & nt; nml bowel sounds; no organomegaly or masses detected.   Musco: Warm bil, no deformities or joint swelling noted.   Neuro: alert, no focal deficits noted.  Skin: Warm, no lesions or rashes    Lab Results:  CBC   BNP No results found for: "BNP"  ProBNP No results found for: "PROBNP"  Imaging: No results found.        No data to display          No results found for: "NITRICOXIDE"      Assessment & Plan:   OSA (obstructive sleep apnea) Excellent control and compliance on nocturnal CPAP  Plan  Patient Instructions  Continue on CPAP at bedtime Keep up the good work Work on healthy weight Do not drive sleeping Follow-up in 1 year with Dr. Ander Slade and As needed      Obesity Healthy weight loss discussed      Rexene Edison, NP 09/06/2022

## 2022-09-06 NOTE — Assessment & Plan Note (Signed)
Healthy weight loss discussed 

## 2022-09-07 NOTE — Progress Notes (Signed)
Annual Wellness Visit    Patient Care Team: Garima Chronis, Cresenciano Lick, MD as PCP - General (Internal Medicine) Karin Golden, MD as Referring Physician (Dermatology) Eppie Gibson, MD as Attending Physician (Radiation Oncology) Leota Sauers, RN (Inactive) as Oncology Nurse Navigator  Visit Date: 09/13/22   Chief Complaint  Patient presents with   Medicare Wellness   Annual Exam    Subjective:   Patient: Maria Mcpherson, Female    DOB: [birth year], 67 y.o.   MRN: [id number]  Maria Mcpherson is a 67 y.o. Female who presents today for her Annual Wellness Visit. She has a history of arthritis, prediabetes, fibrocystic breast disease, heart murmur, nonmelanoma skin cancer, hypertension, blood clots, hyperlipidemia, hypokalemia, pulmonary embolism 2006, sleep apnea, edema.  Reports no lingering symptoms from pneumonia infection around Christmas, 2023.  History of Type 2 diabetes mellitus treated with Glucophage 1000 mg once daily with a meal. Was experiencing dyspepsia while on 500 mg twice daily. Last HGBA1c at 6.1 on 09/08/22.  History of hyperlipidemia treated with Crestor 10 mg daily. Lipid panel normal on 09/08/22.  History of hypertension treated with Cozaar 100 mg daily, Norvasc 2.5 mg daily, Lopressor 50 mg daily.  History of edema treated with Lasix 40 mg daily. Denies leg swelling.  History of hypokalemia treated with K-Dur 20 meq twice daily.  History of sleep apnea. Uses CPAP machine. Had normal sleep study last week.  Does not see a gynecologist.  Mammogram last completed 10/29/21. Asymmetry in right breast is indeterminate. Spot CC Tomosynthesis and lateromedial vies are recommended. Additional mammography ordered, not scheduled.  Colonoscopy last completed 07/02/21. Results showed examined portion of ileum was normal. Diverticulosis in entire examined colon. Four 3-6 mm polyps in rectum, transverse colon and cecum, removed. Non-bleeding internal hemorrhoids. Recommended  repeat in 2025.  DEXA scan last completed 03/31/16. T-score at -0.9, normal.   Past Medical History:  Diagnosis Date   Arthritis    Bilateral swelling of legs and ankles    Borderline diabetes    Fibrocystic breast disease    Heart murmur    very slight   History of nonmelanoma skin cancer    HTN (hypertension)    Hx of blood clots    Hyperlipidemia    Hypokalemia    Prediabetes    pulmonary emboli 04/2005   same time as oophrectomy/myomectomy, evlauation was negative   Skin cancer    Sleep apnea    CPAP     Family History  Problem Relation Age of Onset   Stomach cancer Mother    Breast cancer Mother 12       mid 4s   Hypertension Mother    Hyperlipidemia Mother    Cancer Mother    Leukemia Father    Stroke Maternal Grandfather    Colon cancer Neg Hx    Esophageal cancer Neg Hx      Social History   Social History Narrative   Not on file     Review of Systems  Constitutional:  Negative for chills, fever, malaise/fatigue and weight loss.  HENT:  Negative for hearing loss, sinus pain and sore throat.   Respiratory:  Negative for cough and hemoptysis.   Cardiovascular:  Negative for chest pain, palpitations, leg swelling and PND.  Gastrointestinal:  Negative for abdominal pain, constipation, diarrhea, heartburn, nausea and vomiting.  Genitourinary:  Negative for dysuria, frequency and urgency.  Musculoskeletal:  Negative for back pain, myalgias and neck pain.  Skin:  Negative for itching and  rash.  Neurological:  Negative for dizziness, tingling, seizures and headaches.  Endo/Heme/Allergies:  Negative for polydipsia.  Psychiatric/Behavioral:  Negative for depression. The patient is not nervous/anxious.       Objective:   Vitals: BP 128/84   Pulse 76   Temp 98.5 F (36.9 C) (Tympanic)   Ht 5' 6.25" (1.683 m)   Wt 191 lb (86.6 kg)   LMP 12/04/2013   SpO2 98%   BMI 30.60 kg/m   Physical Exam Vitals and nursing note reviewed.  Constitutional:       General: She is not in acute distress.    Appearance: Normal appearance. She is not ill-appearing or toxic-appearing.  HENT:     Head: Normocephalic and atraumatic.     Right Ear: Hearing, tympanic membrane, ear canal and external ear normal.     Left Ear: Hearing, tympanic membrane, ear canal and external ear normal.     Mouth/Throat:     Pharynx: Oropharynx is clear.  Eyes:     Extraocular Movements: Extraocular movements intact.     Pupils: Pupils are equal, round, and reactive to light.  Neck:     Thyroid: No thyroid mass, thyromegaly or thyroid tenderness.     Vascular: No carotid bruit.  Cardiovascular:     Rate and Rhythm: Normal rate and regular rhythm. No extrasystoles are present.    Pulses: Normal pulses.          Dorsalis pedis pulses are 2+ on the right side and 2+ on the left side.       Posterior tibial pulses are 2+ on the right side and 2+ on the left side.     Heart sounds: Normal heart sounds. No murmur heard.    No friction rub. No gallop.     Comments: Trace lower extremity edema bilaterally. Pulmonary:     Effort: Pulmonary effort is normal.     Breath sounds: Normal breath sounds. No decreased breath sounds, wheezing, rhonchi or rales.  Chest:     Chest wall: No mass.  Abdominal:     Palpations: Abdomen is soft. There is no hepatomegaly, splenomegaly or mass.     Tenderness: There is no abdominal tenderness.     Hernia: No hernia is present.  Musculoskeletal:     Cervical back: Normal range of motion.     Right lower leg: Edema present.     Left lower leg: Edema present.  Lymphadenopathy:     Cervical: No cervical adenopathy.     Upper Body:     Right upper body: No supraclavicular adenopathy.     Left upper body: No supraclavicular adenopathy.  Skin:    General: Skin is warm and dry.  Neurological:     General: No focal deficit present.     Mental Status: She is alert and oriented to person, place, and time. Mental status is at baseline.      Sensory: Sensation is intact.     Motor: Motor function is intact. No weakness.     Deep Tendon Reflexes: Reflexes are normal and symmetric.  Psychiatric:        Attention and Perception: Attention normal.        Mood and Affect: Mood normal.        Speech: Speech normal.        Behavior: Behavior normal.        Thought Content: Thought content normal.        Cognition and Memory: Cognition normal.  Judgment: Judgment normal.      Most recent functional status assessment:    09/13/2022   11:21 AM  In your present state of health, do you have any difficulty performing the following activities:  Hearing? 0  Vision? 0  Difficulty concentrating or making decisions? 0  Walking or climbing stairs? 0  Dressing or bathing? 0  Doing errands, shopping? 0  Preparing Food and eating ? N  Using the Toilet? N  In the past six months, have you accidently leaked urine? N  Do you have problems with loss of bowel control? N  Managing your Medications? N  Managing your Finances? N  Housekeeping or managing your Housekeeping? N   Most recent fall risk assessment:    09/13/2022   11:21 AM  Fall Risk   Falls in the past year? 0  Number falls in past yr: 0  Injury with Fall? 0  Risk for fall due to : No Fall Risks  Follow up Falls prevention discussed    Most recent depression screenings:    09/13/2022   11:21 AM 07/29/2022   12:03 PM  PHQ 2/9 Scores  PHQ - 2 Score 0 0   Most recent cognitive screening:    09/13/2022   11:22 AM  6CIT Screen  What Year? 0 points  What month? 0 points  What time? 0 points  Count back from 20 0 points  Months in reverse 0 points  Repeat phrase 0 points  Total Score 0 points     Results:   Studies obtained and personally reviewed by me:  Mammogram last completed 10/29/21. Asymmetry in right breast is indeterminate. Spot CC Tomosynthesis and lateromedial vies are recommended. Additional mammography ordered, not scheduled.  Colonoscopy last  completed 07/02/21. Results showed examined portion of ileum was normal. Diverticulosis in entire examined colon. Four 3-6 mm polyps in rectum, transverse colon and cecum, removed. Non-bleeding internal hemorrhoids. Recommended repeat in 2025.  DEXA scan last completed 03/31/16. T-score at -0.9, normal.   Labs:       Component Value Date/Time   NA 144 09/08/2022 1004   K 4.3 09/08/2022 1004   CL 104 09/08/2022 1004   CO2 30 09/08/2022 1004   GLUCOSE 92 09/08/2022 1004   BUN 19 09/08/2022 1004   CREATININE 0.83 09/08/2022 1004   CALCIUM 9.7 09/08/2022 1004   PROT 7.0 09/08/2022 1004   ALBUMIN 4.2 03/23/2017 1056   AST 28 09/08/2022 1004   ALT 25 09/08/2022 1004   ALKPHOS 73 03/23/2017 1056   BILITOT 0.7 09/08/2022 1004   GFRNONAA 78 01/19/2021 1057   GFRAA 90 01/19/2021 1057     Lab Results  Component Value Date   WBC 4.5 09/08/2022   HGB 13.4 09/08/2022   HCT 40.3 09/08/2022   MCV 87.8 09/08/2022   PLT 248 09/08/2022    Lab Results  Component Value Date   CHOL 194 09/08/2022   HDL 75 09/08/2022   LDLCALC 96 09/08/2022   TRIG 124 09/08/2022   CHOLHDL 2.6 09/08/2022    Lab Results  Component Value Date   HGBA1C 6.1 (H) 09/08/2022     Lab Results  Component Value Date   TSH 1.05 09/08/2022    Assessment & Plan:   12/23 Pneumonia Infection: no lingering symptoms from pneumonia infection around Christmas, 2023. Ordered chest Xr to confirm lungs clear.  Type 2 diabetes mellitus: treated with Glucophage 1000 mg once daily with a meal. Was experiencing dyspepsia while on 500 mg twice  daily. Last HGBA1c at 6.1 on 09/08/22. Would like to start Januvia but concerned about whether Medicare will cover it.  Hyperlipidemia: treated with Crestor 10 mg daily. Lipid panel normal on 09/08/22.  Hypertension: treated with Cozaar 100 mg daily, Norvasc 2.5 mg daily, Lopressor 50 mg daily.  Edema: treated with Lasix 40 mg daily.  Hypokalemia: treated with K-Dur 20 meq twice  daily.  Sleep apnea: uses CPAP machine. Had normal sleep study last week.  Mammogram: Last completed 10/29/21. Asymmetry in right breast is indeterminate. Spot CC Tomosynthesis and lateromedial vies are recommended. Additional mammography ordered, not scheduled.  Colonoscopy: Last completed 07/02/21. Results showed examined portion of ileum was normal. Diverticulosis in entire examined colon. Four 3-6 mm polyps in rectum, transverse colon and cecum, removed. Non-bleeding internal hemorrhoids. Recommended repeat in 2025.  DEXA scan: Last completed 03/31/16. T-score at -0.9, normal.  Vaccine Counseling: UTD on flu, tetanus, Covid-19, pneumonia vaccines. Advised to receive shingles vaccine. Will receive RSV vaccine at pharmacy.   Return in 6 months for follow-up.     Annual wellness visit done today including the all of the following: Reviewed patient's Family Medical History Reviewed and updated list of patient's medical providers Assessment of cognitive impairment was done Assessed patient's functional ability Established a written schedule for health screening [place] Completed and Reviewed  Discussed health benefits of physical activity, and encouraged her to engage in regular exercise appropriate for her age and condition.        I,Alexander Ruley,acting as a Education administrator for Elby Showers, MD.,have documented all relevant documentation on the behalf of Elby Showers, MD,as directed by  Elby Showers, MD while in the presence of Elby Showers, MD.   I, Elby Showers, MD, have reviewed all documentation for this visit. The documentation on 09/20/22 for the exam, diagnosis, procedures, and orders are all accurate and complete.

## 2022-09-08 ENCOUNTER — Other Ambulatory Visit: Payer: Medicare Other

## 2022-09-08 DIAGNOSIS — E782 Mixed hyperlipidemia: Secondary | ICD-10-CM

## 2022-09-08 DIAGNOSIS — I10 Essential (primary) hypertension: Secondary | ICD-10-CM

## 2022-09-08 DIAGNOSIS — R5383 Other fatigue: Secondary | ICD-10-CM

## 2022-09-08 DIAGNOSIS — R7302 Impaired glucose tolerance (oral): Secondary | ICD-10-CM

## 2022-09-10 LAB — COMPLETE METABOLIC PANEL WITH GFR
AG Ratio: 1.5 (calc) (ref 1.0–2.5)
ALT: 25 U/L (ref 6–29)
AST: 28 U/L (ref 10–35)
Albumin: 4.2 g/dL (ref 3.6–5.1)
Alkaline phosphatase (APISO): 56 U/L (ref 37–153)
BUN: 19 mg/dL (ref 7–25)
CO2: 30 mmol/L (ref 20–32)
Calcium: 9.7 mg/dL (ref 8.6–10.4)
Chloride: 104 mmol/L (ref 98–110)
Creat: 0.83 mg/dL (ref 0.50–1.05)
Globulin: 2.8 g/dL (calc) (ref 1.9–3.7)
Glucose, Bld: 92 mg/dL (ref 65–99)
Potassium: 4.3 mmol/L (ref 3.5–5.3)
Sodium: 144 mmol/L (ref 135–146)
Total Bilirubin: 0.7 mg/dL (ref 0.2–1.2)
Total Protein: 7 g/dL (ref 6.1–8.1)
eGFR: 78 mL/min/{1.73_m2} (ref >=60)

## 2022-09-10 LAB — TSH: TSH: 1.05 mIU/L (ref 0.40–4.50)

## 2022-09-10 LAB — LIPID PANEL
Cholesterol: 194 mg/dL (ref <200)
HDL: 75 mg/dL (ref >=50)
LDL Cholesterol (Calc): 96 mg/dL (calc)
Non-HDL Cholesterol (Calc): 119 mg/dL (calc) (ref <130)
Total CHOL/HDL Ratio: 2.6 (calc) (ref <5.0)
Triglycerides: 124 mg/dL (ref <150)

## 2022-09-10 LAB — CBC WITH DIFFERENTIAL/PLATELET
Absolute Monocytes: 378 cells/uL (ref 200–950)
Basophils Absolute: 41 cells/uL (ref 0–200)
Basophils Relative: 0.9 %
Eosinophils Absolute: 198 cells/uL (ref 15–500)
Eosinophils Relative: 4.4 %
HCT: 40.3 % (ref 35.0–45.0)
Hemoglobin: 13.4 g/dL (ref 11.7–15.5)
Lymphs Abs: 1278 cells/uL (ref 850–3900)
MCH: 29.2 pg (ref 27.0–33.0)
MCHC: 33.3 g/dL (ref 32.0–36.0)
MCV: 87.8 fL (ref 80.0–100.0)
MPV: 10.8 fL (ref 7.5–12.5)
Monocytes Relative: 8.4 %
Neutro Abs: 2606 cells/uL (ref 1500–7800)
Neutrophils Relative %: 57.9 %
Platelets: 248 10*3/uL (ref 140–400)
RBC: 4.59 10*6/uL (ref 3.80–5.10)
RDW: 15.2 % — ABNORMAL HIGH (ref 11.0–15.0)
Total Lymphocyte: 28.4 %
WBC: 4.5 10*3/uL (ref 3.8–10.8)

## 2022-09-10 LAB — HEMOGLOBIN A1C
Hgb A1c MFr Bld: 6.1 % of total Hgb — ABNORMAL HIGH (ref <5.7)
Mean Plasma Glucose: 128 mg/dL
eAG (mmol/L): 7.1 mmol/L

## 2022-09-10 LAB — MICROALBUMIN / CREATININE URINE RATIO
Creatinine, Urine: 35 mg/dL (ref 20–275)
Microalb Creat Ratio: 11 mcg/mg creat (ref <30)
Microalb, Ur: 0.4 mg/dL

## 2022-09-13 ENCOUNTER — Telehealth: Payer: Self-pay | Admitting: Internal Medicine

## 2022-09-13 ENCOUNTER — Ambulatory Visit (INDEPENDENT_AMBULATORY_CARE_PROVIDER_SITE_OTHER): Payer: Medicare Other | Admitting: Internal Medicine

## 2022-09-13 ENCOUNTER — Encounter: Payer: Self-pay | Admitting: Internal Medicine

## 2022-09-13 VITALS — BP 128/84 | HR 76 | Temp 98.5°F | Ht 66.25 in | Wt 191.0 lb

## 2022-09-13 DIAGNOSIS — E785 Hyperlipidemia, unspecified: Secondary | ICD-10-CM

## 2022-09-13 DIAGNOSIS — Z6830 Body mass index (BMI) 30.0-30.9, adult: Secondary | ICD-10-CM

## 2022-09-13 DIAGNOSIS — E1169 Type 2 diabetes mellitus with other specified complication: Secondary | ICD-10-CM

## 2022-09-13 DIAGNOSIS — I152 Hypertension secondary to endocrine disorders: Secondary | ICD-10-CM

## 2022-09-13 DIAGNOSIS — E1159 Type 2 diabetes mellitus with other circulatory complications: Secondary | ICD-10-CM | POA: Diagnosis not present

## 2022-09-13 DIAGNOSIS — E782 Mixed hyperlipidemia: Secondary | ICD-10-CM | POA: Diagnosis not present

## 2022-09-13 DIAGNOSIS — G4733 Obstructive sleep apnea (adult) (pediatric): Secondary | ICD-10-CM

## 2022-09-13 DIAGNOSIS — Z0000 Encounter for general adult medical examination without abnormal findings: Secondary | ICD-10-CM | POA: Diagnosis not present

## 2022-09-13 LAB — POCT URINALYSIS DIPSTICK
Bilirubin, UA: NEGATIVE
Blood, UA: NEGATIVE
Glucose, UA: NEGATIVE
Ketones, UA: NEGATIVE
Leukocytes, UA: NEGATIVE
Nitrite, UA: NEGATIVE
Protein, UA: NEGATIVE
Spec Grav, UA: 1.01 (ref 1.010–1.025)
Urobilinogen, UA: 0.2 E.U./dL
pH, UA: 6 (ref 5.0–8.0)

## 2022-09-13 MED ORDER — GLIPIZIDE 5 MG PO TABS: 5.0000 mg | ORAL_TABLET | Freq: Two times a day (BID) | ORAL | 3 refills | Status: DC

## 2022-09-13 NOTE — Telephone Encounter (Signed)
Called and let Maria Mcpherson know that Maria Mcpherson had called in a prescription for below medication for her since the Januvia was so high. She stated she would try that, I did let her know it would not cause her to lose weight like the Januvia might. She is going to try it and I also gave her so information on how to contact [employer] for [employer] and also gave her information for [employer] program, and let her know we could put in a referral for Endocrinology. She stated she knows what she needs to do, she just needs to do it and we will see her in 6 months.

## 2022-09-13 NOTE — Patient Instructions (Signed)
Insurance will not cover Januvia. It is expensive. Try Glipizide 5 mg by mouth before breakfast and supper. Continue Metformin as prescribed.Referral to Endocrine to see if they can get Wegovy/Jardiance approved and affordable. Work on diet and exercise. Vaccines discussed.

## 2022-09-16 ENCOUNTER — Telehealth: Payer: Self-pay

## 2022-09-16 NOTE — Telephone Encounter (Signed)
Patient called asking if she should continue taking metFORMIN (GLUCOPHAGE) 500 MG tablet  while starting glipiZIDE (GLUCOTROL) 5 MG tablet  After asking Dr. Renold Genta patient was informed to take both Rx

## 2022-10-18 LAB — HM MAMMOGRAPHY

## 2022-10-20 ENCOUNTER — Encounter: Payer: Self-pay | Admitting: Internal Medicine

## 2022-10-25 ENCOUNTER — Encounter: Payer: Self-pay | Admitting: Internal Medicine

## 2022-10-26 ENCOUNTER — Telehealth: Payer: Self-pay

## 2022-10-26 NOTE — Transitions of Care (Post Inpatient/ED Visit) (Addendum)
   10/26/2022  Name: Hania Youngkin MRN: [id number] DOB: Jan 13, 1956  Today's TOC FU Call Status: Today's TOC FU Call Status:: Unsuccessul Call (1st Attempt) Unsuccessful Call (1st Attempt) Date: 10/26/22  Attempted to reach the patient regarding the most recent Inpatient/ED visit.  Follow Up Plan: Will attempt to reach patient at a later time/day.  Signature Jillisa Harris D, CMA

## 2022-10-31 ENCOUNTER — Encounter: Payer: Self-pay | Admitting: Internal Medicine

## 2022-10-31 ENCOUNTER — Ambulatory Visit (INDEPENDENT_AMBULATORY_CARE_PROVIDER_SITE_OTHER): Payer: Medicare Other | Admitting: Internal Medicine

## 2022-10-31 VITALS — BP 114/80 | HR 70 | Temp 98.2°F | Ht 66.25 in | Wt 194.4 lb

## 2022-10-31 DIAGNOSIS — I152 Hypertension secondary to endocrine disorders: Secondary | ICD-10-CM

## 2022-10-31 DIAGNOSIS — N200 Calculus of kidney: Secondary | ICD-10-CM | POA: Diagnosis not present

## 2022-10-31 DIAGNOSIS — E1159 Type 2 diabetes mellitus with other circulatory complications: Secondary | ICD-10-CM

## 2022-10-31 LAB — POCT URINALYSIS DIPSTICK
Bilirubin, UA: NEGATIVE
Blood, UA: NEGATIVE
Glucose, UA: NEGATIVE
Leukocytes, UA: NEGATIVE
Nitrite, UA: NEGATIVE
Protein, UA: NEGATIVE
Spec Grav, UA: <=1.005 (ref 1.010–1.025)
Urobilinogen, UA: 0.2 E.U./dL
pH, UA: 7.5 (ref 5.0–8.0)

## 2022-10-31 LAB — COMPLETE METABOLIC PANEL WITH GFR
AG Ratio: 1.5 (calc) (ref 1.0–2.5)
ALT: 33 U/L — ABNORMAL HIGH (ref 6–29)
AST: 31 U/L (ref 10–35)
Albumin: 4.3 g/dL (ref 3.6–5.1)
Alkaline phosphatase (APISO): 61 U/L (ref 37–153)
BUN: 21 mg/dL (ref 7–25)
CO2: 32 mmol/L (ref 20–32)
Calcium: 10.4 mg/dL (ref 8.6–10.4)
Chloride: 102 mmol/L (ref 98–110)
Creat: 0.98 mg/dL (ref 0.50–1.05)
Globulin: 2.8 g/dL (calc) (ref 1.9–3.7)
Glucose, Bld: 88 mg/dL (ref 65–99)
Potassium: 4.4 mmol/L (ref 3.5–5.3)
Sodium: 144 mmol/L (ref 135–146)
Total Bilirubin: 0.4 mg/dL (ref 0.2–1.2)
Total Protein: 7.1 g/dL (ref 6.1–8.1)
eGFR: 64 mL/min/{1.73_m2} (ref >=60)

## 2022-10-31 LAB — CBC WITH DIFFERENTIAL/PLATELET
Absolute Monocytes: 495 cells/uL (ref 200–950)
Basophils Absolute: 30 cells/uL (ref 0–200)
Basophils Relative: 0.6 %
Eosinophils Absolute: 230 cells/uL (ref 15–500)
Eosinophils Relative: 4.6 %
HCT: 40.2 % (ref 35.0–45.0)
Hemoglobin: 13.4 g/dL (ref 11.7–15.5)
Lymphs Abs: 1310 cells/uL (ref 850–3900)
MCH: 29.1 pg (ref 27.0–33.0)
MCHC: 33.3 g/dL (ref 32.0–36.0)
MCV: 87.4 fL (ref 80.0–100.0)
MPV: 10.8 fL (ref 7.5–12.5)
Monocytes Relative: 9.9 %
Neutro Abs: 2935 cells/uL (ref 1500–7800)
Neutrophils Relative %: 58.7 %
Platelets: 284 10*3/uL (ref 140–400)
RBC: 4.6 10*6/uL (ref 3.80–5.10)
RDW: 13.4 % (ref 11.0–15.0)
Total Lymphocyte: 26.2 %
WBC: 5 10*3/uL (ref 3.8–10.8)

## 2022-10-31 NOTE — Progress Notes (Addendum)
Patient Care Team: Margaree MackintoshBaxley, Edyn Popoca J, MD as PCP - General (Internal Medicine) Glennis BrinkMitkov, Mario, MD as Referring Physician (Dermatology) Lonie PeakSquire, Sarah, MD as Attending Physician (Radiation Oncology) Barrie Folkiehl, Richard A, RN (Inactive) as Oncology Nurse Navigator  Visit Date: 10/31/22  Subjective:    Patient ID: Maria FleetingBarbara Mcpherson , Female   DOB: [birth year], 67 y.o.    MRN: [id number]   67 y.o. Female presents today for Emergency Dept. follow-up. Patient has a past medical history of arthritis, legs and ankles edema, prediabetes, fibrocystic breast disease, heart murmur, hypertension, hyperlipidemia, hypokalemia, pulmonary embolism 2006, skin cancer, sleep apnea with CPAP.  Seen in ED in [facility] on  10/24/22 for left flank pain, nausea, increased urinary frequency. CT scan showed kidney stone left side, 1.4 cm lesion on right kidney. Creatinine was 1.5 and GFR 39. Given Flomax. Lasix decreased to 20 mg. Feeling better now but has had some flank pain as of one hour ago. Does not drink large amounts of cola, tea. Having difficulty getting urology appointment. She is compliant with Flomax 0.4 mg daily. Does not have a urine strainer. Has not seen or felt anything pass in urine. Blood pressure normal today at 114/80. Denies dysuria, nausea, vomiting.  Past Medical History:  Diagnosis Date   Arthritis    Bilateral swelling of legs and ankles    Borderline diabetes    Fibrocystic breast disease    Heart murmur    very slight   History of nonmelanoma skin cancer    HTN (hypertension)    Hx of blood clots    Hyperlipidemia    Hypokalemia    Prediabetes    pulmonary emboli 04/2005   same time as oophrectomy/myomectomy, evlauation was negative   Skin cancer    Sleep apnea    CPAP     Family History  Problem Relation Age of Onset   Stomach cancer Mother    Breast cancer Mother 5150       mid 5250s   Hypertension Mother    Hyperlipidemia Mother    Cancer Mother    Leukemia Father    Stroke  Maternal Grandfather    Colon cancer Neg Hx    Esophageal cancer Neg Hx     Social History   Social History Narrative   Not on file      Review of Systems  Constitutional:  Negative for fever and malaise/fatigue.  HENT:  Negative for congestion.   Eyes:  Negative for blurred vision.  Respiratory:  Negative for cough and shortness of breath.   Cardiovascular:  Negative for chest pain, palpitations and leg swelling.  Gastrointestinal:  Negative for nausea and vomiting.  Genitourinary:  Positive for flank pain. Negative for dysuria, frequency, hematuria and urgency.  Musculoskeletal:  Negative for back pain.  Skin:  Negative for rash.  Neurological:  Negative for loss of consciousness and headaches.        Objective:   Vitals: BP 114/80   Pulse 70   Temp 98.2 F (36.8 C) (Tympanic)   Ht 5' 6.25" (1.683 m)   Wt 194 lb 6.4 oz (88.2 kg)   LMP 12/04/2013   SpO2 98%   BMI 31.14 kg/m    Physical Exam Vitals and nursing note reviewed.  Constitutional:      General: She is not in acute distress.    Appearance: Normal appearance. She is not toxic-appearing.  HENT:     Head: Normocephalic and atraumatic.  Pulmonary:     Effort: Pulmonary  effort is normal.  Skin:    General: Skin is warm and dry.  Neurological:     Mental Status: She is alert and oriented to person, place, and time. Mental status is at baseline.  Psychiatric:        Mood and Affect: Mood normal.        Behavior: Behavior normal.        Thought Content: Thought content normal.        Judgment: Judgment normal.       Results:   Studies obtained and personally reviewed by me:   Labs:       Component Value Date/Time   NA 144 09/08/2022 1004   K 4.3 09/08/2022 1004   CL 104 09/08/2022 1004   CO2 30 09/08/2022 1004   GLUCOSE 92 09/08/2022 1004   BUN 19 09/08/2022 1004   CREATININE 0.83 09/08/2022 1004   CALCIUM 9.7 09/08/2022 1004   PROT 7.0 09/08/2022 1004   ALBUMIN 4.2 03/23/2017 1056    AST 28 09/08/2022 1004   ALT 25 09/08/2022 1004   ALKPHOS 73 03/23/2017 1056   BILITOT 0.7 09/08/2022 1004   GFRNONAA 78 01/19/2021 1057   GFRAA 90 01/19/2021 1057     Lab Results  Component Value Date   WBC 4.5 09/08/2022   HGB 13.4 09/08/2022   HCT 40.3 09/08/2022   MCV 87.8 09/08/2022   PLT 248 09/08/2022    Lab Results  Component Value Date   CHOL 194 09/08/2022   HDL 75 09/08/2022   LDLCALC 96 09/08/2022   TRIG 124 09/08/2022   CHOLHDL 2.6 09/08/2022    Lab Results  Component Value Date   HGBA1C 6.1 (H) 09/08/2022     Lab Results  Component Value Date   TSH 1.05 09/08/2022      Assessment & Plan:   Iverson Alamin, MD, have reviewed all documentation for this visit. The documentation on 11/12/22 for the exam, diagnosis, procedures, and orders are all accurate and complete.    I,Alexander Ruley,acting as a Neurosurgeon for Margaree Mackintosh, MD.,have documented all relevant documentation on the behalf of Margaree Mackintosh, MD,as directed by  Margaree Mackintosh, MD while in the presence of Margaree Mackintosh, MD.   I, Margaree Mackintosh, MD, have reviewed all documentation for this visit. The documentation on 11/12/22 for the exam, diagnosis, procedures, and orders are all accurate and complete.  Left kidney stone with mild hydronephrosis-has urology follow-up in the near future.  Currently has been on Flomax which was prescribed during recent Emergency Department visit  Impaired glucose tolerance treated with metformin  Dependent edema treated with Lasix which also treats hypertension.  She takes potassium supplementation.  Creatinine at time of evaluation was 1.5 and previous creatinines have been  less than 1.0.  Creatinine today is 0.86.  Essential hypertension treated with amlodipine and losartan as well as metoprolol  Hyperlipidemia treated with Crestor  CBC is stable.  Creatinine is 0.98.  Electrolytes are normal.  Repeat urine dipstick shows no evidence of occult  blood.  Plan: Keep urology follow-up appointment with [facility].  Currently labs are stable and no change in medication at this time.    Addendum: She was seen at [facility] clinic in [place] on April 16  Patient was advised  to stay well-hydrated with water, watching salt intake, counseled regarding uric acid containing foods.  Counseled to take a probiotic and limiting high oxalate containing foods.  Recommended increasing citrate  in diet. Is to have kidney CT scan with renal mass protocol and follow up with Urology in 8-10 months.Is to continue Flomax.  She apparently may have passed a stone on the evening of April 8.    She has additional calculi noted in left kidney on CT.  There was some concern about a cyst or mass in the right kidney at 1.4 cm with follow-up recommended by CT with renal mass protocol when she was seen in the Emergency dept.

## 2022-11-12 DIAGNOSIS — N200 Calculus of kidney: Secondary | ICD-10-CM | POA: Insufficient documentation

## 2022-11-12 NOTE — Patient Instructions (Addendum)
Repeat urine dipstick today shows no evidence of occult blood.  CBC is stable and creatinine is 0.98  Keep follow-up with ointment with Urology clinic.  May need further imaging with CT renal mass protocol regarding some concern regarding a possible mass in the right kidney.  Continue current medications as previously prescribed.  Continue Tamsulosin.

## 2022-11-17 ENCOUNTER — Other Ambulatory Visit: Payer: Self-pay | Admitting: Internal Medicine

## 2022-11-20 ENCOUNTER — Other Ambulatory Visit: Payer: Self-pay | Admitting: Internal Medicine

## 2022-12-11 IMAGING — CR DG CHEST 2V
2 series · 2 of 2 positions shown · non-contrast
Comparison: None.

CLINICAL DATA: chest congestion

EXAM:
CHEST - 2 VIEW

[w chest pa]
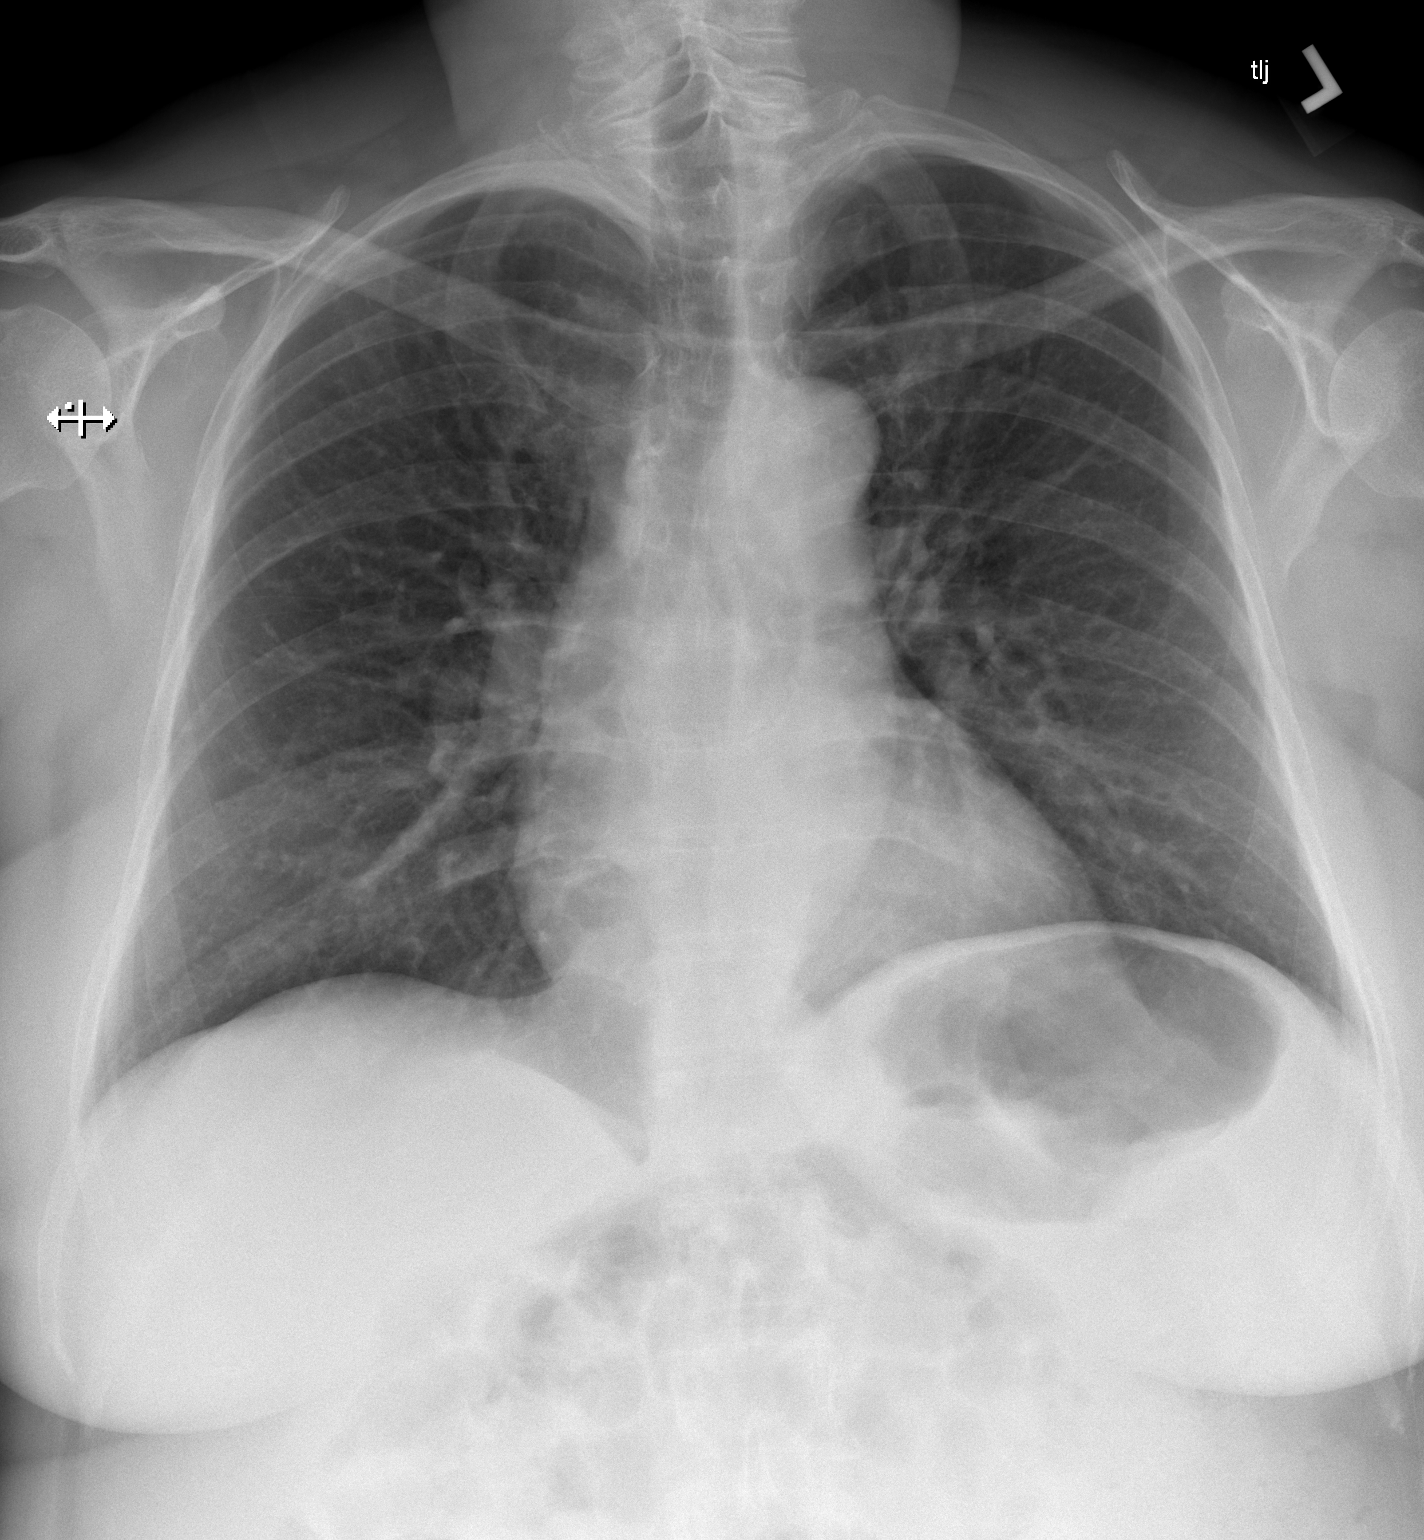

[w chest lat]
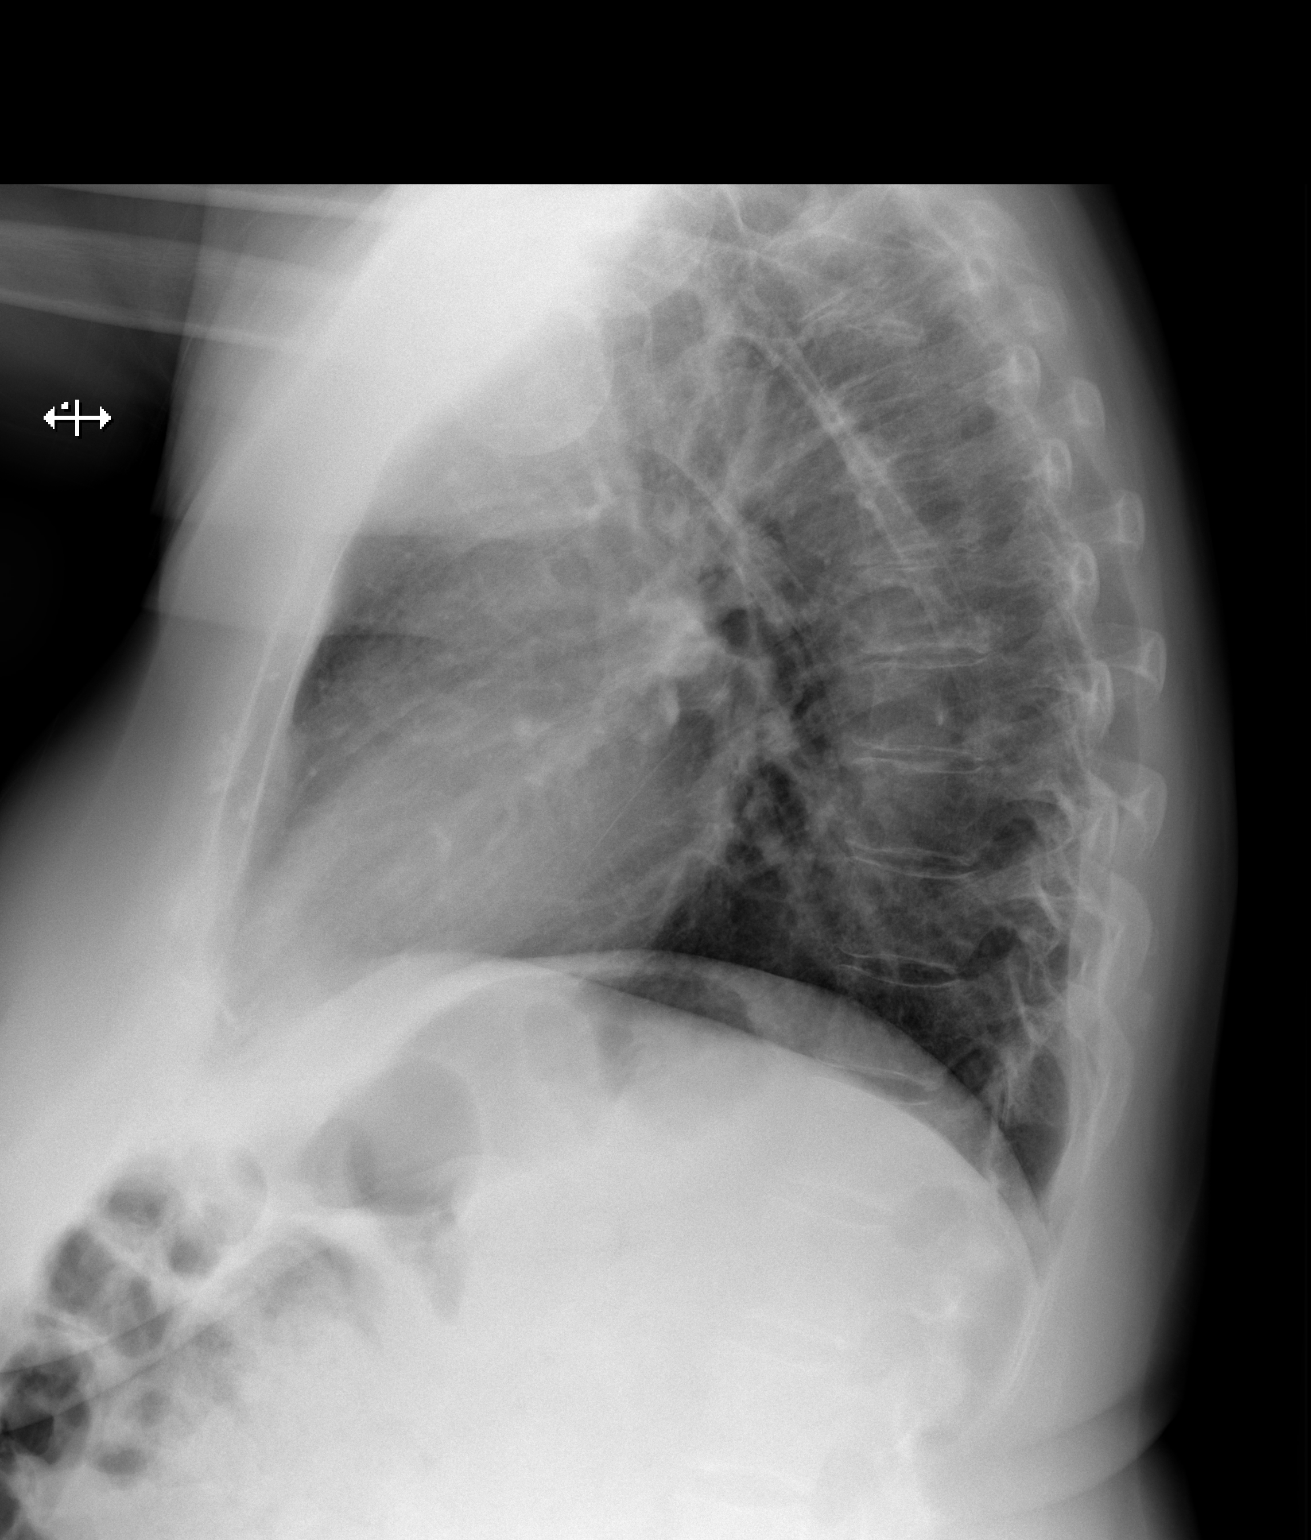

[2 of 2 positions shown; findings below may reference images not displayed]

FINDINGS: No consolidation. No visible pleural effusions or pneumothorax.
Cardiomediastinal silhouette is within normal limits. No evidence of
acute osseous abnormality.
IMPRESSION: No evidence of acute cardiopulmonary disease

## 2023-02-04 IMAGING — CT CT CARDIAC CORONARY ARTERY CALCIUM SCORE
2 of 3 series · 10 of 20 positions shown, 12 images · non-contrast
Comparison: None.

Addendum:
CLINICAL DATA: Cardiovascular Disease Risk stratification

EXAM:
Coronary Calcium Score
MEDICATIONS:
MEDICATIONS
None
TECHNIQUE: A gated, non-contrast computed tomography scan of the heart was
performed using 3mm slice thickness. Axial images were analyzed on a
dedicated workstation. Calcium scoring of the coronary arteries was
performed using the Agatston method.

[Series 3: 2 soft full fov · axial · 0.74mm/px · z∈[-225,-150]mm · 5 of 39 slices shown, 7 images]
[im 7/39  vessel]
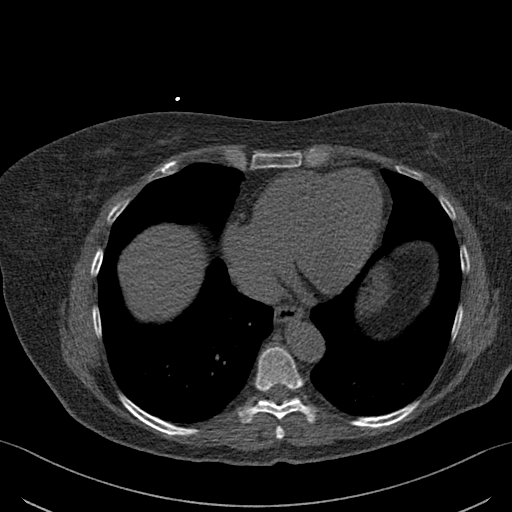
[im 7/39  lung]
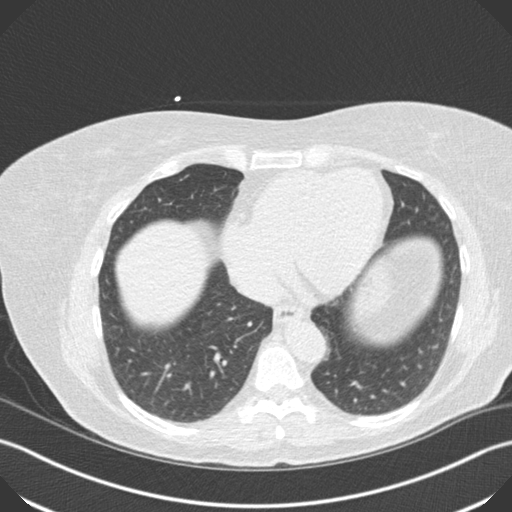
[im 13/39  vessel]
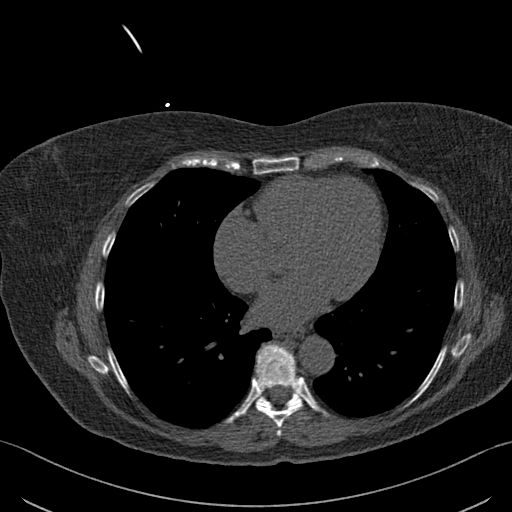
[im 20/39  vessel]
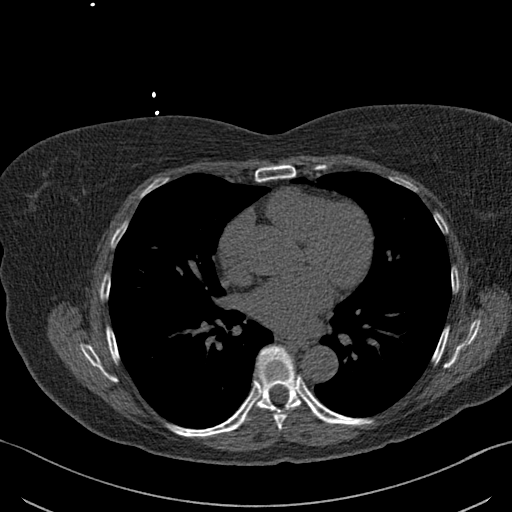
[im 26/39  vessel]
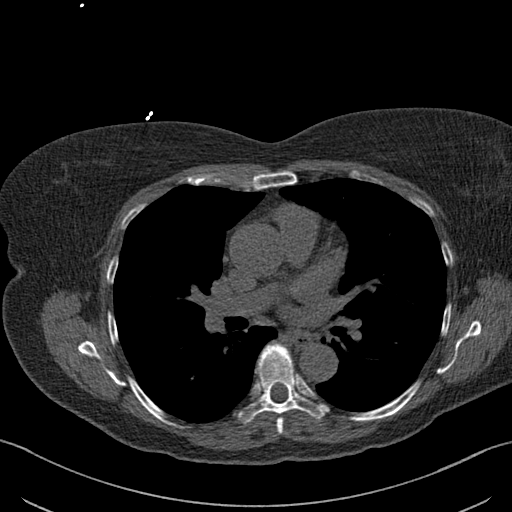
[im 32/39  vessel]
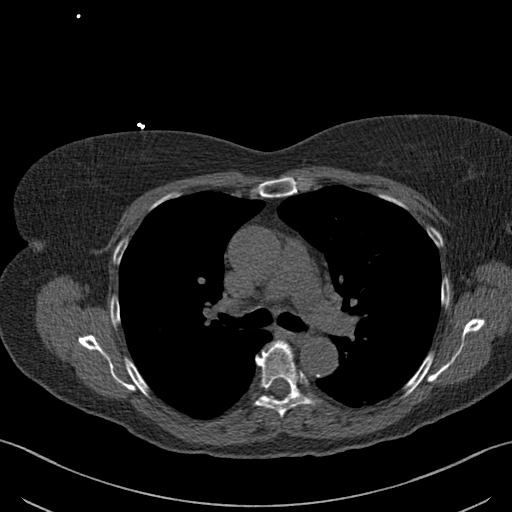
[im 32/39  lung]
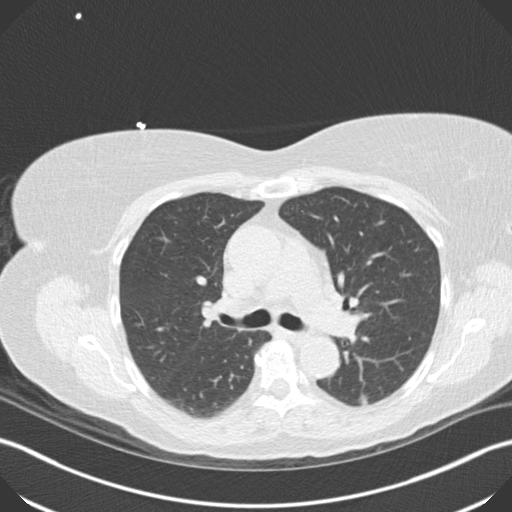

[Series 5: lungs · axial · 0.74mm/px · z∈[-225,-150]mm · 5 of 39 slices shown]
[im 7/39  vessel]
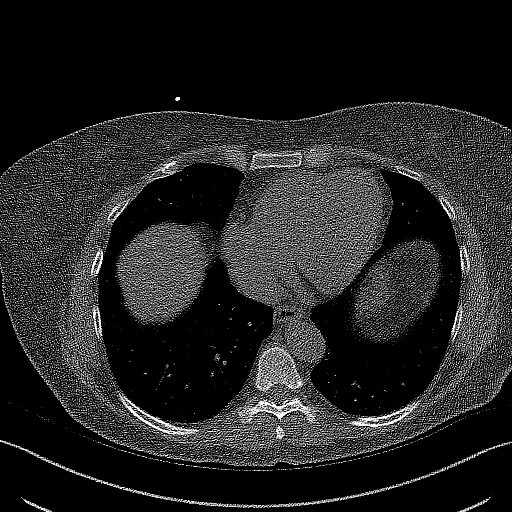
[im 13/39  vessel]
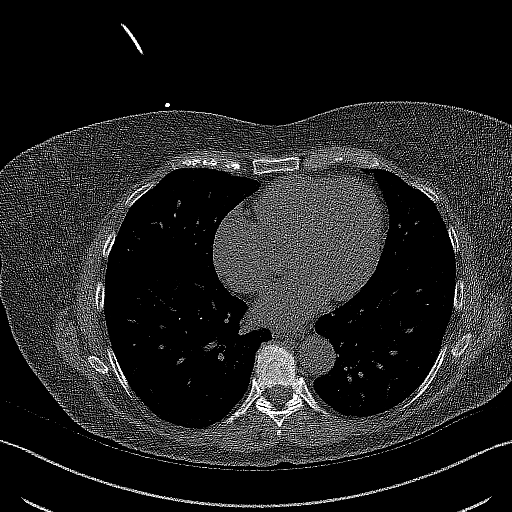
[im 20/39  vessel]
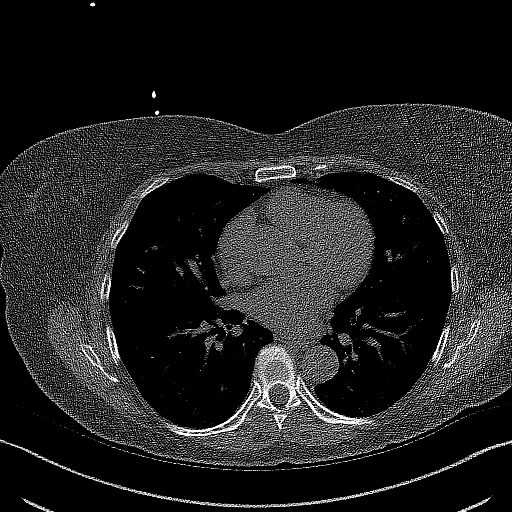
[im 26/39  vessel]
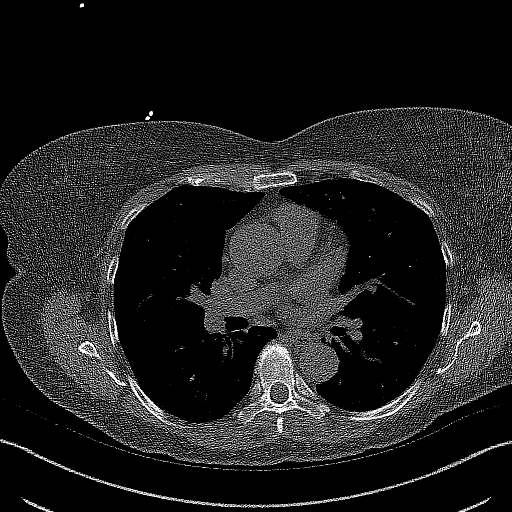
[im 32/39  vessel]
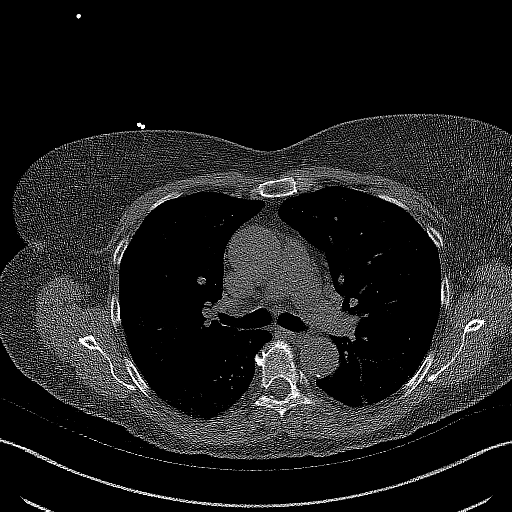

[10 of 20 positions shown; findings below may reference images not displayed]

FINDINGS: Coronary arteries: Normal origins.

Coronary Calcium Score:

Left main: 0

Left anterior descending artery:

Left circumflex artery: 0

Right coronary artery:

Total:

Percentile: 73rd

Pericardium: Normal.

Ascending Aorta: Normal caliber.

Non-cardiac: See separate report from [REDACTED].
IMPRESSION: Coronary calcium score of 39.8 Agatston units. This was 73rd
percentile for age-, race-, and sex-matched controls.



If CAC=0, it is reasonable to withhold statin therapy and reassess
in 5 to 10 years, as long as higher risk conditions are absent
(diabetes mellitus, family history of premature CHD in first degree
relatives (males <55 years; females <65 years), cigarette smoking,
or LDL >=190 mg/dL).

If CAC is 1 to 99, it is reasonable to initiate statin therapy for
patients >=55 years of age.

If CAC is >=100 or >=75th percentile, it is reasonable to initiate
statin therapy at any age.

Cardiology referral should be considered for patients with CAC
scores >=400 or >=75th percentile.

*1903 AHA/ACC/AACVPR/AAPA/ABC/JEHNSY/ERXLEBEN/KERLINE/Daag/SLAVEK/BILLIE/NIZ
Guideline on the Management of Blood Cholesterol: A Report of the
American College of Cardiology/American Heart Association Task Force
on Clinical Practice Guidelines. J Am Coll Cardiol.
8257;73(24):0021-0843.

ADDENDUM:
The following report is an over-read performed by radiologist Dr.
over-read does not include interpretation of cardiac or coronary
anatomy or pathology. The coronary calcium score interpretation by
the cardiologist is attached.
FINDINGS: Vascular: Aortic atherosclerosis. No acute non-cardiac vascular
finding.

Mediastinum/Nodes: No pathologically enlarged mediastinal, or hilar
lymph nodes, noting limited sensitivity for the detection of hilar
adenopathy on this noncontrast study. Visualized portions of the
esophagus are grossly unremarkable

Lungs/Pleura: Posterior pleural adjacent left lower lobe pulmonary
nodules measures 6 mm on image [DATE] and image [DATE]. Within the
visualized portions of the thorax there is no acute consolidative
airspace disease, no pleural effusions and no pneumothorax

Upper Abdomen: Visualized portions of the upper abdomen are
unremarkable.

Musculoskeletal: There are no aggressive appearing lytic or blastic
lesions noted in the visualized portions of the skeleton. Thoracic
spondylosis.
IMPRESSION: 1. Two adjacent left lower lobe pulmonary nodules measuring up to 6
mm. Recommend a non-contrast Chest CT at 3-6 months. If patient is
high risk for malignancy, recommend an additional non-contrast Chest
CT at 18-24 months; if patient is low risk for malignancy a
non-contrast Chest CT at 18-24 months is optional.
These guidelines do not apply to immunocompromised patients and
patients with cancer. Follow up in patients with significant
comorbidities as clinically warranted. For lung cancer screening,
adhere to Lung-RADS guidelines. Reference: Radiology. 2554;
284(1):228-43.
2. aortic Atherosclerosis ([id number].S).

*** End of Addendum ***
FINDINGS: Coronary arteries: Normal origins.

Coronary Calcium Score:

Left main: 0

Left anterior descending artery:

Left circumflex artery: 0

Right coronary artery:

Total:

Percentile: 73rd

Pericardium: Normal.

Ascending Aorta: Normal caliber.

Non-cardiac: See separate report from [REDACTED].
IMPRESSION: Coronary calcium score of 39.8 Agatston units. This was 73rd
percentile for age-, race-, and sex-matched controls.



If CAC=0, it is reasonable to withhold statin therapy and reassess
in 5 to 10 years, as long as higher risk conditions are absent
(diabetes mellitus, family history of premature CHD in first degree
relatives (males <55 years; females <65 years), cigarette smoking,
or LDL >=190 mg/dL).

If CAC is 1 to 99, it is reasonable to initiate statin therapy for
patients >=55 years of age.

If CAC is >=100 or >=75th percentile, it is reasonable to initiate
statin therapy at any age.

Cardiology referral should be considered for patients with CAC
scores >=400 or >=75th percentile.

*1903 AHA/ACC/AACVPR/AAPA/ABC/JEHNSY/ERXLEBEN/KERLINE/Daag/SLAVEK/BILLIE/NIZ
Guideline on the Management of Blood Cholesterol: A Report of the
American College of Cardiology/American Heart Association Task Force
on Clinical Practice Guidelines. J Am Coll Cardiol.
8257;73(24):0021-0843.

## 2023-03-14 ENCOUNTER — Other Ambulatory Visit: Payer: Medicare Other

## 2023-03-16 ENCOUNTER — Ambulatory Visit: Payer: Medicare Other | Admitting: Internal Medicine

## 2023-03-21 ENCOUNTER — Ambulatory Visit: Payer: Medicare Other | Admitting: Internal Medicine

## 2023-03-28 NOTE — Progress Notes (Addendum)
Patient Care Team: Margaree Mackintosh, MD as PCP - General (Internal Medicine) Glennis Brink, MD as Referring Physician (Dermatology) Lonie Peak, MD as Attending Physician (Radiation Oncology) Barrie Folk, RN (Inactive) as Oncology Nurse Navigator  Visit Date: 04/04/23  Subjective:    Patient ID: Maria Mcpherson , Female   DOB: 1955/11/11, 67 y.o.    MRN: [id number]   67 y.o. Female presents today for a 6 month follow-up. History of arthritis, dependent edema,  glucose intolerance,fibrocystic breast disease,  BMI 30.60, hypertension, pulmonary emboli, sleep apnea with CPAP, hyperlipidemia, hypokalemia die to diuretic therapy.  Was seen in ED 10/24/22 with symptoms of left flank pain and nausea. CT scan showed 5mm left proximal obstructing stone with mild hydroureteronephrosis. Seen by [facility] on 11/08/22 for kidney stone that required ED visit in [facility] on 10/24/2022 showing left proximal ureteral stone on CT. Tamsulosin was prescribed. Had CT of kidney at [facility] on April 30th showing no mass but several simple cysts bilaterally as well as several non-obstructing renal calculi along lower poles of both kidneys. No hydronephrosis.Previous obstructing calculus was no longer seen.Pt advised to watch salt intake and increase fluid intake as well as decreasing foods containing oxalates.  History of Type 2 diabetes mellitus treated with metformin 500 mg twice daily with meal, glipizide 5 mg twice daily before meal. HGBA1c at 6.0% on 03/30/23, down from 6.1% on 09/08/22. She is having difficulty losing weight. She has lost one pound since 06/02/22. Used to go to [employer] here in [place].  History of hypertension treated with losartan 100 mg daily, metoprolol tartrate 50 mg daily. Blood pressure normal today at 120/80.  History of hyperlipidemia treated with rosuvastatin 10 mg daily. TRIG elevated at 181. Liver functions normal. Coronary calcium score  of 39.8 Agatston units on 11/24/21 CT. Also found were two adjacent left lower lobe pulmonary nodules measuring up to 6 mm.  History of dependent edema treated with furosemide 40 mg daily.     Past Medical History:  Diagnosis Date   Arthritis    Bilateral swelling of legs and ankles    Borderline diabetes    Fibrocystic breast disease    Heart murmur    very slight   History of nonmelanoma skin cancer    HTN (hypertension)    Hx of blood clots    Hyperlipidemia    Hypokalemia    Prediabetes    pulmonary emboli 04/2005   same time as oophrectomy/myomectomy, evlauation was negative   Skin cancer    Sleep apnea    CPAP     Family History  Problem Relation Age of Onset   Stomach cancer Mother    Breast cancer Mother 3       mid 15s   Hypertension Mother    Hyperlipidemia Mother    Cancer Mother    Leukemia Father    Stroke Maternal Grandfather    Colon cancer Neg Hx    Esophageal cancer Neg Hx     Social Hx: Married, lives in [place], [place]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  HENT:  Negative for congestion.   Eyes:  Negative for blurred vision.  Respiratory:  Negative for cough and shortness of breath.   Cardiovascular:  Negative for chest pain, palpitations and leg swelling.  Gastrointestinal:  Negative for vomiting.  Musculoskeletal:  Positive for joint  pain. Negative for back pain.  Skin:  Negative for rash.  Neurological:  Negative for loss of consciousness and headaches.        Objective:   Vitals: BP 120/80   Pulse 66   Ht 5' 6.25" (1.683 m)   Wt 191 lb (86.6 kg)   LMP 12/04/2013   SpO2 96%   BMI 30.60 kg/m    Physical Exam Vitals and nursing note reviewed.  Constitutional:      General: She is not in acute distress.    Appearance: Normal appearance. She is not toxic-appearing.  HENT:     Head: Normocephalic and atraumatic.     Right Ear: Hearing, tympanic  membrane, ear canal and external ear normal.     Left Ear: Hearing, tympanic membrane, ear canal and external ear normal.     Mouth/Throat:     Pharynx: Oropharynx is clear. No oropharyngeal exudate.  Neck:     Thyroid: No thyroid mass or thyroid tenderness.  Cardiovascular:     Rate and Rhythm: Normal rate and regular rhythm. No extrasystoles are present.    Pulses: Normal pulses.     Heart sounds: Normal heart sounds. No murmur heard.    No friction rub. No gallop.  Pulmonary:     Effort: Pulmonary effort is normal. No respiratory distress.     Breath sounds: Normal breath sounds. No wheezing or rales.  Lymphadenopathy:     Cervical: No cervical adenopathy.  Skin:    General: Skin is warm and dry.  Neurological:     Mental Status: She is alert and oriented to person, place, and time. Mental status is at baseline.  Psychiatric:        Mood and Affect: Mood normal.        Behavior: Behavior normal.        Thought Content: Thought content normal.        Judgment: Judgment normal.       Results:   Studies obtained and personally reviewed by me:  Coronary calcium score of 39.8 Agatston units on 11/24/21 CT. Also found were two adjacent left lower lobe pulmonary nodules measuring up to 6 mm.  Labs:       Component Value Date/Time   NA 144 10/31/2022 1223   K 4.4 10/31/2022 1223   CL 102 10/31/2022 1223   CO2 32 10/31/2022 1223   GLUCOSE 88 10/31/2022 1223   BUN 21 10/31/2022 1223   CREATININE 0.98 10/31/2022 1223   CALCIUM 10.4 10/31/2022 1223   PROT 7.3 03/30/2023 0942   ALBUMIN 4.2 03/23/2017 1056   AST 26 03/30/2023 0942   ALT 26 03/30/2023 0942   ALKPHOS 73 03/23/2017 1056   BILITOT 0.8 03/30/2023 0942   GFRNONAA 78 01/19/2021 1057   GFRAA 90 01/19/2021 1057     Lab Results  Component Value Date   WBC 5.0 10/31/2022   HGB 13.4 10/31/2022   HCT 40.2 10/31/2022   MCV 87.4 10/31/2022   PLT 284 10/31/2022    Lab Results  Component Value Date   CHOL 191  03/30/2023   HDL 64 03/30/2023   LDLCALC 99 03/30/2023   TRIG 181 (H) 03/30/2023   CHOLHDL 3.0 03/30/2023    Lab Results  Component Value Date   HGBA1C 6.0 (H) 03/30/2023     Lab Results  Component Value Date   TSH 1.05 09/08/2022      Assessment & Plan:   Type 2 diabetes mellitus: start Januvia 50 mg daily. Stop  glipizide. Continue metformin 500 mg twice daily with meal. HGBA1c at 6.0% on 03/30/23, down from 6.1% on 09/08/22. She is having difficulty losing weight. Referral for Endocrinology. May be candidate for GLP-1 meds such as Ozempic depending on insurance coverage which might help weight loss in additio to controlling glucose. AICs have ranged from 5.7% in 2021 to 6.1% in Feb. 2024  Hypertension: treated with losartan 100 mg daily, metoprolol tartrate 50 mg daily. Blood pressure normal today at 120/80.  Hyperlipidemia: treated with rosuvastatin 10 mg daily. TRIG elevated at 181. Liver functions normal. Watch diet. Continue to exercise.  Dependent edema: treated with furosemide 40 mg daily.   Musculoskeletal pain: taking BC powder as needed.   Hx of sleep apnea  Will order non-contrast CT as suggested by Radiology to follow-up on two pulmonary nodules left lower lobe measuring up to 6 mm in diameter noted on CT cardiac scoring in May 2023. Not due until late August and could even wait until November if not convenient.   We can wait on re checking Hgb AIC as she will not be on Januvia due to expense and is being referred to Endocrinology.  ADDENDUM: Patient sent message through [employer] that Januvia was going to be in excess of $500 with her insurance coverage. Will continue Glipizide for now until patient can see Endocrinology  I,Alexander Ruley,acting as a scribe for Margaree Mackintosh, MD.,have documented all relevant documentation on the behalf of Margaree Mackintosh, MD,as directed by  Margaree Mackintosh, MD while in the presence of Margaree Mackintosh, MD.  I, Margaree Mackintosh, MD, have reviewed  all documentation for this visit. The documentation on 04/04/23 for the exam, diagnosis, procedures, and orders are all accurate and complete.

## 2023-03-30 ENCOUNTER — Other Ambulatory Visit: Payer: Medicare Other

## 2023-03-30 DIAGNOSIS — E1159 Type 2 diabetes mellitus with other circulatory complications: Secondary | ICD-10-CM

## 2023-03-30 DIAGNOSIS — E119 Type 2 diabetes mellitus without complications: Secondary | ICD-10-CM

## 2023-03-30 DIAGNOSIS — E782 Mixed hyperlipidemia: Secondary | ICD-10-CM

## 2023-03-30 DIAGNOSIS — E1169 Type 2 diabetes mellitus with other specified complication: Secondary | ICD-10-CM

## 2023-03-31 LAB — HEPATIC FUNCTION PANEL
AG Ratio: 1.6 (calc) (ref 1.0–2.5)
ALT: 26 U/L (ref 6–29)
AST: 26 U/L (ref 10–35)
Albumin: 4.5 g/dL (ref 3.6–5.1)
Alkaline phosphatase (APISO): 68 U/L (ref 37–153)
Bilirubin, Direct: 0.1 mg/dL (ref 0.0–0.2)
Globulin: 2.8 g/dL (calc) (ref 1.9–3.7)
Indirect Bilirubin: 0.7 mg/dL (ref 0.2–1.2)
Total Bilirubin: 0.8 mg/dL (ref 0.2–1.2)
Total Protein: 7.3 g/dL (ref 6.1–8.1)

## 2023-03-31 LAB — LIPID PANEL
Cholesterol: 191 mg/dL (ref <200)
HDL: 64 mg/dL (ref >=50)
LDL Cholesterol (Calc): 99 mg/dL
Non-HDL Cholesterol (Calc): 127 mg/dL (ref <130)
Total CHOL/HDL Ratio: 3 (calc) (ref <5.0)
Triglycerides: 181 mg/dL — ABNORMAL HIGH (ref <150)

## 2023-03-31 LAB — HEMOGLOBIN A1C
Hgb A1c MFr Bld: 6 %{Hb} — ABNORMAL HIGH (ref <5.7)
Mean Plasma Glucose: 126 mg/dL
eAG (mmol/L): 7 mmol/L

## 2023-04-04 ENCOUNTER — Encounter: Payer: Self-pay | Admitting: Internal Medicine

## 2023-04-04 ENCOUNTER — Ambulatory Visit (INDEPENDENT_AMBULATORY_CARE_PROVIDER_SITE_OTHER): Payer: Medicare Other | Admitting: Internal Medicine

## 2023-04-04 VITALS — BP 120/80 | HR 66 | Ht 66.25 in | Wt 191.0 lb

## 2023-04-04 DIAGNOSIS — E1159 Type 2 diabetes mellitus with other circulatory complications: Secondary | ICD-10-CM | POA: Diagnosis not present

## 2023-04-04 DIAGNOSIS — R918 Other nonspecific abnormal finding of lung field: Secondary | ICD-10-CM

## 2023-04-04 DIAGNOSIS — E1169 Type 2 diabetes mellitus with other specified complication: Secondary | ICD-10-CM

## 2023-04-04 DIAGNOSIS — G4733 Obstructive sleep apnea (adult) (pediatric): Secondary | ICD-10-CM

## 2023-04-04 DIAGNOSIS — E785 Hyperlipidemia, unspecified: Secondary | ICD-10-CM

## 2023-04-04 DIAGNOSIS — I152 Hypertension secondary to endocrine disorders: Secondary | ICD-10-CM

## 2023-04-04 MED ORDER — SITAGLIPTIN PHOSPHATE 50 MG PO TABS
50.0000 mg | ORAL_TABLET | Freq: Every day | ORAL | 0 refills | Status: DC
Start: 2023-04-04 — End: 2023-08-15

## 2023-04-04 NOTE — Patient Instructions (Addendum)
Have ordered noncontrast lung CT for follow-up of multiple pulmonary nodules seen on recent coronary calcium scoring testing.  Pharmacy reported that Januvia would be in excess of $500.  Patient being referred to Endocrinology for evaluation of glucose intolerance/diabetes mellitus.  No further episodes of kidney stones.  Blood pressure is excellent.  Continue to watch diet as triglycerides are elevated at 181.  Increase exercise if possible.

## 2023-04-06 ENCOUNTER — Ambulatory Visit
Admission: RE | Admit: 2023-04-06 | Discharge: 2023-04-06 | Disposition: A | Discharge status: Home / Self Care | Payer: Medicare Other | Source: Ambulatory Visit | Attending: Internal Medicine | Admitting: Internal Medicine

## 2023-04-06 DIAGNOSIS — R918 Other nonspecific abnormal finding of lung field: Secondary | ICD-10-CM

## 2023-04-17 ENCOUNTER — Encounter: Payer: Self-pay | Admitting: Internal Medicine

## 2023-04-17 NOTE — Telephone Encounter (Signed)
I called [facility] and she said they would expedited the results and we would have them shortly.

## 2023-04-20 ENCOUNTER — Other Ambulatory Visit: Payer: Self-pay | Admitting: Internal Medicine

## 2023-05-08 ENCOUNTER — Other Ambulatory Visit: Payer: Self-pay | Admitting: Internal Medicine

## 2023-05-15 DIAGNOSIS — G4733 Obstructive sleep apnea (adult) (pediatric): Secondary | ICD-10-CM

## 2023-05-16 ENCOUNTER — Other Ambulatory Visit: Payer: Medicare Other

## 2023-05-17 NOTE — Telephone Encounter (Signed)
Last CPAP was ordered in 2017. Okay to place an order for a new CPAP?

## 2023-05-18 NOTE — Telephone Encounter (Signed)
Yes that is fine for new CPAP order same settings

## 2023-05-25 ENCOUNTER — Other Ambulatory Visit: Payer: Self-pay | Admitting: Internal Medicine

## 2023-05-29 ENCOUNTER — Ambulatory Visit (INDEPENDENT_AMBULATORY_CARE_PROVIDER_SITE_OTHER): Payer: Medicare Other | Admitting: Orthopaedic Surgery

## 2023-05-29 ENCOUNTER — Other Ambulatory Visit (INDEPENDENT_AMBULATORY_CARE_PROVIDER_SITE_OTHER): Payer: Self-pay

## 2023-05-29 VITALS — Ht 66.25 in | Wt 192.0 lb

## 2023-05-29 DIAGNOSIS — M25551 Pain in right hip: Secondary | ICD-10-CM | POA: Diagnosis not present

## 2023-05-29 DIAGNOSIS — M1611 Unilateral primary osteoarthritis, right hip: Secondary | ICD-10-CM | POA: Insufficient documentation

## 2023-05-29 NOTE — Progress Notes (Signed)
The patient is well-known to me.  She is now 67 years old.  In 2017 we successfully replaced her left hip due to significant left hip arthritis.  With time she has been developing right hip pain off and on for years now.  For the last 3 months has become more of a constant and worsening pain.  She is not ready for hip replacement surgery as of yet.  She does travel to [place] for a big walking trip in a month from now.  She has had no acute change in her medical status.  She is walking with a slight limp.  I was able to review her medications within epic.  She is a diabetic but reports good control.  Her BMI is 30.76.  She is starting to have a problem with ADLs as a relates to her right hip pain.  On exam her left hip moves smoothly and fluidly.  The right hip has no blocks to rotation but significant pain in the groin with internal and external rotation.  An AP pelvis and lateral right hip shows worsening arthritis of the right hip.  There is superior lateral joint space narrowing.  There is osteophytes around the femoral head as well and this is worsened when compared to previous films.  She is more interested right now and having a steroid injection in her right hip sometime before her trip and I think this is an excellent plan for her.  We will set her up for an appointment to see my partner Dr. Shon Baton sometime in 2 weeks for an ultrasound-guided steroid injection in her right hip.  She can then follow-up with [place] as needed.  She understands if things worsen we are happy to see her at any time as a relates to her right hip arthritis.  All questions and concerns were addressed and answered.

## 2023-06-12 ENCOUNTER — Encounter: Payer: Self-pay | Admitting: Sports Medicine

## 2023-06-12 ENCOUNTER — Ambulatory Visit (INDEPENDENT_AMBULATORY_CARE_PROVIDER_SITE_OTHER): Payer: Medicare Other | Admitting: Sports Medicine

## 2023-06-12 ENCOUNTER — Other Ambulatory Visit: Payer: Self-pay

## 2023-06-12 DIAGNOSIS — M25551 Pain in right hip: Secondary | ICD-10-CM | POA: Diagnosis not present

## 2023-06-12 DIAGNOSIS — M1611 Unilateral primary osteoarthritis, right hip: Secondary | ICD-10-CM | POA: Diagnosis not present

## 2023-06-12 MED ORDER — METHYLPREDNISOLONE ACETATE 40 MG/ML IJ SUSP
80.0000 mg | INTRAMUSCULAR | Status: AC | PRN
Start: 2023-06-12 — End: 2023-06-12
  Administered 2023-06-12: 80 mg via INTRA_ARTICULAR

## 2023-06-12 MED ORDER — LIDOCAINE HCL 1 % IJ SOLN
4.0000 mL | INTRAMUSCULAR | Status: AC | PRN
Start: 2023-06-12 — End: 2023-06-12
  Administered 2023-06-12: 4 mL

## 2023-06-12 NOTE — Progress Notes (Signed)
   Procedure Note  Patient: Maria Mcpherson             Date of Birth: [birth year]           MRN: [id number]             Visit Date: 06/12/2023  Procedures: Visit Diagnoses:  1. Unilateral primary osteoarthritis, right hip   2. Pain of right hip    Large Joint Inj: R hip joint on 06/12/2023 8:19 AM Indications: pain Details: 22 G 3.5 in needle, ultrasound-guided anterior approach Medications: 4 mL lidocaine 1 %; 80 mg methylPREDNISolone acetate 40 MG/ML Outcome: tolerated well, no immediate complications  Procedure: US-guided intra-articular hip injection, Right  After discussion on risks/benefits/indications and informed verbal consent was obtained, a timeout was performed. Patient was lying supine on exam table. The hip was cleaned with betadine and alcohol swabs. Then utilizing ultrasound guidance, the patient's femoral head and neck junction was identified and subsequently injected with 4:2 lidocaine:depomedrol via an in-plane approach with ultrasound visualization of the injectate administered into the hip joint. Patient tolerated procedure well without immediate complications.  Procedure, treatment alternatives, risks and benefits explained, specific risks discussed. Consent was given by the patient. Immediately prior to procedure a time out was called to verify the correct patient, procedure, equipment, support staff and site/side marked as required. Patient was prepped and draped in the usual sterile fashion.     - I evaluated the patient about 10 minutes post-injection and she had excellent improvement in pain and range of motion - follow-up with Dr. Magnus Ivan as indicated; I am happy to see them as needed  Madelyn Brunner, DO Primary Care Sports Medicine Physician  [facility] - Orthopedics  This note was dictated using Dragon naturally speaking software and may contain errors in syntax, spelling, or content which have not been identified prior to signing this note.

## 2023-07-14 ENCOUNTER — Other Ambulatory Visit: Payer: Self-pay | Admitting: Internal Medicine

## 2023-08-15 ENCOUNTER — Ambulatory Visit (INDEPENDENT_AMBULATORY_CARE_PROVIDER_SITE_OTHER): Payer: Medicare Other | Admitting: Endocrinology

## 2023-08-15 ENCOUNTER — Other Ambulatory Visit: Payer: Self-pay | Admitting: Endocrinology

## 2023-08-15 ENCOUNTER — Other Ambulatory Visit: Payer: Self-pay | Admitting: Internal Medicine

## 2023-08-15 ENCOUNTER — Encounter: Payer: Self-pay | Admitting: Endocrinology

## 2023-08-15 VITALS — BP 132/80 | HR 78 | Resp 20 | Ht 66.25 in | Wt 197.0 lb

## 2023-08-15 DIAGNOSIS — Z7985 Long-term (current) use of injectable non-insulin antidiabetic drugs: Secondary | ICD-10-CM | POA: Diagnosis not present

## 2023-08-15 DIAGNOSIS — Z7984 Long term (current) use of oral hypoglycemic drugs: Secondary | ICD-10-CM | POA: Diagnosis not present

## 2023-08-15 DIAGNOSIS — E785 Hyperlipidemia, unspecified: Secondary | ICD-10-CM | POA: Diagnosis not present

## 2023-08-15 DIAGNOSIS — E119 Type 2 diabetes mellitus without complications: Secondary | ICD-10-CM | POA: Diagnosis not present

## 2023-08-15 DIAGNOSIS — R7302 Impaired glucose tolerance (oral): Secondary | ICD-10-CM

## 2023-08-15 LAB — POCT GLYCOSYLATED HEMOGLOBIN (HGB A1C): Hemoglobin A1C: 5.7 % — AB (ref 4.0–5.6)

## 2023-08-15 MED ORDER — LANCET DEVICE MISC: 1.0000 | Freq: Three times a day (TID) | 0 refills | Status: AC

## 2023-08-15 MED ORDER — SEMAGLUTIDE(0.25 OR 0.5MG/DOS) 2 MG/3ML ~~LOC~~ SOPN: PEN_INJECTOR | SUBCUTANEOUS | 4 refills | Status: DC

## 2023-08-15 MED ORDER — BLOOD GLUCOSE TEST VI STRP: 1.0000 | ORAL_STRIP | Freq: Every day | 3 refills | Status: AC

## 2023-08-15 MED ORDER — BLOOD GLUCOSE MONITORING SUPPL DEVI: 1.0000 | Freq: Three times a day (TID) | 0 refills | Status: DC

## 2023-08-15 MED ORDER — LANCETS MISC. MISC: 1.0000 | Freq: Every day | 3 refills | Status: DC

## 2023-08-15 NOTE — Patient Instructions (Addendum)
Diabetes regimen:  Continue metformin 500 mg two times a day, better to take with meals.  Start ozempic 0.25 mg weekly for 4 weeks and increase to 0. 5mg   weekly.  After you be on 0.5 mg dose, stop glipizide.     Latest Reference Range & Units 03/30/23 09:42 08/15/23 15:04  Hemoglobin A1C 4.0 - 5.6 % 6.0 (H) 5.7 !  (H): Data is abnormally high !: Data is abnormal   Referred to dietician.

## 2023-08-15 NOTE — Progress Notes (Unsigned)
Outpatient Endocrinology Note Maria Blythe Veach, MD   Patient's Name: Maria Mcpherson    DOB: 07/02/1956    MRN: [id number]                                                    REASON OF VISIT: New consult for type 2 diabetes mellitus  REFERRING PROVIDER: Margaree Mackintosh, MD  PCP: Margaree Mackintosh, MD  HISTORY OF PRESENT ILLNESS:   Maria Mcpherson is a 68 y.o. old female with past medical history listed below, is here for new consult for type 2 diabetes mellitus.   Pertinent Diabetes History: Patient has prediabetes diagnosed in 2012, she was started on metformin in 2016, she was being managed as type 2 diabetes mellitus with with metformin and glipizide.  She had monitoring for chronic complications as listed below of diabetes in the past.  Januvia was planned in October 2024 however was not cost effective.  Hemoglobin A1c mostly in the range of 5.6 to 6.2% range.  She has remained controlled on 2 antidiabetic medications.  Although her hemoglobin A1c in the range of 6% range , she has been able to maintain this with 2 antidiabetic medications, we will treat her as controlled type 2 diabetes mellitus.    History of DKA or diabetes related hospitalizations: none  Previous diabetes education: no  Family h/o diabetes mellitus: none   No personal history of pancreatitis and / or family history of medullary thyroid carcinoma or MEN 2B syndrome.   Chronic Diabetes Complications : Retinopathy: no. Last ophthalmology exam was done on annually, following with ophthalmology regularly.  Nephropathy: no, on ACE/ARB / losartan Peripheral neuropathy: no Coronary artery disease: no Stroke: no  Relevant comorbidities and cardiovascular risk factors: Obesity: yes Body mass index is 31.56 kg/m.  Hypertension: Yes  Hyperlipidemia : Yes, on statin   Current / Home Diabetic regimen includes:  Metformin 500 mg two times a day, in the morning and at bedtime. Glipizide 5 mg 2 times a day in the morning and  at bedtime..  Prior diabetic medications:  Glycemic data:   She has not been checking or monitoring blood sugar at home.  Hypoglycemia: Patient denies hypoglycemic episodes. Patient has hypoglycemia awareness.  Factors modifying glucose control: 1.  Diabetic diet assessment: 3 meals a day, occasionally high sugar food.  2.  Staying active or exercising: Plan to restart exercise regularly.  3.  Medication compliance: compliant all of the time.  Interval history  Patient presented to establish diabetes care.  She denies numbness and tingling of the feet.  No vision problem.  No other complaints today.  Hemoglobin A1c 5.7% today.  REVIEW OF SYSTEMS As per history of present illness.   PAST MEDICAL HISTORY: Past Medical History:  Diagnosis Date   Arthritis    Bilateral swelling of legs and ankles    Borderline diabetes    Fibrocystic breast disease    Heart murmur    very slight   History of nonmelanoma skin cancer    HTN (hypertension)    Hx of blood clots    Hyperlipidemia    Hypokalemia    Prediabetes    pulmonary emboli 04/2005   same time as oophrectomy/myomectomy, evlauation was negative   Skin cancer    Sleep apnea    CPAP    PAST SURGICAL HISTORY:  Past Surgical History:  Procedure Laterality Date   COLONOSCOPY  11/05/2009   OOPHORECTOMY  04/24/2005   only had one ovary removed unsure of rt or left   TONSILLECTOMY AND ADENOIDECTOMY  07/25/1961   TOTAL HIP ARTHROPLASTY Left 08/28/2015   Procedure: LEFT TOTAL HIP ARTHROPLASTY ANTERIOR APPROACH;  Surgeon: Kathryne Hitch, MD;  Location: [facility];  Service: Orthopedics;  Laterality: Left;    ALLERGIES: Allergies  Allergen Reactions   Ampicillin Rash    Has patient had a PCN reaction causing immediate rash, facial/tongue/throat swelling, SOB or lightheadedness with hypotension: Unsure Has patient had a PCN reaction causing severe rash involving mucus membranes or skin necrosis: No Has patient had a PCN  reaction that required hospitalization No Has patient had a PCN reaction occurring within the last 10 years: No If all of the above answers are "NO", then may proceed with Cephalosporin use.    FAMILY HISTORY:  Family History  Problem Relation Age of Onset   Stomach cancer Mother    Breast cancer Mother 82       mid 91s   Hypertension Mother    Hyperlipidemia Mother    Cancer Mother    Leukemia Father    Stroke Maternal Grandfather    Colon cancer Neg Hx    Esophageal cancer Neg Hx     SOCIAL HISTORY: Social History   Socioeconomic History   Marital status: Married    Spouse name: Not on file   Number of children: Not on file   Years of education: Not on file   Highest education level: Not on file  Occupational History   Occupation: attorney  Tobacco Use   Smoking status: Never   Smokeless tobacco: Never  Vaping Use   Vaping status: Never Used  Substance and Sexual Activity   Alcohol use: Not Currently    Comment: occasional   Drug use: No   Sexual activity: Never  Other Topics Concern   Not on file  Social History Narrative   Not on file   Social Drivers of Health   Financial Resource Strain: Not on file  Food Insecurity: Not on file  Transportation Needs: Not on file  Physical Activity: Not on file  Stress: Not on file  Social Connections: Not on file    MEDICATIONS:  Current Outpatient Medications  Medication Sig Dispense Refill   amLODipine (NORVASC) 2.5 MG tablet TAKE 1 TABLET BY MOUTH EVERY DAY 90 tablet 1   Blood Glucose Monitoring Suppl DEVI 1 each by Does not apply route in the morning, at noon, and at bedtime. May substitute to any manufacturer covered by patient's insurance. 1 each 0   calcium carbonate (OS-CAL) 600 MG TABS Take 600 mg by mouth 2 (two) times daily with a meal.     fish oil-omega-3 fatty acids 1000 MG capsule Take 1 g by mouth daily.     furosemide (LASIX) 40 MG tablet TAKE 1 TABLET BY MOUTH EVERY DAY 90 tablet 1   glipiZIDE  (GLUCOTROL) 5 MG tablet TAKE 1 TABLET (5 MG TOTAL) BY MOUTH TWICE A DAY BEFORE MEALS 180 tablet 1   Glucose Blood (BLOOD GLUCOSE TEST STRIPS) STRP 1 each by In Vitro route daily. May substitute to any manufacturer covered by patient's insurance. 100 each 3   Lancet Device MISC 1 each by Does not apply route in the morning, at noon, and at bedtime. May substitute to any manufacturer covered by patient's insurance. 1 each 0   loratadine (CLARITIN)  10 MG tablet Take 10 mg by mouth daily.     losartan (COZAAR) 100 MG tablet TAKE 1 TABLET BY MOUTH EVERY DAY 90 tablet 1   metFORMIN (GLUCOPHAGE) 500 MG tablet TAKE 1 TABLET BY MOUTH TWICE A DAY WITH FOOD 180 tablet 1   metoprolol tartrate (LOPRESSOR) 50 MG tablet TAKE 1 TABLET BY MOUTH EVERY DAY 90 tablet 1   Multiple Vitamin (MULTIVITAMIN) capsule Take 1 capsule by mouth daily.     potassium chloride SA (K-DUR) 20 MEQ tablet TAKE ONE TABLET BY MOUTH TWICE DAILY 180 tablet 3   rosuvastatin (CRESTOR) 10 MG tablet TAKE 1 TABLET BY MOUTH EVERY DAY 90 tablet 3   Accu-Chek Softclix Lancets lancets USE 1 DAILY AS DIRECTED 100 each 3   Semaglutide,0.25 or 0.5MG /DOS, 2 MG/3ML SOPN Inject 0.25 mg into the skin once a week for 4 weeks and increase to 0.5 mg weekly. 3 mL 4   No current facility-administered medications for this visit.    PHYSICAL EXAM: Vitals:   08/15/23 1420  BP: 132/80  Pulse: 78  Resp: 20  SpO2: 99%  Weight: 197 lb (89.4 kg)  Height: 5' 6.25" (1.683 m)   Body mass index is 31.56 kg/m.  Wt Readings from Last 3 Encounters:  08/15/23 197 lb (89.4 kg)  05/29/23 192 lb (87.1 kg)  04/04/23 191 lb (86.6 kg)    General: Well developed, well nourished female in no apparent distress.  HEENT: AT/[place], no external lesions.  Eyes: Conjunctiva clear and no icterus. Neck: Neck supple  Lungs: Respirations not labored Neurologic: Alert, oriented, normal speech Extremities / Skin: Dry. No sores or rashes noted. No acanthosis  nigricans Psychiatric: Does not appear depressed or anxious  Diabetic Foot Exam: Monofilament sensory exam intact / decreased b/l, no callus, no ulceration, Dorsalis Pedis 2+ b/l  Diabetic Foot Exam - Simple   Simple Foot Form Diabetic Foot exam was performed with the following findings: Yes 08/15/2023  2:59 PM  Visual Inspection No deformities, no ulcerations, no other skin breakdown bilaterally: Yes Sensation Testing Intact to touch and monofilament testing bilaterally: Yes Pulse Check Posterior Tibialis and Dorsalis pulse intact bilaterally: Yes Comments    LABS Reviewed Lab Results  Component Value Date   HGBA1C 5.7 (A) 08/15/2023   HGBA1C 6.0 (H) 03/30/2023   HGBA1C 6.1 (H) 09/08/2022   No results found for: "FRUCTOSAMINE" Lab Results  Component Value Date   CHOL 191 03/30/2023   HDL 64 03/30/2023   LDLCALC 99 03/30/2023   TRIG 181 (H) 03/30/2023   CHOLHDL 3.0 03/30/2023   Lab Results  Component Value Date   MICRALBCREAT 11 09/08/2022   MICRALBCREAT NOTE 06/02/2022   Lab Results  Component Value Date   CREATININE 0.98 10/31/2022   No results found for: "GFR"   Latest Reference Range & Units 09/08/22 10:04  Microalb, Ur mg/dL 0.4  MICROALB/CREAT RATIO <30 mcg/mg creat 11  Creatinine, Urine 20 - 275 mg/dL 35    ASSESSMENT / PLAN  1. Controlled type 2 diabetes mellitus without complication, without long-term current use of insulin (HCC)   2. Type 2 diabetes mellitus without complication, without long-term current use of insulin (HCC)     Diabetes Mellitus type 2, no known complications. - Diabetic status / severity: Controlled.  Lab Results  Component Value Date   HGBA1C 5.7 (A) 08/15/2023    - Hemoglobin A1c goal : <6.5%  Discussed about type 2 diabetes mellitus and potential chronic complications. Will start GLP-1 receptor agonist  to have weight loss benefit and maintain controlled diabetes status.  - Medications: See below.  I) continue  metformin 500 mg 2 times a day.  Advised to take with meals. II) start Ozempic 0.25 mg weekly for 4 weeks and increase to 0.5 mg weekly. III) after being on Ozempic 0.5 mg dose.  Glipizide.  - Home glucose testing: Few times a week in the morning fasting. - Discussed/ Gave Hypoglycemia treatment plan.  # Consult : Refer to dietitian/diabetes educator.  # Annual urine for microalbuminuria/ creatinine ratio, no microalbuminuria currently, continue ACE/ARB /losartan. Last  Lab Results  Component Value Date   MICRALBCREAT 11 09/08/2022    # Foot check nightly.  # Annual dilated diabetic eye exams.   - Diet: Make healthy diabetic food choices - Life style / activity / exercise: Discussed.  2. Blood pressure  -  BP Readings from Last 1 Encounters:  08/15/23 132/80    - Control is in target.  - No change in current plans.  3. Lipid status / Hyperlipidemia - Last  Lab Results  Component Value Date   LDLCALC 99 03/30/2023   - Continue rosuvastatin 10 mg daily.  Diagnoses and all orders for this visit:  Controlled type 2 diabetes mellitus without complication, without long-term current use of insulin (HCC) -     POCT glycosylated hemoglobin (Hb A1C)  Type 2 diabetes mellitus without complication, without long-term current use of insulin (HCC) -     Semaglutide,0.25 or 0.5MG /DOS, 2 MG/3ML SOPN; Inject 0.25 mg into the skin once a week for 4 weeks and increase to 0.5 mg weekly. -     Amb Referral to Nutrition and Diabetic Education  Other orders -     Discontinue: Semaglutide,0.25 or 0.5MG /DOS, 2 MG/3ML SOPN; Inject 0.25 mg into the skin once a week for 4 weeks and increase to 0.5 mg weekly. -     Blood Glucose Monitoring Suppl DEVI; 1 each by Does not apply route in the morning, at noon, and at bedtime. May substitute to any manufacturer covered by patient's insurance. -     Glucose Blood (BLOOD GLUCOSE TEST STRIPS) STRP; 1 each by In Vitro route daily. May substitute to any  manufacturer covered by patient's insurance. -     Lancet Device MISC; 1 each by Does not apply route in the morning, at noon, and at bedtime. May substitute to any manufacturer covered by patient's insurance. -     Discontinue: Lancets Misc. MISC; 1 each by Does not apply route daily. May substitute to any manufacturer covered by patient's insurance.    DISPOSITION Follow up in clinic in 4 months suggested.   All questions answered and patient verbalized understanding of the plan.  Maria Giles Currie, MD [facility] Endocrinology [facility] [address] [place], [address] [place], [place] [address] Phone # [phone]  At least part of this note was generated using voice recognition software. Inadvertent word errors may have occurred, which were not recognized during the proofreading process.

## 2023-08-16 ENCOUNTER — Encounter: Payer: Self-pay | Admitting: Endocrinology

## 2023-08-20 ENCOUNTER — Encounter: Payer: Self-pay | Admitting: Endocrinology

## 2023-08-21 ENCOUNTER — Other Ambulatory Visit: Payer: Self-pay

## 2023-08-21 DIAGNOSIS — E119 Type 2 diabetes mellitus without complications: Secondary | ICD-10-CM

## 2023-08-21 MED ORDER — BLOOD GLUCOSE MONITORING SUPPL DEVI: 1.0000 | Freq: Three times a day (TID) | 0 refills | Status: AC

## 2023-10-06 ENCOUNTER — Encounter: Payer: Medicare Other | Attending: Endocrinology | Admitting: Dietician

## 2023-10-06 ENCOUNTER — Encounter: Payer: Self-pay | Admitting: Dietician

## 2023-10-06 VITALS — Ht 66.0 in | Wt 189.0 lb

## 2023-10-06 DIAGNOSIS — E119 Type 2 diabetes mellitus without complications: Secondary | ICD-10-CM | POA: Insufficient documentation

## 2023-10-06 NOTE — Progress Notes (Signed)
 Diabetes Self-Management Education  Visit Type: First/Initial  Appt. Start Time: 1000 (late) Appt. End Time: 1110  10/06/2023  [name], identified by name and date of birth, is a 68 y.o. female with a diagnosis of Diabetes: Type 2.   ASSESSMENT Patient is here today alone.   She is checking her blood glucose occasionally and it is between 100 and 115 fasting or HS She would like to lose weight She would like to reduce her medication  History:  Type 2 Diabetes 2016, HTN, HLD, OSA on c-pap, kidney stone Medications/Supplements include:  Ozempic, metformin, lasix, calcium, MVI, omega 3 Labs noted to include:  5.7% 08/15/2023 decreased from 6% 03/30/2023, vitamin B-12 785, Vitamin D 37,  01/05/2021, GFR 54 10/2022  Lipid Panel     Component Value Date/Time   CHOL 191 03/30/2023 0942   CHOL 203 (H) 01/05/2021 1625   TRIG 181 (H) 03/30/2023 0942   HDL 64 03/30/2023 0942   HDL 68 01/05/2021 1625   CHOLHDL 3.0 03/30/2023 0942   VLDL 33 (H) 03/23/2017 1056   LDLCALC 99 03/30/2023 0942   LABVLDL 20 01/05/2021 1625    67" 189 lbs 10/06/2023 195 lbs 08/2023 197 highest adult weight 125-135 lbs UBW until age 25  Patient lives with her husband.  He does the shopping and cooking.  He is not a healthy cook but is willing to change. He walks with a cane. 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 She is a member of the [employer]. Toning and weights for 60 minutes 3 days per week. Hip pain with increased walking.  Height 5\' 6"  (1.676 m), weight 189 lb (85.7 kg), last menstrual period 12/04/2013. Body mass index is 30.51 kg/m.   Diabetes Self-Management Education - 10/06/23 1023       Visit Information   Visit Type First/Initial      Initial Visit   Diabetes Type Type 2    Date Diagnosed 2016    Are you currently following a meal plan? No    Are you taking your medications as prescribed? Yes      Health Coping   How would you rate your overall health? Fair       Psychosocial Assessment   Patient Belief/Attitude about Diabetes Motivated to manage diabetes    What is the hardest part about your diabetes right now, causing you the most concern, or is the most worrisome to you about your diabetes?   Making healty food and beverage choices    Self-care barriers None    Self-management support Doctor's office;Family    Other persons present Patient    Patient Concerns Nutrition/Meal planning;Weight Control;Healthy Lifestyle    Special Needs None    Preferred Learning Style No preference indicated    Learning Readiness Ready    How often do you need to have someone help you when you read instructions, pamphlets, or other written materials from your doctor or pharmacy? 1 - Never    What is the last grade level you completed in school? grad school      Pre-Education Assessment   Patient understands the diabetes disease and treatment process. Needs Instruction    Patient understands incorporating nutritional management into lifestyle. Needs Instruction    Patient undertands incorporating physical activity into lifestyle. Needs Instruction    Patient understands using medications safely. Needs Instruction    Patient understands monitoring blood glucose, interpreting and using results Needs Instruction    Patient understands prevention, detection,  and treatment of acute complications. Needs Instruction    Patient understands prevention, detection, and treatment of chronic complications. Needs Instruction    Patient understands how to develop strategies to address psychosocial issues. Needs Instruction    Patient understands how to develop strategies to promote health/change behavior. Needs Instruction      Complications   Last HgB A1C per patient/outside source 5.7 %   08/15/2023   How often do you check your blood sugar? 1-2 times/day    Fasting Blood glucose range (mg/dL) 52-841    Postprandial Blood glucose range (mg/dL) 32-440    Number of  hypoglycemic episodes per month 0    Number of hyperglycemic episodes ( >200mg /dL): Never    Have you had a dilated eye exam in the past 12 months? Yes    Have you had a dental exam in the past 12 months? Yes    Are you checking your feet? No      Dietary Intake   Breakfast egg McMuffin (canadian ham, egg, cheese, english muffin) OR protein bar    Snack (morning) none    Lunch salad, salmon    Snack (afternoon) none    Dinner pork tenderloin (fried), noodles, cake and ice cream    Snack (evening) none    Beverage(s) water, diet coke, seltzer water, ICE      Activity / Exercise   Activity / Exercise Type Light (walking / raking leaves)    How many days per week do you exercise? 3    How many minutes per day do you exercise? 60    Total minutes per week of exercise 180      Patient Education   Previous Diabetes Education No    Disease Pathophysiology Definition of diabetes, type 1 and 2, and the diagnosis of diabetes    Healthy Eating Role of diet in the treatment of diabetes and the relationship between the three main macronutrients and blood glucose level;Food label reading, portion sizes and measuring food.;Plate Method;Carbohydrate counting;Meal options for control of blood glucose level and chronic complications.;Effects of alcohol on blood glucose and safety factors with consumption of alcohol.    Being Active Role of exercise on diabetes management, blood pressure control and cardiac health.;Helped patient identify appropriate exercises in relation to his/her diabetes, diabetes complications and other health issue.    Medications Reviewed patients medication for diabetes, action, purpose, timing of dose and side effects.    Monitoring Yearly dilated eye exam;Daily foot exams;Identified appropriate SMBG and/or A1C goals.;Taught/discussed recording of test results and interpretation of SMBG.;Purpose and frequency of SMBG.    Acute complications Taught prevention, symptoms, and   treatment of hypoglycemia - the 15 rule.;Discussed and identified patients' prevention, symptoms, and treatment of hyperglycemia.    Chronic complications Relationship between chronic complications and blood glucose control    Diabetes Stress and Support Identified and addressed patients feelings and concerns about diabetes;Worked with patient to identify barriers to care and solutions;Role of stress on diabetes      Individualized Goals (developed by patient)   Nutrition General guidelines for healthy choices and portions discussed    Physical Activity Exercise 5-7 days per week;30 minutes per day    Medications take my medication as prescribed    Monitoring  Test my blood glucose as discussed    Problem Solving Eating Pattern    Reducing Risk examine blood glucose patterns;do foot checks daily;treat hypoglycemia with 15 grams of carbs if blood glucose less than 70mg /dL  Post-Education Assessment   Patient understands the diabetes disease and treatment process. Demonstrates understanding / competency    Patient understands incorporating nutritional management into lifestyle. Demonstrates understanding / competency    Patient undertands incorporating physical activity into lifestyle. Demonstrates understanding / competency    Patient understands using medications safely. Demonstrates understanding / competency    Patient understands monitoring blood glucose, interpreting and using results Demonstrates understanding / competency    Patient understands prevention, detection, and treatment of acute complications. Demonstrates understanding / competency    Patient understands prevention, detection, and treatment of chronic complications. Demonstrates understanding / competency    Patient understands how to develop strategies to address psychosocial issues. Demonstrates understanding / competency    Patient understands how to develop strategies to promote health/change behavior. Demonstrates  understanding / competency      Outcomes   Expected Outcomes Demonstrated interest in learning. Expect positive outcomes    Future DMSE PRN    Program Status Completed             Individualized Plan for Diabetes Self-Management Training:   Learning Objective:  Patient will have a greater understanding of diabetes self-management. Patient education plan is to attend individual and/or group sessions per assessed needs and concerns.   Plan:   Patient Instructions  Plan:  Aim for 2-3 Carb Choices per meal (30-45 grams) +/- 1 either way  Aim for 0-1 Carbs per snack if hungry  Include protein in moderation with your meals and snacks Consider reading food labels for Total Carbohydrate of foods Consider  increasing your activity level by class or walking etc for 30 minutes daily as tolerated Consider checking BG at alternate times per day  Consider taking medication  as directed by MD  Expected Outcomes:  Demonstrated interest in learning. Expect positive outcomes  Education material provided: ADA - How to Thrive: A Guide for Your Journey with Diabetes, Food label handouts, Meal plan card, Snack sheet, and Diabetes Resources  If problems or questions, patient to contact team via:  Phone  Future DSME appointment: PRN

## 2023-10-06 NOTE — Patient Instructions (Addendum)
 Plan:  Aim for 2-3 Carb Choices per meal (30-45 grams) +/- 1 either way  Aim for 0-1 Carbs per snack if hungry  Include protein in moderation with your meals and snacks Consider reading food labels for Total Carbohydrate of foods Consider  increasing your activity level by class or walking etc for 30 minutes daily as tolerated Consider checking BG at alternate times per day  Consider taking medication  as directed by MD

## 2023-10-11 ENCOUNTER — Ambulatory Visit (INDEPENDENT_AMBULATORY_CARE_PROVIDER_SITE_OTHER): Admitting: Sports Medicine

## 2023-10-11 ENCOUNTER — Other Ambulatory Visit: Payer: Self-pay

## 2023-10-11 DIAGNOSIS — M25551 Pain in right hip: Secondary | ICD-10-CM | POA: Diagnosis not present

## 2023-10-11 DIAGNOSIS — M1611 Unilateral primary osteoarthritis, right hip: Secondary | ICD-10-CM | POA: Diagnosis not present

## 2023-10-11 MED ORDER — LIDOCAINE HCL 1 % IJ SOLN
4.0000 mL | INTRAMUSCULAR | Status: AC | PRN
  Administered 2023-10-11: 4 mL

## 2023-10-11 MED ORDER — METHYLPREDNISOLONE ACETATE 40 MG/ML IJ SUSP
80.0000 mg | INTRAMUSCULAR | Status: AC | PRN
  Administered 2023-10-11: 80 mg via INTRA_ARTICULAR

## 2023-10-11 NOTE — Progress Notes (Signed)
 Patient says that her last injection did well. She says that she is getting ready to go on another trip where she will be walking a lot, so would like to repeat the hip injection prior to that. She says that her pain has gotten progressively worse over the last month and feels similar to the pain she felt prior to the injection.

## 2023-10-11 NOTE — Progress Notes (Signed)
 Maria Mcpherson - 68 y.o. female MRN [id number]  Date of birth: 06/19/1956  Office Visit Note: Visit Date: 10/11/2023 PCP: Margaree Mackintosh, MD Referred by: Margaree Mackintosh, MD  Subjective: Chief Complaint  Patient presents with   Right Hip - Pain    Injection   HPI: Maria Mcpherson is a pleasant 68 y.o. female who presents today for acute on chronic right hip pain with known hip OA.  Back in November hip injection which gave her excellent relief.  She does know that she does have radiographic evidence of progressive osteoarthritis.  She is planning on going on another trip and will be doing a lot of walking and is interested in repeating injection prior to this to maximize her pain relief and function.  Lab Results  Component Value Date   HGBA1C 5.7 (A) 08/15/2023   Pertinent ROS were reviewed with the patient and found to be negative unless otherwise specified above in HPI.   Assessment & Plan: Visit Diagnoses:  1. Unilateral primary osteoarthritis, right hip   2. Pain of right hip    Plan: Impression is exacerbation of chronic right hip pain with moderate to severe osteoarthritis.  She did receive about 3-4 months of good relief from prior injection.  Through shared decision making, we repeat this today.  Advised on 48 hours of modified rest/activity.  Following this she may return to walking and other physical activity.  She may use ibuprofen, Tylenol or ice for any pain needed.  We did discuss the nature of her progressive hip arthritis, as long as she is still receiving good and prolonged relief from injections, we can always consider repeating the scan frequently.  If eventually these are not as helpful, we will get her back to Dr. Magnus Ivan to further discuss THA.   Follow-up: Return in about 3 months (around 01/11/2024), or if symptoms worsen or fail to improve.   Meds & Orders: No orders of the defined types were placed in this encounter.   Orders Placed This Encounter   Procedures   Large Joint Inj   US Guided Needle Placement - No Linked Charges     Procedures: Large Joint Inj: R hip joint on 10/11/2023 8:52 AM Indications: pain Details: 22 G 3.5 in needle, ultrasound-guided anterior approach Medications: 4 mL lidocaine 1 %; 80 mg methylPREDNISolone acetate 40 MG/ML Outcome: tolerated well, no immediate complications  Procedure: US-guided intra-articular hip injection, Right After discussion on risks/benefits/indications and informed verbal consent was obtained, a timeout was performed. Patient was lying supine on exam table. The hip was cleaned with betadine and alcohol swabs. Then utilizing ultrasound guidance, the patient's femoral head and neck junction was identified and subsequently injected with 4:2 lidocaine:depomedrol via an in-plane approach with ultrasound visualization of the injectate administered into the hip joint. Patient tolerated procedure well without immediate complications.  Procedure, treatment alternatives, risks and benefits explained, specific risks discussed. Consent was given by the patient. Immediately prior to procedure a time out was called to verify the correct patient, procedure, equipment, support staff and site/side marked as required. Patient was prepped and draped in the usual sterile fashion.          Clinical History: No specialty comments available.  She reports that she has never smoked. She has never used smokeless tobacco.  Recent Labs    03/30/23 0942 08/15/23 1504  HGBA1C 6.0* 5.7*    Objective:   Vital Signs: LMP 12/04/2013   Physical Exam  Gen: Well-appearing, in  no acute distress; non-toxic CV: Well-perfused. Warm.  Resp: Breathing unlabored on room air; no wheezing. Psych: Fluid speech in conversation; appropriate affect; normal thought process  Ortho Exam - Right hip: There is limited internal rotation with logroll.  Positive Stinchfield and FADIR testing with limited mobility.  There is no  redness swelling or effusion.  Imaging:  *I did personally review her x-rays from 05/29/23 today, agree with below findings.  She is also status post left hip THA on x-ray.  05/29/23: An AP pelvis and lateral the right hip shows worsening arthritis when  compared to previous films.  There is superior lateral joint space  narrowing and osteophytes around the right femoral head.  This is more  moderate to severe arthritis at this standpoint.   Past Medical/Family/Surgical/Social History: Medications & Allergies reviewed per EMR, new medications updated. Patient Active Problem List   Diagnosis Date Noted   Unilateral primary osteoarthritis, right hip 05/29/2023   Kidney stone 11/12/2022   Basal cell carcinoma (BCC) of left side of nose 08/23/2019   OSA (obstructive sleep apnea) 01/28/2016   Hypersomnia 10/14/2015   HTN (hypertension) 10/14/2015   Overweight 10/14/2015   Dyspnea on exertion 10/14/2015   Status post total replacement of left hip 08/28/2015   Impaired glucose tolerance 10/21/2014   Hypokalemia 10/21/2014   Obesity 10/21/2014   Metabolic syndrome 10/21/2014   Dependent edema 10/03/2012   Fibrocystic breast disease 10/22/2011   Hyperlipemia 01/10/2011   Essential hypertension, benign 01/10/2011   Past Medical History:  Diagnosis Date   Arthritis    Bilateral swelling of legs and ankles    Borderline diabetes    Diabetes mellitus without complication (HCC)    Fibrocystic breast disease    Heart murmur    very slight   History of nonmelanoma skin cancer    HTN (hypertension)    Hx of blood clots    Hyperlipidemia    Hypokalemia    Prediabetes    pulmonary emboli 04/2005   same time as oophrectomy/myomectomy, evlauation was negative   Skin cancer    Sleep apnea    CPAP   Family History  Problem Relation Age of Onset   Stomach cancer Mother    Breast cancer Mother 11       mid 27s   Hypertension Mother    Hyperlipidemia Mother    Cancer Mother     Leukemia Father    Stroke Maternal Grandfather    Colon cancer Neg Hx    Esophageal cancer Neg Hx    Past Surgical History:  Procedure Laterality Date   COLONOSCOPY  11/05/2009   OOPHORECTOMY  04/24/2005   only had one ovary removed unsure of rt or left   TONSILLECTOMY AND ADENOIDECTOMY  07/25/1961   TOTAL HIP ARTHROPLASTY Left 08/28/2015   Procedure: LEFT TOTAL HIP ARTHROPLASTY ANTERIOR APPROACH;  Surgeon: Kathryne Hitch, MD;  Location: [facility];  Service: Orthopedics;  Laterality: Left;   Social History   Occupational History   Occupation: attorney  Tobacco Use   Smoking status: Never   Smokeless tobacco: Never  Vaping Use   Vaping status: Never Used  Substance and Sexual Activity   Alcohol use: Not Currently    Comment: occasional   Drug use: No   Sexual activity: Never

## 2023-10-30 LAB — HM MAMMOGRAPHY

## 2023-11-11 ENCOUNTER — Other Ambulatory Visit: Payer: Self-pay | Admitting: Internal Medicine

## 2023-11-12 ENCOUNTER — Encounter: Payer: Self-pay | Admitting: Internal Medicine

## 2023-11-13 ENCOUNTER — Other Ambulatory Visit: Payer: Self-pay

## 2023-11-13 ENCOUNTER — Other Ambulatory Visit: Payer: Self-pay | Admitting: Family

## 2023-11-13 DIAGNOSIS — E785 Hyperlipidemia, unspecified: Secondary | ICD-10-CM

## 2023-11-13 DIAGNOSIS — I10 Essential (primary) hypertension: Secondary | ICD-10-CM

## 2023-11-13 MED ORDER — LORATADINE 10 MG PO TABS: 10.0000 mg | ORAL_TABLET | Freq: Every day | ORAL | 1 refills | Status: DC

## 2023-11-13 MED ORDER — ROSUVASTATIN CALCIUM 10 MG PO TABS: 10.0000 mg | ORAL_TABLET | Freq: Every day | ORAL | 1 refills | Status: DC

## 2023-11-13 MED ORDER — FUROSEMIDE 40 MG PO TABS: 40.0000 mg | ORAL_TABLET | Freq: Every day | ORAL | 1 refills | Status: DC

## 2023-11-13 MED ORDER — CALCIUM CARBONATE 600 MG PO TABS: 600.0000 mg | ORAL_TABLET | Freq: Two times a day (BID) | ORAL | 1 refills | Status: DC

## 2023-11-13 MED ORDER — METOPROLOL TARTRATE 50 MG PO TABS: 50.0000 mg | ORAL_TABLET | Freq: Every day | ORAL | 1 refills | Status: DC

## 2023-11-13 MED ORDER — ROSUVASTATIN CALCIUM 10 MG PO TABS: 10.0000 mg | ORAL_TABLET | Freq: Every day | ORAL | 3 refills | Status: DC

## 2023-11-13 MED ORDER — METFORMIN HCL 500 MG PO TABS: 500.0000 mg | ORAL_TABLET | Freq: Two times a day (BID) | ORAL | 1 refills | Status: DC

## 2023-11-13 MED ORDER — LOSARTAN POTASSIUM 100 MG PO TABS: 100.0000 mg | ORAL_TABLET | Freq: Every day | ORAL | 1 refills | Status: DC

## 2023-11-13 MED ORDER — AMLODIPINE BESYLATE 2.5 MG PO TABS: 2.5000 mg | ORAL_TABLET | Freq: Every day | ORAL | 1 refills | Status: DC

## 2023-11-14 ENCOUNTER — Other Ambulatory Visit: Payer: Self-pay | Admitting: Family

## 2023-11-14 DIAGNOSIS — I10 Essential (primary) hypertension: Secondary | ICD-10-CM

## 2023-11-14 DIAGNOSIS — E785 Hyperlipidemia, unspecified: Secondary | ICD-10-CM

## 2023-11-14 MED ORDER — LOSARTAN POTASSIUM 100 MG PO TABS: 100.0000 mg | ORAL_TABLET | Freq: Every day | ORAL | 1 refills | Status: DC

## 2023-11-14 MED ORDER — METOPROLOL TARTRATE 50 MG PO TABS: 50.0000 mg | ORAL_TABLET | Freq: Every day | ORAL | 1 refills | Status: DC

## 2023-11-14 MED ORDER — FUROSEMIDE 40 MG PO TABS: 40.0000 mg | ORAL_TABLET | Freq: Every day | ORAL | 1 refills | Status: DC

## 2023-11-14 MED ORDER — AMLODIPINE BESYLATE 2.5 MG PO TABS: 2.5000 mg | ORAL_TABLET | Freq: Every day | ORAL | 1 refills | Status: DC

## 2023-11-14 MED ORDER — ROSUVASTATIN CALCIUM 10 MG PO TABS: 10.0000 mg | ORAL_TABLET | Freq: Every day | ORAL | 1 refills | Status: DC

## 2023-11-14 MED ORDER — METFORMIN HCL 500 MG PO TABS
500.0000 mg | ORAL_TABLET | Freq: Two times a day (BID) | ORAL | 1 refills | Status: DC
Start: 2023-11-14 — End: 2024-06-11

## 2023-12-13 ENCOUNTER — Ambulatory Visit: Payer: Self-pay | Admitting: Endocrinology

## 2023-12-13 ENCOUNTER — Ambulatory Visit (INDEPENDENT_AMBULATORY_CARE_PROVIDER_SITE_OTHER): Payer: Medicare Other | Admitting: Endocrinology

## 2023-12-13 ENCOUNTER — Encounter: Payer: Self-pay | Admitting: Endocrinology

## 2023-12-13 VITALS — BP 124/80 | HR 71 | Resp 20 | Ht 66.0 in | Wt 186.0 lb

## 2023-12-13 DIAGNOSIS — E119 Type 2 diabetes mellitus without complications: Secondary | ICD-10-CM

## 2023-12-13 DIAGNOSIS — Z7985 Long-term (current) use of injectable non-insulin antidiabetic drugs: Secondary | ICD-10-CM | POA: Diagnosis not present

## 2023-12-13 DIAGNOSIS — E785 Hyperlipidemia, unspecified: Secondary | ICD-10-CM

## 2023-12-13 DIAGNOSIS — Z7984 Long term (current) use of oral hypoglycemic drugs: Secondary | ICD-10-CM

## 2023-12-13 LAB — POCT GLYCOSYLATED HEMOGLOBIN (HGB A1C): Hemoglobin A1C: 5.6 % (ref 4.0–5.6)

## 2023-12-13 MED ORDER — OZEMPIC (0.25 OR 0.5 MG/DOSE) 2 MG/1.5ML ~~LOC~~ SOPN: 0.5000 mg | PEN_INJECTOR | SUBCUTANEOUS | 3 refills | Status: DC

## 2023-12-13 NOTE — Progress Notes (Signed)
 Outpatient Endocrinology Note Maria Marcellas Marchant, MD   Patient's Name: Maria Mcpherson    DOB: 01/15/1956    MRN: [id number]                                                    REASON OF VISIT: Follow-up for type 2 diabetes mellitus  REFERRING PROVIDER: Sylvan Evener, MD  PCP: Sylvan Evener, MD  HISTORY OF PRESENT ILLNESS:   Maria Mcpherson is a 68 y.o. old female with past medical history listed below, is here for follow-up for type 2 diabetes mellitus.   Pertinent Diabetes History: Patient had prediabetes diagnosed in 2012, she was started on metformin  in 2016, she was being managed as type 2 diabetes mellitus with with metformin  and glipizide .  She had monitoring for chronic complications as listed below of diabetes in the past.  Januvia  was planned in October 2024 however was not cost effective.  Hemoglobin A1c mostly in the range of 5.6 to 6.2% range.  She has remained controlled on 2 antidiabetic medications.  Although her hemoglobin A1c in the range of 6% range , she has been able to maintain this with 2 antidiabetic medications, we will treat her as controlled type 2 diabetes mellitus.  Initial consult in January 2025.  History of DKA or diabetes related hospitalizations: none  Previous diabetes education: no  Family h/o diabetes mellitus: none   No personal history of pancreatitis and / or family history of medullary thyroid  carcinoma or MEN 2B syndrome.   Chronic Diabetes Complications : Retinopathy: no. Last ophthalmology exam was done on annually, following with ophthalmology regularly.  Nephropathy: no, on ACE/ARB / losartan  Peripheral neuropathy: no Coronary artery disease: no Stroke: no  Relevant comorbidities and cardiovascular risk factors: Obesity: yes Body mass index is 30.02 kg/m.  Hypertension: Yes  Hyperlipidemia : Yes, on statin   Current / Home Diabetic regimen includes:  Metformin  500 mg two times a day, in the morning and at bedtime. Ozempic  0.5 mg  weekly  Prior diabetic medications: Glipizide  is stopped after restarting Ozempic .  Ozempic  was started in January 2025.  Glycemic data:   She has been checking blood sugar occasionally in the morning fasting, report blood sugar in the range of 135-150.  Rarely blood sugar up to 180.  She has not been checking or monitoring blood sugar at home.  Hypoglycemia: Patient denies hypoglycemic episodes. Patient has hypoglycemia awareness.  Factors modifying glucose control: 1.  Diabetic diet assessment: 3 meals a day, occasionally high sugar food.  2.  Staying active or exercising: Plan to restart exercise regularly.  3.  Medication compliance: compliant all of the time.  Interval history  Hemoglobin A1c 5.6% controlled.  She has been tolerating Ozempic  well, currently taking 0.5 mg weekly.  Denies nausea or any abdominal discomfort.  She lost about 10 pounds of weight in last 4 months, after being on Ozempic .  No other complaints today.  REVIEW OF SYSTEMS As per history of present illness.   PAST MEDICAL HISTORY: Past Medical History:  Diagnosis Date   Arthritis    Bilateral swelling of legs and ankles    Borderline diabetes    Diabetes mellitus without complication (HCC)    Fibrocystic breast disease    Heart murmur    very slight   History of nonmelanoma skin cancer  HTN (hypertension)    Hx of blood clots    Hyperlipidemia    Hypokalemia    Prediabetes    pulmonary emboli 04/2005   same time as oophrectomy/myomectomy, evlauation was negative   Skin cancer    Sleep apnea    CPAP    PAST SURGICAL HISTORY: Past Surgical History:  Procedure Laterality Date   COLONOSCOPY  11/05/2009   OOPHORECTOMY  04/24/2005   only had one ovary removed unsure of rt or left   TONSILLECTOMY AND ADENOIDECTOMY  07/25/1961   TOTAL HIP ARTHROPLASTY Left 08/28/2015   Procedure: LEFT TOTAL HIP ARTHROPLASTY ANTERIOR APPROACH;  Surgeon: Arnie Lao, MD;  Location: [facility];   Service: Orthopedics;  Laterality: Left;    ALLERGIES: Allergies  Allergen Reactions   Ampicillin Rash    Has patient had a PCN reaction causing immediate rash, facial/tongue/throat swelling, SOB or lightheadedness with hypotension: Unsure Has patient had a PCN reaction causing severe rash involving mucus membranes or skin necrosis: No Has patient had a PCN reaction that required hospitalization No Has patient had a PCN reaction occurring within the last 10 years: No If all of the above answers are "NO", then may proceed with Cephalosporin use.    FAMILY HISTORY:  Family History  Problem Relation Age of Onset   Stomach cancer Mother    Breast cancer Mother 28       mid 63s   Hypertension Mother    Hyperlipidemia Mother    Cancer Mother    Leukemia Father    Stroke Maternal Grandfather    Colon cancer Neg Hx    Esophageal cancer Neg Hx     SOCIAL HISTORY: Social History   Socioeconomic History   Marital status: Married    Spouse name: Not on file   Number of children: Not on file   Years of education: Not on file   Highest education level: Not on file  Occupational History   Occupation: attorney  Tobacco Use   Smoking status: Never   Smokeless tobacco: Never  Vaping Use   Vaping status: Never Used  Substance and Sexual Activity   Alcohol  use: Not Currently    Comment: occasional   Drug use: No   Sexual activity: Never  Other Topics Concern   Not on file  Social History Narrative   Not on file   Social Drivers of Health   Financial Resource Strain: Not on file  Food Insecurity: Not on file  Transportation Needs: Not on file  Physical Activity: Not on file  Stress: Not on file  Social Connections: Not on file    MEDICATIONS:  Current Outpatient Medications  Medication Sig Dispense Refill   Accu-Chek Softclix Lancets lancets USE 1 DAILY AS DIRECTED 100 each 3   amLODipine  (NORVASC ) 2.5 MG tablet Take 1 tablet (2.5 mg total) by mouth daily. 90 tablet 1    Blood Glucose Monitoring Suppl DEVI 1 each by Does not apply route in the morning, at noon, and at bedtime. May substitute to any manufacturer covered by patient's insurance. 1 each 0   calcium  carbonate (OS-CAL) 600 MG TABS tablet Take 1 tablet (600 mg total) by mouth 2 (two) times daily with a meal. 180 tablet 1   fish oil-omega-3 fatty acids 1000 MG capsule Take 1 g by mouth daily.     furosemide  (LASIX ) 40 MG tablet Take 1 tablet (40 mg total) by mouth daily. 90 tablet 1   Glucose Blood (BLOOD GLUCOSE TEST STRIPS) STRP  1 each by In Vitro route daily. May substitute to any manufacturer covered by patient's insurance. 100 each 3   loratadine  (CLARITIN ) 10 MG tablet Take 1 tablet (10 mg total) by mouth daily. 90 tablet 1   losartan  (COZAAR ) 100 MG tablet Take 1 tablet (100 mg total) by mouth daily. 90 tablet 1   metFORMIN  (GLUCOPHAGE ) 500 MG tablet Take 1 tablet (500 mg total) by mouth 2 (two) times daily with a meal. 180 tablet 1   metoprolol  tartrate (LOPRESSOR ) 50 MG tablet Take 1 tablet (50 mg total) by mouth daily. 90 tablet 1   Multiple Vitamin (MULTIVITAMIN) capsule Take 1 capsule by mouth daily.     potassium chloride  SA (K-DUR) 20 MEQ tablet TAKE ONE TABLET BY MOUTH TWICE DAILY 180 tablet 3   rosuvastatin  (CRESTOR ) 10 MG tablet Take 1 tablet (10 mg total) by mouth daily. 90 tablet 1   Semaglutide ,0.25 or 0.5MG /DOS, (OZEMPIC , 0.25 OR 0.5 MG/DOSE,) 2 MG/1.5ML SOPN Inject 0.5 mg into the skin once a week. 9 mL 3   No current facility-administered medications for this visit.    PHYSICAL EXAM: Vitals:   12/13/23 1421  BP: 124/80  Pulse: 71  Resp: 20  SpO2: 97%  Weight: 186 lb (84.4 kg)  Height: 5\' 6"  (1.676 m)   Body mass index is 30.02 kg/m.  Wt Readings from Last 3 Encounters:  12/13/23 186 lb (84.4 kg)  10/06/23 189 lb (85.7 kg)  08/15/23 197 lb (89.4 kg)    General: Well developed, well nourished female in no apparent distress.  HEENT: AT/[place], no external lesions.   Eyes: Conjunctiva clear and no icterus. Neck: Neck supple  Lungs: Respirations not labored Neurologic: Alert, oriented, normal speech Extremities / Skin: Dry.  Psychiatric: Does not appear depressed or anxious  Diabetic Foot Exam - Simple   No data filed    LABS Reviewed Lab Results  Component Value Date   HGBA1C 5.6 12/13/2023   HGBA1C 5.7 (A) 08/15/2023   HGBA1C 6.0 (H) 03/30/2023   No results found for: "FRUCTOSAMINE" Lab Results  Component Value Date   CHOL 191 03/30/2023   HDL 64 03/30/2023   LDLCALC 99 03/30/2023   TRIG 181 (H) 03/30/2023   CHOLHDL 3.0 03/30/2023   Lab Results  Component Value Date   MICRALBCREAT 11 09/08/2022   MICRALBCREAT NOTE 06/02/2022   Lab Results  Component Value Date   CREATININE 0.98 10/31/2022   No results found for: "GFR"   Latest Reference Range & Units 09/08/22 10:04  Microalb, Ur mg/dL 0.4  MICROALB/CREAT RATIO <30 mcg/mg creat 11  Creatinine, Urine 20 - 275 mg/dL 35    ASSESSMENT / PLAN  1. Controlled type 2 diabetes mellitus without complication, without long-term current use of insulin  (HCC)   2. Type 2 diabetes mellitus without complication, without long-term current use of insulin  (HCC)     Diabetes Mellitus type 2, no known complications. - Diabetic status / severity: Controlled.  Lab Results  Component Value Date   HGBA1C 5.6 12/13/2023    - Hemoglobin A1c goal : <6.5%   - Medications: See below.  I) continue metformin  500 mg 2 times a day.  Advised to take with meals. II) continue Ozempic  0.5 mg weekly.  Consider to increase Ozempic  for weight loss benefit.  Patient wants to stay on current dose for now.  - Home glucose testing: Few times a week in the morning fasting.  Asked to bring glucometer in the follow-up visit.  - Discussed/  Gave Hypoglycemia treatment plan.  # Consult : Refer to dietitian/diabetes educator.  # Annual urine for microalbuminuria/ creatinine ratio, no microalbuminuria  currently, continue ACE/ARB /losartan . Last  Lab Results  Component Value Date   MICRALBCREAT 11 09/08/2022    # Foot check nightly.  # Annual dilated diabetic eye exams.   - Diet: Make healthy diabetic food choices - Life style / activity / exercise: Discussed.  2. Blood pressure  -  BP Readings from Last 1 Encounters:  12/13/23 124/80    - Control is in target.  - No change in current plans.  3. Lipid status / Hyperlipidemia - Last  Lab Results  Component Value Date   LDLCALC 99 03/30/2023   - Continue rosuvastatin  10 mg daily.  Diagnoses and all orders for this visit:  Controlled type 2 diabetes mellitus without complication, without long-term current use of insulin  (HCC) -     POCT glycosylated hemoglobin (Hb A1C) -     Semaglutide ,0.25 or 0.5MG /DOS, (OZEMPIC , 0.25 OR 0.5 MG/DOSE,) 2 MG/1.5ML SOPN; Inject 0.5 mg into the skin once a week. -     Microalbumin / creatinine urine ratio  Type 2 diabetes mellitus without complication, without long-term current use of insulin  (HCC)    DISPOSITION Follow up in clinic in 6 months suggested.   All questions answered and patient verbalized understanding of the plan.  Maria Breanah Faddis, MD [facility] Endocrinology [facility] [address] [place], [address] [place], [place] [address] Phone # [phone]  At least part of this note was generated using voice recognition software. Inadvertent word errors may have occurred, which were not recognized during the proofreading process.

## 2023-12-14 LAB — MICROALBUMIN / CREATININE URINE RATIO
Creatinine, Urine: 145 mg/dL (ref 20–275)
Microalb Creat Ratio: 12 mg/g{creat} (ref <30)
Microalb, Ur: 1.8 mg/dL

## 2024-01-01 ENCOUNTER — Encounter: Payer: Self-pay | Admitting: Orthopaedic Surgery

## 2024-01-01 ENCOUNTER — Ambulatory Visit (INDEPENDENT_AMBULATORY_CARE_PROVIDER_SITE_OTHER): Admitting: Orthopaedic Surgery

## 2024-01-01 DIAGNOSIS — M1611 Unilateral primary osteoarthritis, right hip: Secondary | ICD-10-CM | POA: Diagnosis not present

## 2024-01-01 DIAGNOSIS — M25551 Pain in right hip: Secondary | ICD-10-CM

## 2024-01-01 NOTE — Progress Notes (Signed)
 The patient comes in today for follow-up as a relates to her arthritic and painful right hip.  We replaced her left hip successfully back in 2017 and that has done very well.  She has now had at least 2 steroid injections in her right hip under ultrasound and the last injection did not last her as long.  She reports daily right hip pain and groin pain with stiffness.  At this point it is detrimentally affecting her mobility, her quality of life and her actives daily living.  She does wish to proceed with a hip replacement on the right side at this standpoint and I agree with this as well.  She is an active 68 year old female.  I was able to review all of her past medical history and medications within epic.  She is not morbidly obese.  Her hemoglobin A1c 2 weeks ago was 5.6.  She is a type II diabetic.  On exam her right hip is significantly stiff with internal and external rotation and significant pain in the groin with rotation.  Her left hip that is replaced is smooth in terms of his rotation and no pain at all.  Previous x-rays of her pelvis and right hip shows severe arthritis of the right hip with bone-on-bone wear and narrowing as well as osteophytes around the hip.  At this point we will work on scheduling her for her right hip replacement.  Having had this before she is fully aware of the risks and benefits of the surgery and what to expect from an intraoperative and postoperative standpoint.

## 2024-01-08 ENCOUNTER — Encounter: Payer: Self-pay | Admitting: Endocrinology

## 2024-01-08 ENCOUNTER — Other Ambulatory Visit

## 2024-01-08 DIAGNOSIS — Z0000 Encounter for general adult medical examination without abnormal findings: Secondary | ICD-10-CM

## 2024-01-08 DIAGNOSIS — E785 Hyperlipidemia, unspecified: Secondary | ICD-10-CM

## 2024-01-08 DIAGNOSIS — E1159 Type 2 diabetes mellitus with other circulatory complications: Secondary | ICD-10-CM

## 2024-01-08 DIAGNOSIS — I10 Essential (primary) hypertension: Secondary | ICD-10-CM

## 2024-01-08 DIAGNOSIS — E1169 Type 2 diabetes mellitus with other specified complication: Secondary | ICD-10-CM

## 2024-01-08 DIAGNOSIS — I152 Hypertension secondary to endocrine disorders: Secondary | ICD-10-CM

## 2024-01-08 DIAGNOSIS — E119 Type 2 diabetes mellitus without complications: Secondary | ICD-10-CM

## 2024-01-08 MED ORDER — SEMAGLUTIDE (1 MG/DOSE) 4 MG/3ML ~~LOC~~ SOPN: 1.0000 mg | PEN_INJECTOR | SUBCUTANEOUS | 11 refills | Status: DC

## 2024-01-08 NOTE — Telephone Encounter (Signed)
 I sent prescription for Ozempic 1 mg weekly.

## 2024-01-09 ENCOUNTER — Encounter: Payer: Self-pay | Admitting: Internal Medicine

## 2024-01-09 ENCOUNTER — Ambulatory Visit: Admitting: Internal Medicine

## 2024-01-09 VITALS — BP 120/80 | HR 78 | Ht 66.0 in | Wt 186.0 lb

## 2024-01-09 DIAGNOSIS — E782 Mixed hyperlipidemia: Secondary | ICD-10-CM

## 2024-01-09 DIAGNOSIS — E785 Hyperlipidemia, unspecified: Secondary | ICD-10-CM

## 2024-01-09 DIAGNOSIS — Z9189 Other specified personal risk factors, not elsewhere classified: Secondary | ICD-10-CM

## 2024-01-09 DIAGNOSIS — E1169 Type 2 diabetes mellitus with other specified complication: Secondary | ICD-10-CM | POA: Diagnosis not present

## 2024-01-09 DIAGNOSIS — E1159 Type 2 diabetes mellitus with other circulatory complications: Secondary | ICD-10-CM

## 2024-01-09 DIAGNOSIS — I10 Essential (primary) hypertension: Secondary | ICD-10-CM | POA: Diagnosis not present

## 2024-01-09 DIAGNOSIS — Z6830 Body mass index (BMI) 30.0-30.9, adult: Secondary | ICD-10-CM | POA: Diagnosis not present

## 2024-01-09 DIAGNOSIS — R911 Solitary pulmonary nodule: Secondary | ICD-10-CM

## 2024-01-09 DIAGNOSIS — Z0000 Encounter for general adult medical examination without abnormal findings: Secondary | ICD-10-CM | POA: Diagnosis not present

## 2024-01-09 LAB — COMPLETE METABOLIC PANEL WITHOUT GFR
AG Ratio: 2.1 (calc) (ref 1.0–2.5)
ALT: 19 U/L (ref 6–29)
AST: 23 U/L (ref 10–35)
Albumin: 4.4 g/dL (ref 3.6–5.1)
Alkaline phosphatase (APISO): 53 U/L (ref 37–153)
BUN: 19 mg/dL (ref 7–25)
CO2: 30 mmol/L (ref 20–32)
Calcium: 9.1 mg/dL (ref 8.6–10.4)
Chloride: 103 mmol/L (ref 98–110)
Creat: 0.86 mg/dL (ref 0.50–1.05)
Globulin: 2.1 g/dL (ref 1.9–3.7)
Glucose, Bld: 92 mg/dL (ref 65–99)
Potassium: 4.3 mmol/L (ref 3.5–5.3)
Sodium: 144 mmol/L (ref 135–146)
Total Bilirubin: 0.7 mg/dL (ref 0.2–1.2)
Total Protein: 6.5 g/dL (ref 6.1–8.1)

## 2024-01-09 LAB — MICROALBUMIN / CREATININE URINE RATIO
Creatinine, Urine: 13 mg/dL — ABNORMAL LOW (ref 20–275)
Microalb, Ur: <0.2 mg/dL

## 2024-01-09 LAB — POCT URINALYSIS DIP (CLINITEK)
Bilirubin, UA: NEGATIVE
Blood, UA: NEGATIVE
Glucose, UA: NEGATIVE mg/dL
Ketones, POC UA: NEGATIVE mg/dL
Leukocytes, UA: NEGATIVE
Nitrite, UA: NEGATIVE
POC PROTEIN,UA: NEGATIVE
Spec Grav, UA: 1.015 (ref 1.010–1.025)
Urobilinogen, UA: 0.2 U/dL
pH, UA: 6 (ref 5.0–8.0)

## 2024-01-09 LAB — CBC WITH DIFFERENTIAL/PLATELET
Absolute Lymphocytes: 1333 {cells}/uL (ref 850–3900)
Absolute Monocytes: 349 {cells}/uL (ref 200–950)
Basophils Absolute: 21 {cells}/uL (ref 0–200)
Basophils Relative: 0.5 %
Eosinophils Absolute: 189 {cells}/uL (ref 15–500)
Eosinophils Relative: 4.6 %
HCT: 36.7 % (ref 35.0–45.0)
Hemoglobin: 12.2 g/dL (ref 11.7–15.5)
MCH: 30.7 pg (ref 27.0–33.0)
MCHC: 33.2 g/dL (ref 32.0–36.0)
MCV: 92.4 fL (ref 80.0–100.0)
MPV: 11 fL (ref 7.5–12.5)
Monocytes Relative: 8.5 %
Neutro Abs: 2210 {cells}/uL (ref 1500–7800)
Neutrophils Relative %: 53.9 %
Platelets: 209 10*3/uL (ref 140–400)
RBC: 3.97 10*6/uL (ref 3.80–5.10)
RDW: 13.1 % (ref 11.0–15.0)
Total Lymphocyte: 32.5 %
WBC: 4.1 10*3/uL (ref 3.8–10.8)

## 2024-01-09 LAB — LIPID PANEL
Cholesterol: 171 mg/dL (ref <200)
HDL: 63 mg/dL (ref >=50)
LDL Cholesterol (Calc): 83 mg/dL
Non-HDL Cholesterol (Calc): 108 mg/dL (ref <130)
Total CHOL/HDL Ratio: 2.7 (calc) (ref <5.0)
Triglycerides: 150 mg/dL — ABNORMAL HIGH (ref <150)

## 2024-01-09 LAB — TSH: TSH: 0.73 m[IU]/L (ref 0.40–4.50)

## 2024-01-09 LAB — HEMOGLOBIN A1C
Hgb A1c MFr Bld: 6 % — ABNORMAL HIGH (ref <5.7)
Mean Plasma Glucose: 126 mg/dL
eAG (mmol/L): 7 mmol/L

## 2024-01-09 NOTE — Progress Notes (Signed)
 Annual Wellness Visit   Patient Care Team: Cordarro Spinnato, Ronal PARAS, MD as PCP - General (Internal Medicine) Lloyd Savannah, MD as Referring Physician (Dermatology) Izell Domino, MD as Attending Physician (Radiation Oncology) Alray Charlie LABOR, RN (Inactive) as Oncology Nurse Navigator  Visit Date: 01/09/24   Chief Complaint  Patient presents with   Medicare Wellness   Subjective:  Patient: Maria Mcpherson, Female DOB: 08/31/1955, 68 y.o. MRN: [id number] Vitals:   01/09/24 1457  BP: 120/80   Maria Mcpherson is a 68 y.o. Female who presents today for her Annual Wellness Visit. Patient has Hyperlipemia; Essential hypertension, benign; Fibrocystic breast disease; Dependent edema; Impaired glucose tolerance; Hypokalemia; Obesity; Metabolic syndrome; Status post total replacement of left hip; Hypersomnia; HTN (hypertension); Overweight; Dyspnea on exertion; OSA (obstructive sleep apnea); Basal cell carcinoma (BCC) of left side of nose; Kidney stone; and Unilateral primary osteoarthritis, right hip on their problem list.  History of Hypertension treated with Amlodipine  2.5 mg daily, Losartan  100 mg daily, and Metoprolol  tartrate 50 mg daily. Blood Pressure: normotensive today at 120/80. Dependent Edema treated with Lasix  40 mg daily.   History of Hyperlipidemia treated with Rosuvastatin  10 mg daily. 01/08/2024 Lipid Panel: Triglycerides 150, decreased from 181 in 03/2023, elevated from 124 in 08/2022.   History of Impaired Glucose Tolerance treated with Metformin  500 mg twice daily.  01/08/2024 HgbA1c 6.0, elevated from 5.6 in May, 5.7 in January, no change from 6.0 in 03/2023, decreased from 6.1 in 08/2022; Glucose 92;  Microalbumin/Creatinine, compared to 12/13/2023: Creatinine 13, decreased from 145; otherwise WNL. History of BMI 30, today weighs 186 lbsBMI 30.02, a 12 pound loss from 194 lbs 6.4 ozBMI 31.14 in 10/2022. Weightloss managed with Semaglutide  1 mg injected weekly. Eye exam UTD through [facility] - requesting records.   S/p LEFT TKA; Right Hip Pain has been followed by Dr. Vernetta. They have discussed RIGHT TKA, and she says that she would like to have this done as she has started limping, has had injection in right hip on 10/11/2023.  Labs 01/08/2024 CBC: WNL CMP: WNL  TSH: 0.73  PAP Smear 08/04/2020 normal without repeat recommendation.  Mammogram 10/30/2023  normal with repeat recommendation of 2026.  Colonoscopy 07/02/2021  removed four total sessile polyps 3-6 mm from rectum, transverse colon, and cecum (3 tubular adenomas negative for high-grade dysplasia and 1 hyperplastic polyp negative for high-grade dysplasia per pathology); Multiple Small & Larged-mouthed Diverticula found throughout the entire colon; Non-bleeding Internal Hemorrhoids on retroflexion with repeat recommendation of 2025.  Bone Density through [facility].   Vaccine Counseling: Due for Covid-19 - discussed, she has not had Covid-19 vaccine since 2022; UTD on Flu, PNA, and Tdap. Past Medical History:  Diagnosis Date   Arthritis    Bilateral swelling of legs and ankles    Borderline diabetes    Diabetes mellitus without complication (HCC)    Fibrocystic breast disease    Heart murmur    very slight   History of nonmelanoma skin cancer    HTN (hypertension)    Hx of blood clots    Hyperlipidemia    Hypokalemia    Prediabetes    pulmonary emboli 04/2005   same time as oophrectomy/myomectomy, evlauation was negative   Skin cancer    Sleep apnea    CPAP   Medical/Surgical History Narrative:   Allergic/Intolerant to:  Allergies  Allergen Reactions   Ampicillin Rash    Has patient had a PCN reaction causing immediate rash, facial/tongue/throat swelling, SOB or lightheadedness  with hypotension: Unsure Has patient had a PCN reaction causing severe rash involving mucus membranes or skin necrosis: No Has patient had a PCN reaction that required hospitalization No Has patient had a PCN reaction  occurring within the last 10 years: No If all of the above answers are NO, then may proceed with Cephalosporin use.   Past Surgical History:  Procedure Laterality Date   COLONOSCOPY  11/05/2009   OOPHORECTOMY  04/24/2005   only had one ovary removed unsure of rt or left   TONSILLECTOMY AND ADENOIDECTOMY  07/25/1961   TOTAL HIP ARTHROPLASTY Left 08/28/2015   Procedure: LEFT TOTAL HIP ARTHROPLASTY ANTERIOR APPROACH;  Surgeon: Lonni CINDERELLA Poli, MD;  Location: [facility];  Service: Orthopedics;  Laterality: Left;   Family History  Problem Relation Age of Onset   Stomach cancer Mother    Breast cancer Mother 56       mid 23s   Hypertension Mother    Hyperlipidemia Mother    Cancer Mother    Leukemia Father    Stroke Maternal Grandfather    Colon cancer Neg Hx    Esophageal cancer Neg Hx    Social History   Social History Narrative   Not on file   Review of Systems  Constitutional:  Negative for chills, fever, malaise/fatigue and weight loss.  HENT:  Negative for hearing loss, sinus pain and sore throat.   Respiratory:  Negative for cough, hemoptysis and shortness of breath.   Cardiovascular:  Negative for chest pain, palpitations, leg swelling and PND.  Gastrointestinal:  Negative for abdominal pain, constipation, diarrhea, heartburn, nausea and vomiting.  Genitourinary:  Negative for dysuria, frequency and urgency.  Musculoskeletal:  Positive for joint pain (right hip). Negative for back pain, myalgias and neck pain.  Skin:  Negative for itching and rash.  Neurological:  Negative for dizziness, tingling, seizures and headaches.  Endo/Heme/Allergies:  Negative for polydipsia.  Psychiatric/Behavioral:  Negative for depression. The patient is not nervous/anxious.     Objective:  Vitals: BP 120/80   Pulse 78   Ht 5' 6 (1.676 m)   Wt 186 lb (84.4 kg)   LMP 12/04/2013   SpO2 97%   BMI 30.02 kg/m  Physical Exam Vitals and nursing note reviewed.  Constitutional:       General: She is not in acute distress.    Appearance: Normal appearance. She is not ill-appearing or toxic-appearing.  HENT:     Head: Normocephalic and atraumatic.     Right Ear: Hearing, tympanic membrane, ear canal and external ear normal.     Left Ear: Hearing, tympanic membrane, ear canal and external ear normal.     Mouth/Throat:     Pharynx: Oropharynx is clear.   Eyes:     Extraocular Movements: Extraocular movements intact.     Pupils: Pupils are equal, round, and reactive to light.   Neck:     Thyroid : No thyroid  mass, thyromegaly or thyroid  tenderness.     Vascular: No carotid bruit.   Cardiovascular:     Rate and Rhythm: Normal rate and regular rhythm. No extrasystoles are present.    Pulses:          Dorsalis pedis pulses are 2+ on the right side and 2+ on the left side.     Heart sounds: Normal heart sounds. No murmur heard.    No friction rub. No gallop.  Pulmonary:     Effort: Pulmonary effort is normal.     Breath sounds: Normal breath  sounds. No decreased breath sounds, wheezing, rhonchi or rales.  Chest:     Chest wall: No mass.  Abdominal:     Palpations: Abdomen is soft. There is no hepatomegaly, splenomegaly or mass.     Tenderness: There is no abdominal tenderness.     Hernia: No hernia is present.  Genitourinary:    Adnexa: Right adnexa normal and left adnexa normal.     Rectum: Normal. Guaiac result negative.     Comments: NFEG  S/p unilateral oophorectomy, unsure which ovary, but bimanual pap normal  Musculoskeletal:     Cervical back: Normal range of motion.     Right lower leg: No edema.     Left lower leg: No edema.  Lymphadenopathy:     Cervical: No cervical adenopathy.     Upper Body:     Right upper body: No supraclavicular adenopathy.     Left upper body: No supraclavicular adenopathy.   Skin:    General: Skin is warm and dry.   Neurological:     General: No focal deficit present.     Mental Status: She is alert and oriented to  person, place, and time. Mental status is at baseline.     Sensory: Sensation is intact.     Motor: Motor function is intact. No weakness.     Deep Tendon Reflexes: Reflexes are normal and symmetric.   Psychiatric:        Attention and Perception: Attention normal.        Mood and Affect: Mood normal.        Speech: Speech normal.        Behavior: Behavior normal.        Thought Content: Thought content normal.        Cognition and Memory: Cognition normal.        Judgment: Judgment normal.   Most Recent Functional Status Assessment:    01/09/2024    2:58 PM  In your present state of health, do you have any difficulty performing the following activities:  Hearing? 0  Vision? 0  Difficulty concentrating or making decisions? 0  Walking or climbing stairs? 0  Dressing or bathing? 0  Doing errands, shopping? 0  Preparing Food and eating ? N  Using the Toilet? N  In the past six months, have you accidently leaked urine? N  Do you have problems with loss of bowel control? N  Managing your Medications? N  Managing your Finances? N  Housekeeping or managing your Housekeeping? N   Most Recent Fall Risk Assessment:    01/09/2024    2:58 PM  Fall Risk   Falls in the past year? 0  Number falls in past yr: 0  Injury with Fall? 0  Risk for fall due to : No Fall Risks  Follow up Falls prevention discussed;Education provided;Falls evaluation completed   Most Recent Depression Screenings:    01/09/2024    3:10 PM 10/06/2023   10:05 AM  PHQ 2/9 Scores  PHQ - 2 Score 0 0   Most Recent Cognitive Screening:    01/09/2024    2:59 PM  6CIT Screen  What Year? 0 points  What month? 0 points  What time? 0 points  Count back from 20 0 points  Months in reverse 0 points  Repeat phrase 0 points  Total Score 0 points   Results:  Studies Obtained And Personally Reviewed By Me:  PAP Smear 08/04/2020 normal without repeat recommendation.  Mammogram 10/30/2023  normal  with repeat  recommendation of 2026.  Colonoscopy 07/02/2021  removed four total sessile polyps 3-6 mm from rectum, transverse colon, and cecum (3 tubular adenomas negative for high-grade dysplasia and 1 hyperplastic polyp negative for high-grade dysplasia per pathology); Multiple Small & Larged-mouthed Diverticula found throughout the entire colon; Non-bleeding Internal Hemorrhoids on retroflexion with repeat recommendation of 2025.  Labs:     Component Value Date/Time   NA 144 01/08/2024 1222   K 4.3 01/08/2024 1222   CL 103 01/08/2024 1222   CO2 30 01/08/2024 1222   GLUCOSE 92 01/08/2024 1222   BUN 19 01/08/2024 1222   CREATININE 0.86 01/08/2024 1222   CALCIUM  9.1 01/08/2024 1222   PROT 6.5 01/08/2024 1222   ALBUMIN 4.2 03/23/2017 1056   AST 23 01/08/2024 1222   ALT 19 01/08/2024 1222   ALKPHOS 73 03/23/2017 1056   BILITOT 0.7 01/08/2024 1222   GFRNONAA 78 01/19/2021 1057   GFRAA 90 01/19/2021 1057    Lab Results  Component Value Date   WBC 4.1 01/08/2024   HGB 12.2 01/08/2024   HCT 36.7 01/08/2024   MCV 92.4 01/08/2024   PLT 209 01/08/2024   Lab Results  Component Value Date   CHOL 171 01/08/2024   HDL 63 01/08/2024   LDLCALC 83 01/08/2024   TRIG 150 (H) 01/08/2024   CHOLHDL 2.7 01/08/2024   Lab Results  Component Value Date   HGBA1C 6.0 (H) 01/08/2024    Lab Results  Component Value Date   TSH 0.73 01/08/2024    Assessment & Plan:   Orders Placed This Encounter  Procedures   POCT URINALYSIS DIP (CLINITEK)  Other Labs Reviewed today: CBC: WNL CMP: WNL  TSH: 0.73  Hypertension treated with Amlodipine  2.5 mg daily, Losartan  100 mg daily, and Metoprolol  tartrate 50 mg daily. Blood Pressure: normotensive today at 120/80.   Dependent Edema treated with Lasix  40 mg daily.   Hyperlipidemia treated with Rosuvastatin  10 mg daily. 01/08/2024 Lipid Panel: Triglycerides 150, decreased from 181 in 03/2023, elevated from 124 in 08/2022.   Impaired Glucose Tolerance treated with  Metformin  500 mg twice daily.  01/08/2024 HgbA1c 6.0, elevated from 5.6 in May, 5.7 in January, no change from 6.0 in 03/2023, decreased from 6.1 in 08/2022; Glucose 92;  Microalbumin/Creatinine, compared to 12/13/2023: Creatinine 13, decreased from 145; otherwise WNL. [facility] exam UTD through [facility] - requesting records.  BMI 30, today weighs 186 lbsBMI 30.02, a 12 pound loss from 194 lbs 6.4 ozBMI 31.14 in 10/2022. Weightloss managed with Semaglutide  1 mg injected weekly.    S/p LEFT TKA; Right Hip Pain has been followed by Dr. Vernetta. They have discussed RIGHT TKA, and she says that she would like to have this done as she has started limping, has had injection in right hip on 10/11/2023.  PAP Smear 08/04/2020 normal without repeat recommendation.   S/P Unilatetal Oophorectomy, unsure which, bimanual exam normal.  Mammogram 10/30/2023  normal with repeat recommendation of 2026.  Colonoscopy 07/02/2021  removed four total sessile polyps 3-6 mm from rectum, transverse colon, and cecum (3 tubular adenomas negative for high-grade dysplasia and 1 hyperplastic polyp negative for high-grade dysplasia per pathology); Multiple Small & Larged-mouthed Diverticula found throughout the entire colon; Non-bleeding Internal Hemorrhoids on retroflexion with repeat recommendation of 2025.  Bone Density through [facility].   Pulmonary nodule- seen initially on imaging for CT cardiac scoring 2 adjacent nodules measuring up to 6mm. Repeat study Sept 2024 showed stable 6mm subpleural left lower lobe  pulmonary nodule. We discussed repeating this study this Fall. Patient would like to defer this for now.She is low risk,I believe.  Vaccine Counseling: Due for Covid-19 - discussed, she has not had Covid-19 vaccine since 2022; UTD on Flu, PNA, and Tdap.   Annual wellness visit done today including the all of the following: Reviewed patient's Family Medical History Reviewed and updated list of patient's medical  providers Assessment of cognitive impairment was done Assessed patient's functional ability Established a written schedule for health screening services Health Risk Assessent Completed and Reviewed  Discussed health benefits of physical activity, and encouraged her to engage in regular exercise appropriate for her age and condition.    I,Emily Lagle,acting as a Neurosurgeon for Ronal JINNY Hailstone, MD.,have documented all relevant documentation on the behalf of Ronal JINNY Hailstone, MD,as directed by  Ronal JINNY Hailstone, MD while in the presence of Ronal JINNY Hailstone, MD.   I, Ronal JINNY Hailstone, MD, have reviewed all documentation for this visit. The documentation on 01/21/24 for the exam, diagnosis, procedures, and orders are all accurate and complete.

## 2024-01-11 ENCOUNTER — Ambulatory Visit: Admitting: Internal Medicine

## 2024-01-11 ENCOUNTER — Ambulatory Visit: Payer: Self-pay | Admitting: Internal Medicine

## 2024-01-17 ENCOUNTER — Ambulatory Visit: Admitting: Orthopaedic Surgery

## 2024-01-21 NOTE — Patient Instructions (Signed)
 Patient may return in one year or as needed. Is considering hip arthroplasty and may need Medical clearance for that procedure depending on when it is scheduled. No change in medications.

## 2024-01-23 NOTE — Pre-Procedure Instructions (Signed)
 Surgical Instructions   Your procedure is scheduled on Tuesday, July 15th. Report to [facility] Main Entrance A at 05:30 A.M., then check in with the Admitting office. Any questions or running late day of surgery: call [phone]  Questions prior to your surgery date: call [phone], Monday-Friday, 8am-4pm. If you experience any cold or flu symptoms such as cough, fever, chills, shortness of breath, etc. between now and your scheduled surgery, please notify us  at the above number.     Remember:  Do not eat after midnight the night before your surgery  You may drink clear liquids until 04:30 AM the morning of your surgery.   Clear liquids allowed are: Water, Non-Citrus Juices (without pulp), Carbonated Beverages, Clear Tea (no milk, honey, etc.), Black Coffee Only (NO MILK, CREAM OR POWDERED CREAMER of any kind), and Gatorade.  Patient Instructions  The night before surgery:  No food after midnight. ONLY clear liquids after midnight  The day of surgery (if you have diabetes): Drink ONE (1) 12 oz G2 given to you in your pre admission testing appointment by 04:30 AM the morning of surgery. Drink in one sitting. Do not sip.  This drink was given to you during your hospital  pre-op appointment visit.  Nothing else to drink after completing the  12 oz bottle of G2.         If you have questions, please contact your surgeon's office.    Take these medicines the morning of surgery with A SIP OF WATER  amLODipine  (NORVASC )  cetirizine (ZYRTEC)  metoprolol  tartrate (LOPRESSOR )  rosuvastatin  (CRESTOR )    One week prior to surgery, STOP taking any Aspirin  (unless otherwise instructed by your surgeon) Aleve, Naproxen, Ibuprofen, Motrin, Advil, Goody's, BC's, all herbal medications, fish oil, and non-prescription vitamins.  WHAT DO I DO ABOUT MY DIABETES MEDICATION?   Do not take metFORMIN  (GLUCOPHAGE ) the morning of surgery.  STOP taking Semaglutide  7 days prior to surgery.  Last dose on or before 7/7.     HOW TO MANAGE YOUR DIABETES BEFORE AND AFTER SURGERY  Why is it important to control my blood sugar before and after surgery? Improving blood sugar levels before and after surgery helps healing and can limit problems. A way of improving blood sugar control is eating a healthy diet by:  Eating less sugar and carbohydrates  Increasing activity/exercise  Talking with your doctor about reaching your blood sugar goals High blood sugars (greater than 180 mg/dL) can raise your risk of infections and slow your recovery, so you will need to focus on controlling your diabetes during the weeks before surgery. Make sure that the doctor who takes care of your diabetes knows about your planned surgery including the date and location.  How do I manage my blood sugar before surgery? Check your blood sugar at least 4 times a day, starting 2 days before surgery, to make sure that the level is not too high or low.  Check your blood sugar the morning of your surgery when you wake up and every 2 hours until you get to the Short Stay unit.  If your blood sugar is less than 70 mg/dL, you will need to treat for low blood sugar: Do not take insulin . Treat a low blood sugar (less than 70 mg/dL) with  cup of clear juice (cranberry or apple), 4 glucose tablets, OR glucose gel. Recheck blood sugar in 15 minutes after treatment (to make sure it is greater than 70 mg/dL). If your blood sugar is  not greater than 70 mg/dL on recheck, call [phone] for further instructions. Report your blood sugar to the short stay nurse when you get to Short Stay.  If you are admitted to the hospital after surgery: Your blood sugar will be checked by the staff and you will probably be given insulin  after surgery (instead of oral diabetes medicines) to make sure you have good blood sugar levels. The goal for blood sugar control after surgery is 80-180 mg/dL.                     Do NOT Smoke  (Tobacco/Vaping) for 24 hours prior to your procedure.  If you use a CPAP at night, you may bring your mask/headgear for your overnight stay.   You will be asked to remove any contacts, glasses, piercing's, hearing aid's, dentures/partials prior to surgery. Please bring cases for these items if needed.    Patients discharged the day of surgery will not be allowed to drive home, and someone needs to stay with them for 24 hours.  SURGICAL WAITING ROOM VISITATION Patients may have no more than 2 support people in the waiting area - these visitors may rotate.   Pre-op nurse will coordinate an appropriate time for 1 ADULT support person, who may not rotate, to accompany patient in pre-op.  Children under the age of 11 must have an adult with them who is not the patient and must remain in the main waiting area with an adult.  If the patient needs to stay at the hospital during part of their recovery, the visitor guidelines for inpatient rooms apply.  Please refer to the [facility] website for the visitor guidelines for any additional information.   If you received a COVID test during your pre-op visit  it is requested that you wear a mask when out in public, stay away from anyone that may not be feeling well and notify your surgeon if you develop symptoms. If you have been in contact with anyone that has tested positive in the last 10 days please notify you surgeon.      Pre-operative 5 CHG Bathing Instructions   You can play a key role in reducing the risk of infection after surgery. Your skin needs to be as free of germs as possible. You can reduce the number of germs on your skin by washing with CHG (chlorhexidine  gluconate) soap before surgery. CHG is an antiseptic soap that kills germs and continues to kill germs even after washing.   DO NOT use if you have an allergy to chlorhexidine /CHG or antibacterial soaps. If your skin becomes reddened or irritated, stop using the CHG and notify one  of our RNs at [phone].   Please shower with the CHG soap starting 4 days before surgery using the following schedule:     Please keep in mind the following:  DO NOT shave, including legs and underarms, starting the day of your first shower.   You may shave your face at any point before/day of surgery.  Place clean sheets on your bed the day you start using CHG soap. Use a clean washcloth (not used since being washed) for each shower. DO NOT sleep with pets once you start using the CHG.   CHG Shower Instructions:  Wash your face and private area with normal soap. If you choose to wash your hair, wash first with your normal shampoo.  After you use shampoo/soap, rinse your hair and body thoroughly to remove shampoo/soap residue.  Turn the water OFF and apply about 3 tablespoons (45 ml) of CHG soap to a CLEAN washcloth.  Apply CHG soap ONLY FROM YOUR NECK DOWN TO YOUR TOES (washing for 3-5 minutes)  DO NOT use CHG soap on face, private areas, open wounds, or sores.  Pay special attention to the area where your surgery is being performed.  If you are having back surgery, having someone wash your back for you may be helpful. Wait 2 minutes after CHG soap is applied, then you may rinse off the CHG soap.  Pat dry with a clean towel  Put on clean clothes/pajamas   If you choose to wear lotion, please use ONLY the CHG-compatible lotions that are listed below.  Additional instructions for the day of surgery: DO NOT APPLY any lotions, deodorants, cologne, or perfumes.   Do not bring valuables to the hospital. [facility] is not responsible for any belongings/valuables. Do not wear nail polish, gel polish, artificial nails, or any other type of covering on natural nails (fingers and toes) Do not wear jewelry or makeup Put on clean/comfortable clothes.  Please brush your teeth.  Ask your nurse before applying any prescription medications to the skin.     CHG Compatible Lotions   Aveeno  Moisturizing lotion  Cetaphil Moisturizing Cream  Cetaphil Moisturizing Lotion  Clairol Herbal Essence Moisturizing Lotion, Dry Skin  Clairol Herbal Essence Moisturizing Lotion, Extra Dry Skin  Clairol Herbal Essence Moisturizing Lotion, Normal Skin  Curel Age Defying Therapeutic Moisturizing Lotion with Alpha Hydroxy  Curel Extreme Care Body Lotion  Curel Soothing Hands Moisturizing Hand Lotion  Curel Therapeutic Moisturizing Cream, Fragrance-Free  Curel Therapeutic Moisturizing Lotion, Fragrance-Free  Curel Therapeutic Moisturizing Lotion, Original Formula  Eucerin Daily Replenishing Lotion  Eucerin Dry Skin Therapy Plus Alpha Hydroxy Crme  Eucerin Dry Skin Therapy Plus Alpha Hydroxy Lotion  Eucerin Original Crme  Eucerin Original Lotion  Eucerin Plus Crme Eucerin Plus Lotion  Eucerin TriLipid Replenishing Lotion  Keri Anti-Bacterial Hand Lotion  Keri Deep Conditioning Original Lotion Dry Skin Formula Softly Scented  Keri Deep Conditioning Original Lotion, Fragrance Free Sensitive Skin Formula  Keri Lotion Fast Absorbing Fragrance Free Sensitive Skin Formula  Keri Lotion Fast Absorbing Softly Scented Dry Skin Formula  Keri Original Lotion  Keri Skin Renewal Lotion Keri Silky Smooth Lotion  Keri Silky Smooth Sensitive Skin Lotion  Nivea Body Creamy Conditioning Oil  Nivea Body Extra Enriched Lotion  Nivea Body Original Lotion  Nivea Body Sheer Moisturizing Lotion Nivea Crme  Nivea Skin Firming Lotion  NutraDerm 30 Skin Lotion  NutraDerm Skin Lotion  NutraDerm Therapeutic Skin Cream  NutraDerm Therapeutic Skin Lotion  ProShield Protective Hand Cream  Provon moisturizing lotion  Please read over the following fact sheets that you were given.

## 2024-01-24 ENCOUNTER — Encounter (HOSPITAL_COMMUNITY)
Admission: RE | Admit: 2024-01-24 | Discharge: 2024-01-24 | Disposition: A | Discharge status: Home / Self Care | Source: Ambulatory Visit | Attending: Orthopaedic Surgery | Admitting: Orthopaedic Surgery

## 2024-01-24 ENCOUNTER — Encounter (HOSPITAL_COMMUNITY): Payer: Self-pay

## 2024-01-24 ENCOUNTER — Other Ambulatory Visit: Payer: Self-pay

## 2024-01-24 ENCOUNTER — Other Ambulatory Visit (HOSPITAL_COMMUNITY)

## 2024-01-24 VITALS — BP 147/93 | HR 76 | Temp 98.6°F | Resp 16 | Ht 66.0 in | Wt 183.4 lb

## 2024-01-24 DIAGNOSIS — E119 Type 2 diabetes mellitus without complications: Secondary | ICD-10-CM | POA: Diagnosis not present

## 2024-01-24 DIAGNOSIS — M1611 Unilateral primary osteoarthritis, right hip: Secondary | ICD-10-CM | POA: Diagnosis not present

## 2024-01-24 DIAGNOSIS — Z01812 Encounter for preprocedural laboratory examination: Secondary | ICD-10-CM | POA: Insufficient documentation

## 2024-01-24 DIAGNOSIS — Z01818 Encounter for other preprocedural examination: Secondary | ICD-10-CM

## 2024-01-24 LAB — BASIC METABOLIC PANEL WITH GFR
Anion gap: 10 (ref 5–15)
BUN: 22 mg/dL (ref 8–23)
CO2: 29 mmol/L (ref 22–32)
Calcium: 9.5 mg/dL (ref 8.9–10.3)
Chloride: 101 mmol/L (ref 98–111)
Creatinine, Ser: 0.9 mg/dL (ref 0.44–1.00)
GFR, Estimated: >60 mL/min (ref >60)
Glucose, Bld: 109 mg/dL — ABNORMAL HIGH (ref 70–99)
Potassium: 4.1 mmol/L (ref 3.5–5.1)
Sodium: 140 mmol/L (ref 135–145)

## 2024-01-24 LAB — CBC
HCT: 37.8 % (ref 36.0–46.0)
Hemoglobin: 12.3 g/dL (ref 12.0–15.0)
MCH: 30.4 pg (ref 26.0–34.0)
MCHC: 32.5 g/dL (ref 30.0–36.0)
MCV: 93.3 fL (ref 80.0–100.0)
Platelets: 236 10*3/uL (ref 150–400)
RBC: 4.05 MIL/uL (ref 3.87–5.11)
RDW: 12.9 % (ref 11.5–15.5)
WBC: 4.7 10*3/uL (ref 4.0–10.5)
nRBC: 0 % (ref 0.0–0.2)

## 2024-01-24 LAB — TYPE AND SCREEN
ABO/RH(D): A POS
Antibody Screen: NEGATIVE

## 2024-01-24 LAB — SURGICAL PCR SCREEN
MRSA, PCR: NEGATIVE
Staphylococcus aureus: NEGATIVE

## 2024-01-24 LAB — GLUCOSE, CAPILLARY: Glucose-Capillary: 98 mg/dL (ref 70–99)

## 2024-01-24 NOTE — Progress Notes (Addendum)
 PCP - Dr. Ronal Boozer  Cardiologist -   PPM/ICD - denies Device Orders - na Rep Notified - na  Chest x-ray - na EKG - PAT, 01/24/2024 Stress Test -  ECHO -  Cardiac Cath -   Sleep Study - OSA with nightly CPAP  Type II diabetic, Blood sugar 98 at PAT appointment.  A1C 6.0 01/08/2024 Fasting Blood Sugar - 95-105 Checks Blood Sugar: 3 times a week Metformin , hold day of surgery.   Last dose of GLP1 agonist-  Semaglutide  GLP1 instructions: Hold 7 days prior to surgery with last dose no than 01/29/2024  Blood Thinner Instructions: denies Aspirin  Instructions:denies  ERAS Protcol - G2 until 0430  Anesthesia review: Yes. HTN, OSA, heart murmur, DM.   [place] evaluates patient in PAT room to listen to heart murmur.   Patient denies shortness of breath, fever, cough and chest pain at PAT appointment   All instructions explained to the patient, with a verbal understanding of the material. Patient agrees to go over the instructions while at home for a better understanding. Patient also instructed to self quarantine after being tested for COVID-19. The opportunity to ask questions was provided.

## 2024-02-05 ENCOUNTER — Telehealth: Payer: Self-pay | Admitting: *Deleted

## 2024-02-05 NOTE — H&P (Signed)
 TOTAL HIP ADMISSION H&P  Patient is admitted for right total hip arthroplasty.  Subjective:  Chief Complaint: right hip pain  HPI: Maria Mcpherson, 68 y.o. female, has a history of pain and functional disability in the right hip(s) due to arthritis and patient has failed non-surgical conservative treatments for greater than 12 weeks to include NSAID's and/or analgesics, corticosteriod injections, flexibility and strengthening excercises, use of assistive devices, weight reduction as appropriate, and activity modification.  Onset of symptoms was gradual starting 2 years ago with gradually worsening course since that time.The patient noted no past surgery on the right hip(s).  Patient currently rates pain in the right hip at 10 out of 10 with activity. Patient has night pain, worsening of pain with activity and weight bearing, trendelenberg gait, pain that interfers with activities of daily living, and pain with passive range of motion. Patient has evidence of subchondral sclerosis, periarticular osteophytes, and joint space narrowing by imaging studies. This condition presents safety issues increasing the risk of falls.  There is no current active infection.  Patient Active Problem List   Diagnosis Date Noted   Unilateral primary osteoarthritis, right hip 05/29/2023   Kidney stone 11/12/2022   Basal cell carcinoma (BCC) of left side of nose 08/23/2019   OSA (obstructive sleep apnea) 01/28/2016   Hypersomnia 10/14/2015   HTN (hypertension) 10/14/2015   Overweight 10/14/2015   Dyspnea on exertion 10/14/2015   Status post total replacement of left hip 08/28/2015   Impaired glucose tolerance 10/21/2014   Hypokalemia 10/21/2014   Obesity 10/21/2014   Metabolic syndrome 10/21/2014   Dependent edema 10/03/2012   Fibrocystic breast disease 10/22/2011   Hyperlipemia 01/10/2011   Essential hypertension, benign 01/10/2011   Past Medical History:  Diagnosis Date   Arthritis    Bilateral swelling  of legs and ankles    Borderline diabetes    Diabetes mellitus without complication (HCC)    Fibrocystic breast disease    Heart murmur    very slight   History of nonmelanoma skin cancer    HTN (hypertension)    Hx of blood clots    Hyperlipidemia    Hypokalemia    Prediabetes    pulmonary emboli 04/2005   same time as oophrectomy/myomectomy, evlauation was negative   Skin cancer    Sleep apnea    CPAP    Past Surgical History:  Procedure Laterality Date   COLONOSCOPY  11/05/2009   OOPHORECTOMY  04/24/2005   only had one ovary removed unsure of rt or left   TONSILLECTOMY AND ADENOIDECTOMY  07/25/1961   TOTAL HIP ARTHROPLASTY Left 08/28/2015   Procedure: LEFT TOTAL HIP ARTHROPLASTY ANTERIOR APPROACH;  Surgeon: Lonni CINDERELLA Poli, MD;  Location: [facility];  Service: Orthopedics;  Laterality: Left;    No current facility-administered medications for this encounter.   Current Outpatient Medications  Medication Sig Dispense Refill Last Dose/Taking   amLODipine  (NORVASC ) 2.5 MG tablet Take 1 tablet (2.5 mg total) by mouth daily. 90 tablet 1 Taking   Calcium  Carb-Cholecalciferol (CALCIUM  600 + D PO) Take 1 tablet by mouth daily.   Taking   cetirizine (ZYRTEC) 10 MG tablet Take 10 mg by mouth daily.   Taking   furosemide  (LASIX ) 40 MG tablet Take 1 tablet (40 mg total) by mouth daily. 90 tablet 1 Taking   losartan  (COZAAR ) 100 MG tablet Take 1 tablet (100 mg total) by mouth daily. 90 tablet 1 Taking   metFORMIN  (GLUCOPHAGE ) 500 MG tablet Take 1 tablet (500 mg total)  by mouth 2 (two) times daily with a meal. 180 tablet 1 Taking   metoprolol  tartrate (LOPRESSOR ) 50 MG tablet Take 1 tablet (50 mg total) by mouth daily. 90 tablet 1 Taking   Multiple Vitamin (MULTIVITAMIN) capsule Take 1 capsule by mouth daily.   Taking   Omega-3 Fatty Acids (FISH OIL) 1200 MG CAPS Take 1,200 mg by mouth daily.   Taking   rosuvastatin  (CRESTOR ) 10 MG tablet Take 1 tablet (10 mg total) by mouth daily. 90  tablet 1 Taking   Semaglutide , 1 MG/DOSE, 4 MG/3ML SOPN Inject 1 mg as directed once a week. 3 mL 11 Taking   Accu-Chek Softclix Lancets lancets USE 1 DAILY AS DIRECTED 100 each 3    Blood Glucose Monitoring Suppl DEVI 1 each by Does not apply route in the morning, at noon, and at bedtime. May substitute to any manufacturer covered by patient's insurance. 1 each 0    Glucose Blood (BLOOD GLUCOSE TEST STRIPS) STRP 1 each by In Vitro route daily. May substitute to any manufacturer covered by patient's insurance. 100 each 3    Allergies  Allergen Reactions   Ampicillin Rash    Social History   Tobacco Use   Smoking status: Never   Smokeless tobacco: Never  Substance Use Topics   Alcohol  use: Not Currently    Comment: occasional    Family History  Problem Relation Age of Onset   Stomach cancer Mother    Breast cancer Mother 38       mid 82s   Hypertension Mother    Hyperlipidemia Mother    Cancer Mother    Leukemia Father    Stroke Maternal Grandfather    Colon cancer Neg Hx    Esophageal cancer Neg Hx      Review of Systems  Objective:  Physical Exam Vitals reviewed.  Constitutional:      Appearance: Normal appearance.  HENT:     Head: Normocephalic and atraumatic.  Eyes:     Extraocular Movements: Extraocular movements intact.     Pupils: Pupils are equal, round, and reactive to light.  Cardiovascular:     Rate and Rhythm: Normal rate and regular rhythm.  Pulmonary:     Effort: Pulmonary effort is normal.     Breath sounds: Normal breath sounds.  Abdominal:     Palpations: Abdomen is soft.  Musculoskeletal:     Cervical back: Normal range of motion and neck supple.     Right hip: Tenderness and bony tenderness present. Decreased range of motion. Decreased strength.  Neurological:     Mental Status: She is alert and oriented to person, place, and time.  Psychiatric:        Behavior: Behavior normal.     Vital signs in last 24 hours:     Labs:   Estimated body mass index is 29.6 kg/m as calculated from the following:   Height as of 01/24/24: 5' 6 (1.676 m).   Weight as of 01/24/24: 83.2 kg.   Imaging Review Plain radiographs demonstrate severe degenerative joint disease of the right hip(s). The bone quality appears to be good for age and reported activity level.      Assessment/Plan:  End stage arthritis, right hip(s)  The patient history, physical examination, clinical judgement of the provider and imaging studies are consistent with end stage degenerative joint disease of the right hip(s) and total hip arthroplasty is deemed medically necessary. The treatment options including medical management, injection therapy, arthroscopy and arthroplasty were  discussed at length. The risks and benefits of total hip arthroplasty were presented and reviewed. The risks due to aseptic loosening, infection, stiffness, dislocation/subluxation,  thromboembolic complications and other imponderables were discussed.  The patient acknowledged the explanation, agreed to proceed with the plan and consent was signed. Patient is being admitted for inpatient treatment for surgery, pain control, PT, OT, prophylactic antibiotics, VTE prophylaxis, progressive ambulation and ADL's and discharge planning.The patient is planning to be discharged home with home health services

## 2024-02-05 NOTE — Telephone Encounter (Signed)
 [facility] RNCM pre-op call completed for upcoming Right THA.

## 2024-02-05 NOTE — Care Plan (Signed)
 [facility] RNCM call to patient to discuss her upcoming Right total hip arthroplasty with Dr. Vernetta on 02/06/24 at [facility]. She is agreeable to case management. She lives with her spouse and will have assistance from him and her children after discharge to home. She has a RW already. Anticipate HHPT will be needed after a short hospital stay. Referral made to [facility] after choice provided. Reviewed post op care instructions. Will continue to follow for needs.

## 2024-02-05 NOTE — Anesthesia Preprocedure Evaluation (Signed)
 Anesthesia Evaluation  Patient identified by MRN, date of birth, ID band Patient awake    Reviewed: Allergy & Precautions, NPO status , Patient's Chart, lab work & pertinent test results  History of Anesthesia Complications Negative for: history of anesthetic complications  Airway Mallampati: I  TM Distance: >3 FB Neck ROM: Full    Dental  (+) Dental Advisory Given   Pulmonary sleep apnea and Continuous Positive Airway Pressure Ventilation    breath sounds clear to auscultation       Cardiovascular hypertension, Pt. on medications (-) angina  Rhythm:Regular Rate:Normal     Neuro/Psych negative neurological ROS     GI/Hepatic negative GI ROS, Neg liver ROS,,,  Endo/Other  diabetes (glu 100), Oral Hypoglycemic Agents  Semaglutide : 01/31/2024  Renal/GU Renal InsufficiencyRenal disease     Musculoskeletal  (+) Arthritis , Osteoarthritis,    Abdominal   Peds  Hematology Hb 12.3, plt 236k   Anesthesia Other Findings   Reproductive/Obstetrics                              Anesthesia Physical Anesthesia Plan  ASA: 3  Anesthesia Plan: Spinal   Post-op Pain Management: Tylenol  PO (pre-op)*   Induction:   PONV Risk Score and Plan: 2 and Ondansetron  and Treatment may vary due to age or medical condition  Airway Management Planned: Natural Airway and Simple Face Mask  Additional Equipment: None  Intra-op Plan:   Post-operative Plan:   Informed Consent: I have reviewed the patients History and Physical, chart, labs and discussed the procedure including the risks, benefits and alternatives for the proposed anesthesia with the patient or authorized representative who has indicated his/her understanding and acceptance.     Dental advisory given  Plan Discussed with: CRNA and Surgeon  Anesthesia Plan Comments:          Anesthesia Quick Evaluation

## 2024-02-06 ENCOUNTER — Observation Stay (HOSPITAL_COMMUNITY)

## 2024-02-06 ENCOUNTER — Ambulatory Visit (HOSPITAL_COMMUNITY): Payer: Self-pay | Admitting: Physician Assistant

## 2024-02-06 ENCOUNTER — Other Ambulatory Visit: Payer: Self-pay

## 2024-02-06 ENCOUNTER — Ambulatory Visit (HOSPITAL_COMMUNITY): Payer: Self-pay | Admitting: Anesthesiology

## 2024-02-06 ENCOUNTER — Ambulatory Visit (HOSPITAL_COMMUNITY)

## 2024-02-06 ENCOUNTER — Observation Stay (HOSPITAL_COMMUNITY)
Admission: RE | Admit: 2024-02-06 | Discharge: 2024-02-07 | Disposition: A | Discharge status: Home / Self Care | Attending: Orthopaedic Surgery | Admitting: Orthopaedic Surgery

## 2024-02-06 ENCOUNTER — Encounter (HOSPITAL_COMMUNITY): Payer: Self-pay | Admitting: Orthopaedic Surgery

## 2024-02-06 ENCOUNTER — Encounter (HOSPITAL_COMMUNITY)
Admission: RE | Disposition: A | Discharge status: Home / Self Care | Payer: Self-pay | Source: Home / Self Care | Attending: Orthopaedic Surgery

## 2024-02-06 DIAGNOSIS — G4730 Sleep apnea, unspecified: Secondary | ICD-10-CM | POA: Diagnosis not present

## 2024-02-06 DIAGNOSIS — I10 Essential (primary) hypertension: Secondary | ICD-10-CM | POA: Insufficient documentation

## 2024-02-06 DIAGNOSIS — E785 Hyperlipidemia, unspecified: Secondary | ICD-10-CM

## 2024-02-06 DIAGNOSIS — E119 Type 2 diabetes mellitus without complications: Secondary | ICD-10-CM | POA: Diagnosis not present

## 2024-02-06 DIAGNOSIS — Z7982 Long term (current) use of aspirin: Secondary | ICD-10-CM | POA: Insufficient documentation

## 2024-02-06 DIAGNOSIS — Z96641 Presence of right artificial hip joint: Secondary | ICD-10-CM | POA: Insufficient documentation

## 2024-02-06 DIAGNOSIS — M1611 Unilateral primary osteoarthritis, right hip: Secondary | ICD-10-CM

## 2024-02-06 DIAGNOSIS — G4733 Obstructive sleep apnea (adult) (pediatric): Secondary | ICD-10-CM

## 2024-02-06 HISTORY — PX: TOTAL HIP ARTHROPLASTY: SHX124

## 2024-02-06 LAB — GLUCOSE, CAPILLARY
Glucose-Capillary: 100 mg/dL — ABNORMAL HIGH (ref 70–99)
Glucose-Capillary: 98 mg/dL (ref 70–99)
Glucose-Capillary: 89 mg/dL (ref 70–99)
Glucose-Capillary: 101 mg/dL — ABNORMAL HIGH (ref 70–99)
Glucose-Capillary: 104 mg/dL — ABNORMAL HIGH (ref 70–99)

## 2024-02-06 SURGERY — ARTHROPLASTY, HIP, TOTAL, ANTERIOR APPROACH
Anesthesia: Spinal | Site: Hip | Laterality: Right

## 2024-02-06 MED ORDER — ALUM & MAG HYDROXIDE-SIMETH 200-200-20 MG/5ML PO SUSP: 30.0000 mL | ORAL | Status: DC | PRN

## 2024-02-06 MED ORDER — BUPIVACAINE IN DEXTROSE 0.75-8.25 % IT SOLN
INTRATHECAL | Status: DC | PRN
  Administered 2024-02-06: 12 mg via INTRATHECAL

## 2024-02-06 MED ORDER — ONDANSETRON HCL 4 MG/2ML IJ SOLN
INTRAMUSCULAR | Status: DC | PRN
  Administered 2024-02-06: 4 mg via INTRAVENOUS

## 2024-02-06 MED ORDER — ONDANSETRON HCL 4 MG/2ML IJ SOLN: 4.0000 mg | Freq: Four times a day (QID) | INTRAMUSCULAR | Status: DC | PRN

## 2024-02-06 MED ORDER — CEFAZOLIN SODIUM-DEXTROSE 2-4 GM/100ML-% IV SOLN
2.0000 g | Freq: Four times a day (QID) | INTRAVENOUS | Status: AC
  Administered 2024-02-06 (×2): 2 g via INTRAVENOUS
  Filled 2024-02-06 (×2): qty 100

## 2024-02-06 MED ORDER — LIDOCAINE 2% (20 MG/ML) 5 ML SYRINGE
INTRAMUSCULAR | Status: AC
  Filled 2024-02-06: qty 5

## 2024-02-06 MED ORDER — ORAL CARE MOUTH RINSE: 15.0000 mL | Freq: Once | OROMUCOSAL | Status: AC

## 2024-02-06 MED ORDER — HYDROMORPHONE HCL 1 MG/ML IJ SOLN
INTRAMUSCULAR | Status: AC
  Filled 2024-02-06: qty 1

## 2024-02-06 MED ORDER — EPHEDRINE SULFATE-NACL 50-0.9 MG/10ML-% IV SOSY
PREFILLED_SYRINGE | INTRAVENOUS | Status: DC | PRN
  Administered 2024-02-06: 15 mg via INTRAVENOUS
  Administered 2024-02-06: 10 mg via INTRAVENOUS

## 2024-02-06 MED ORDER — METHOCARBAMOL 1000 MG/10ML IJ SOLN: 500.0000 mg | Freq: Four times a day (QID) | INTRAMUSCULAR | Status: DC | PRN

## 2024-02-06 MED ORDER — AMLODIPINE BESYLATE 2.5 MG PO TABS
2.5000 mg | ORAL_TABLET | Freq: Every day | ORAL | Status: DC
  Administered 2024-02-07: 2.5 mg via ORAL
  Filled 2024-02-06: qty 1

## 2024-02-06 MED ORDER — PROPOFOL 10 MG/ML IV BOLUS
INTRAVENOUS | Status: DC | PRN
  Administered 2024-02-06 (×3): 20 mg via INTRAVENOUS

## 2024-02-06 MED ORDER — CHLORHEXIDINE GLUCONATE 0.12 % MT SOLN
15.0000 mL | Freq: Once | OROMUCOSAL | Status: AC
  Administered 2024-02-06: 15 mL via OROMUCOSAL
  Filled 2024-02-06: qty 15

## 2024-02-06 MED ORDER — CEFAZOLIN SODIUM-DEXTROSE 2-4 GM/100ML-% IV SOLN
2.0000 g | INTRAVENOUS | Status: AC
  Administered 2024-02-06: 2 g via INTRAVENOUS
  Filled 2024-02-06: qty 100

## 2024-02-06 MED ORDER — ACETAMINOPHEN 325 MG PO TABS: 325.0000 mg | ORAL_TABLET | Freq: Four times a day (QID) | ORAL | Status: DC | PRN

## 2024-02-06 MED ORDER — STERILE WATER FOR IRRIGATION IR SOLN
Status: DC | PRN
  Administered 2024-02-06: 1000 mL

## 2024-02-06 MED ORDER — LACTATED RINGERS IV SOLN: INTRAVENOUS | Status: DC

## 2024-02-06 MED ORDER — INSULIN ASPART 100 UNIT/ML IJ SOLN: 0.0000 [IU] | INTRAMUSCULAR | Status: DC | PRN

## 2024-02-06 MED ORDER — ACETAMINOPHEN 500 MG PO TABS
1000.0000 mg | ORAL_TABLET | Freq: Once | ORAL | Status: AC
  Administered 2024-02-06: 1000 mg via ORAL
  Filled 2024-02-06: qty 2

## 2024-02-06 MED ORDER — MEPERIDINE HCL 25 MG/ML IJ SOLN: 6.2500 mg | INTRAMUSCULAR | Status: DC | PRN

## 2024-02-06 MED ORDER — OXYCODONE HCL 5 MG PO TABS
10.0000 mg | ORAL_TABLET | ORAL | Status: DC | PRN
  Filled 2024-02-06: qty 2

## 2024-02-06 MED ORDER — ONDANSETRON HCL 4 MG PO TABS: 4.0000 mg | ORAL_TABLET | Freq: Four times a day (QID) | ORAL | Status: DC | PRN

## 2024-02-06 MED ORDER — POVIDONE-IODINE 10 % EX SWAB
2.0000 | Freq: Once | CUTANEOUS | Status: AC
Start: 2024-02-06 — End: 2024-02-06
  Administered 2024-02-06: 2 via TOPICAL

## 2024-02-06 MED ORDER — DIPHENHYDRAMINE HCL 12.5 MG/5ML PO ELIX: 12.5000 mg | ORAL_SOLUTION | ORAL | Status: DC | PRN

## 2024-02-06 MED ORDER — LOSARTAN POTASSIUM 50 MG PO TABS
100.0000 mg | ORAL_TABLET | Freq: Every day | ORAL | Status: DC
  Administered 2024-02-06: 100 mg via ORAL
  Filled 2024-02-06: qty 2

## 2024-02-06 MED ORDER — DOCUSATE SODIUM 100 MG PO CAPS
100.0000 mg | ORAL_CAPSULE | Freq: Two times a day (BID) | ORAL | Status: DC
  Administered 2024-02-06 – 2024-02-07 (×3): 100 mg via ORAL
  Filled 2024-02-06 (×2): qty 1

## 2024-02-06 MED ORDER — MENTHOL 3 MG MT LOZG: 1.0000 | LOZENGE | OROMUCOSAL | Status: DC | PRN

## 2024-02-06 MED ORDER — PANTOPRAZOLE SODIUM 40 MG PO TBEC
40.0000 mg | DELAYED_RELEASE_TABLET | Freq: Every day | ORAL | Status: DC
  Administered 2024-02-06 – 2024-02-07 (×2): 40 mg via ORAL
  Filled 2024-02-06: qty 1

## 2024-02-06 MED ORDER — PROPOFOL 500 MG/50ML IV EMUL
INTRAVENOUS | Status: DC | PRN
  Administered 2024-02-06: 50 ug/kg/min via INTRAVENOUS

## 2024-02-06 MED ORDER — METOCLOPRAMIDE HCL 5 MG PO TABS: 5.0000 mg | ORAL_TABLET | Freq: Three times a day (TID) | ORAL | Status: DC | PRN

## 2024-02-06 MED ORDER — TRANEXAMIC ACID-NACL 1000-0.7 MG/100ML-% IV SOLN
1000.0000 mg | INTRAVENOUS | Status: AC
  Administered 2024-02-06: 1000 mg via INTRAVENOUS
  Filled 2024-02-06: qty 100

## 2024-02-06 MED ORDER — METHOCARBAMOL 500 MG PO TABS
500.0000 mg | ORAL_TABLET | Freq: Four times a day (QID) | ORAL | Status: DC | PRN
  Administered 2024-02-06 – 2024-02-07 (×2): 500 mg via ORAL
  Filled 2024-02-06 (×3): qty 1

## 2024-02-06 MED ORDER — SODIUM CHLORIDE 0.9 % IV SOLN: INTRAVENOUS | Status: DC

## 2024-02-06 MED ORDER — FENTANYL CITRATE (PF) 250 MCG/5ML IJ SOLN
INTRAMUSCULAR | Status: AC
  Filled 2024-02-06: qty 5

## 2024-02-06 MED ORDER — OXYCODONE HCL 5 MG PO TABS: 5.0000 mg | ORAL_TABLET | Freq: Once | ORAL | Status: DC | PRN

## 2024-02-06 MED ORDER — ASPIRIN 81 MG PO CHEW
81.0000 mg | CHEWABLE_TABLET | Freq: Two times a day (BID) | ORAL | Status: DC
  Administered 2024-02-06 – 2024-02-07 (×2): 81 mg via ORAL
  Filled 2024-02-06 (×2): qty 1

## 2024-02-06 MED ORDER — HYDROMORPHONE HCL 1 MG/ML IJ SOLN: 0.5000 mg | INTRAMUSCULAR | Status: DC | PRN

## 2024-02-06 MED ORDER — METFORMIN HCL 500 MG PO TABS
500.0000 mg | ORAL_TABLET | Freq: Two times a day (BID) | ORAL | Status: DC
  Administered 2024-02-06 – 2024-02-07 (×2): 500 mg via ORAL
  Filled 2024-02-06 (×2): qty 1

## 2024-02-06 MED ORDER — HYDROMORPHONE HCL 1 MG/ML IJ SOLN
0.2500 mg | INTRAMUSCULAR | Status: DC | PRN
  Administered 2024-02-06: 0.5 mg via INTRAVENOUS

## 2024-02-06 MED ORDER — 0.9 % SODIUM CHLORIDE (POUR BTL) OPTIME
TOPICAL | Status: DC | PRN
  Administered 2024-02-06: 1000 mL

## 2024-02-06 MED ORDER — METOCLOPRAMIDE HCL 5 MG/ML IJ SOLN: 5.0000 mg | Freq: Three times a day (TID) | INTRAMUSCULAR | Status: DC | PRN

## 2024-02-06 MED ORDER — FUROSEMIDE 40 MG PO TABS
40.0000 mg | ORAL_TABLET | Freq: Every day | ORAL | Status: DC
  Administered 2024-02-06: 40 mg via ORAL
  Filled 2024-02-06: qty 1

## 2024-02-06 MED ORDER — PHENOL 1.4 % MT LIQD: 1.0000 | OROMUCOSAL | Status: DC | PRN

## 2024-02-06 MED ORDER — OXYCODONE HCL 5 MG PO TABS
5.0000 mg | ORAL_TABLET | ORAL | Status: DC | PRN
  Administered 2024-02-06 – 2024-02-07 (×4): 10 mg via ORAL
  Filled 2024-02-06 (×3): qty 2

## 2024-02-06 MED ORDER — PROPOFOL 10 MG/ML IV BOLUS
INTRAVENOUS | Status: AC
Start: 2024-02-06 — End: 2024-02-06
  Filled 2024-02-06: qty 20

## 2024-02-06 MED ORDER — METOPROLOL TARTRATE 50 MG PO TABS: 50.0000 mg | ORAL_TABLET | Freq: Every day | ORAL | Status: DC

## 2024-02-06 MED ORDER — ROSUVASTATIN CALCIUM 5 MG PO TABS
10.0000 mg | ORAL_TABLET | Freq: Every day | ORAL | Status: DC
Start: 2024-02-07 — End: 2024-02-07

## 2024-02-06 MED ORDER — MIDAZOLAM HCL 2 MG/2ML IJ SOLN
INTRAMUSCULAR | Status: AC
  Filled 2024-02-06: qty 2

## 2024-02-06 MED ORDER — MIDAZOLAM HCL 2 MG/2ML IJ SOLN
INTRAMUSCULAR | Status: DC | PRN
  Administered 2024-02-06 (×4): 1 mg via INTRAVENOUS

## 2024-02-06 MED ORDER — SODIUM CHLORIDE 0.9 % IR SOLN
Status: DC | PRN
  Administered 2024-02-06: 3000 mL

## 2024-02-06 MED ORDER — MIDAZOLAM HCL 2 MG/2ML IJ SOLN: 0.5000 mg | Freq: Once | INTRAMUSCULAR | Status: DC | PRN

## 2024-02-06 MED ORDER — FENTANYL CITRATE (PF) 250 MCG/5ML IJ SOLN
INTRAMUSCULAR | Status: DC | PRN
  Administered 2024-02-06 (×2): 50 ug via INTRAVENOUS

## 2024-02-06 MED ORDER — OXYCODONE HCL 5 MG/5ML PO SOLN: 5.0000 mg | Freq: Once | ORAL | Status: DC | PRN

## 2024-02-06 SURGICAL SUPPLY — 44 items
BAG COUNTER SPONGE SURGICOUNT (BAG) ×1 IMPLANT
BENZOIN TINCTURE PRP APPL 2/3 (GAUZE/BANDAGES/DRESSINGS) ×1 IMPLANT
BLADE CLIPPER SURG (BLADE) IMPLANT
BLADE SAW SGTL 18X1.27X75 (BLADE) ×1 IMPLANT
COVER SURGICAL LIGHT HANDLE (MISCELLANEOUS) ×1 IMPLANT
CUP ACETAB W/GRIPTION 54 (Plate) IMPLANT
DRAPE C-ARM 42X72 X-RAY (DRAPES) ×1 IMPLANT
DRAPE STERI IOBAN 125X83 (DRAPES) ×1 IMPLANT
DRAPE U-SHAPE 47X51 STRL (DRAPES) ×3 IMPLANT
DRESSING AQUACEL AG SP 3.5X10 (GAUZE/BANDAGES/DRESSINGS) IMPLANT
DRSG AQUACEL AG ADV 3.5X10 (GAUZE/BANDAGES/DRESSINGS) ×1 IMPLANT
DURAPREP 26ML APPLICATOR (WOUND CARE) ×1 IMPLANT
ELECT BLADE 6.5 EXT (BLADE) IMPLANT
ELECTRODE BLDE 4.0 EZ CLN MEGD (MISCELLANEOUS) ×1 IMPLANT
ELECTRODE REM PT RTRN 9FT ADLT (ELECTROSURGICAL) ×1 IMPLANT
FACESHIELD WRAPAROUND (MASK) ×2 IMPLANT
FACESHIELD WRAPAROUND OR TEAM (MASK) ×2 IMPLANT
FEMORAL STEM 12/14 TPR SZ4 HIP (Orthopedic Implant) IMPLANT
GLOVE BIOGEL PI IND STRL 8 (GLOVE) ×2 IMPLANT
GLOVE ECLIPSE 8.0 STRL XLNG CF (GLOVE) ×1 IMPLANT
GLOVE ORTHO TXT STRL SZ7.5 (GLOVE) ×2 IMPLANT
GOWN STRL REUS W/ TWL LRG LVL3 (GOWN DISPOSABLE) ×2 IMPLANT
GOWN STRL REUS W/ TWL XL LVL3 (GOWN DISPOSABLE) ×2 IMPLANT
HEAD CERAMIC DELTA 36 PLUS 1.5 (Hips) IMPLANT
KIT BASIN OR (CUSTOM PROCEDURE TRAY) ×1 IMPLANT
KIT TURNOVER KIT B (KITS) ×1 IMPLANT
LINER NEUTRAL 36ID 54OD (Liner) IMPLANT
MANIFOLD NEPTUNE II (INSTRUMENTS) ×1 IMPLANT
NS IRRIG 1000ML POUR BTL (IV SOLUTION) ×1 IMPLANT
PACK TOTAL JOINT (CUSTOM PROCEDURE TRAY) ×1 IMPLANT
PAD ARMBOARD POSITIONER FOAM (MISCELLANEOUS) ×1 IMPLANT
SET HNDPC FAN SPRY TIP SCT (DISPOSABLE) ×1 IMPLANT
STAPLER SKIN PROX 35W (STAPLE) IMPLANT
STRIP CLOSURE SKIN 1/2X4 (GAUZE/BANDAGES/DRESSINGS) ×2 IMPLANT
SUT ETHIBOND NAB CT1 #1 30IN (SUTURE) ×1 IMPLANT
SUT MNCRL AB 4-0 PS2 18 (SUTURE) IMPLANT
SUT VIC AB 0 CT1 27XBRD ANBCTR (SUTURE) ×1 IMPLANT
SUT VIC AB 1 CT1 27XBRD ANBCTR (SUTURE) ×1 IMPLANT
SUT VIC AB 2-0 CT1 TAPERPNT 27 (SUTURE) ×1 IMPLANT
TOWEL GREEN STERILE (TOWEL DISPOSABLE) ×1 IMPLANT
TOWEL GREEN STERILE FF (TOWEL DISPOSABLE) ×1 IMPLANT
TRAY CATH INTERMITTENT SS 16FR (CATHETERS) IMPLANT
TRAY FOLEY W/BAG SLVR 16FR ST (SET/KITS/TRAYS/PACK) IMPLANT
WATER STERILE IRR 1000ML POUR (IV SOLUTION) ×2 IMPLANT

## 2024-02-06 NOTE — Op Note (Signed)
 Operative Note  Date of operation: 02/06/2024 Preoperative diagnosis: Right hip primary osteoarthritis Postoperative diagnosis: Same  Procedure: Right direct anterior total hip arthroplasty  Implants: Implant Name Type Inv. Item Serial No. Manufacturer Lot No. LRB No. Used Action  CUP ACETAB W/GRIPTION 54 - [id number] Plate CUP ACETAB W/GRIPTION 54  [employer] [id number] Right 1 Implanted  LINER NEUTRAL 36ID 54OD - [id number] Liner LINER NEUTRAL 36ID 54OD  [employer]PU[employer]TH[employer] [id number] Right 1 Implanted  FEMORAL STEM 12/14 TPR SZ4 HIP - [id number] Orthopedic Implant FEMORAL STEM 12/14 TPR SZ4 HIP  [employer] [id number] Right 1 Implanted  HEAD CERAMIC DELTA 36 PLUS 1.5 - [id number] Hips HEAD CERAMIC DELTA 36 PLUS 1.5  DEPUY [employer]TH[employer] [id number] Right 1 Implanted   Surgeon: Lonni GRADE. Vernetta, MD Assistant: Tory Gaskins, PA-C  Anesthesia: Spinal Antibiotics: IV Ancef  EBL: 100 cc Complications: None  Indications: Patient is a 68 year old female with debilitating arthritis involving her right hip.  This has been well-documented with clinical exam and x-ray findings.  She has tried and failed conservative treatment for over a year now.  Her right hip pain has become daily and it is detrimentally affecting her mobility, quality of life and her actives daily living to the point she does wish proceed with a hip replacement on the right side.  We have to replaced her left hip successfully around 2017 and that is done well.  Having had hip placement surgery before she is fully aware of the risks of acute blood loss anemia, neurovascular injury, fracture, infection, DVT, dislocation, implant failure, leg length differences and wound healing issues.  She understands that our goals are hopefully decreased pain, improved mobility, and improve quality of life.  Procedure description: After informed consent was obtained and appropriate right hip was marked, the patient was brought to the  operating room and set up on the stretcher where spinal anesthesia was obtained.  She was laid in the spine just on the stretcher and a Foley catheter was placed.  Traction boots were placed on both her feet and next she is placed supine on the Hana fracture table with a perineal post in place and both legs in inline skeletal traction vices no traction applied.  Her right operative hip and pelvis were assessed radiographically.  The right hip was prepped and draped with DuraPrep and sterile drapes.  A timeout was called and she was identified as the correct patient and the correct right hip.  An incision was then made just inferior and posterior to the ASIS and carried slightly obliquely down the leg.  Dissection was carried down to the tensor fascia lata muscle and the tensor fascia was divided longitudinally to proceed with a direct anterior approach to the hip.  Circumflex vessels were identified and cauterized.  The was identified and opened up in L-type format finding a moderate joint effusion.  Cobra retractors were placed around the medial and lateral femoral neck and a femoral neck cut was made with an oscillating saw just proximal to the lesser trochanter and this cut was completed with an osteotome.  A corkscrew guide was placed in the femoral head and femoral head removed in its entirety and was a wide area devoid of cartilage.  A bent Hohmann was then placed over the medial acetabular rim and remnants of the acetabular labrum and other debris were removed.  Reaming was then initiated using the DePuy reaming system for a size 43 reamer going up to a size 53 reamer with all reamers placed  under direct visualization and the last reamer also placed under direct fluoroscopy in order to obtain the depth reaming, the inclination and the anteversion.  The real DePuy sector GRIPTION acetabular component size 54 was then placed without difficulty followed by a 36+0 polythene liner.  Attention was then turned to  the femur.  With the right leg externally rotated to 120 degrees, extended and adducted, annular retractors placed medially and a Hohmann tract around the greater trochanter.  The lateral joint capsule was released and a box cutting osteotome was used into the femoral canal.  Broaching was initiated from a size 0 broach and stepwise and remains going up to a size 4 broach.  With a 4 broach in place we trialed a standard offset femoral neck and a 36+1.5 trial head ball.  The right leg was brought over and up and with traction and internal rotation reduced pelvis.  Based on radiographic and clinical assessment we felt like we needed more offset by her leg lengths we were pleased with.  We dislocated the hip and removed the trial components.  We then placed the real Actis femoral component with high offset size 4 and with the real 36+1.5 ceramic head ball.  This was again reduced in the pelvis we are pleased with range of motion, offset and stability as well as ligament.  The soft tissue was then irrigated normal saline solution.  Remnants of the joint capsule were closed with interrupted #1 Ethibond suture followed by #1 Vicryl close the tensor fascia.  0 Vicryl was used to close deep tissue and 2-0 Vicryl was used to close subcutaneous tissue.  Skin was closed with staples.  An Aquacel dressing was applied.  Patient was taken recovery room in stable condition.  Tory Gaskins, PA-C did assist in interrogation beginning end and his assistance was crucial and medically necessary for soft tissue management and retraction, helping guide implant placement and a layered closure of the wound.

## 2024-02-06 NOTE — Anesthesia Procedure Notes (Signed)
 Spinal  End time: 02/06/2024 7:35 AM Staffing Performed: anesthesiologist  Anesthesiologist: Leonce Athens, MD Performed by: Leonce Athens, MD Authorized by: Leonce Athens, MD   Preanesthetic Checklist Completed: patient identified, IV checked, site marked, risks and benefits discussed, surgical consent, monitors and equipment checked, pre-op evaluation and timeout performed Spinal Block Patient position: sitting Prep: site prepped and draped Patient monitoring: blood pressure, continuous pulse ox, cardiac monitor and heart rate Approach: midline Location: L3-4 Injection technique: single-shot Needle Needle type: Pencan and Introducer  Needle gauge: 24 G Needle length: 9 cm Assessment Events: CSF return Additional Notes Pt identified in Operating room.  Monitors applied. Working IV access confirmed. Sterile prep, drape lumbar spine.  1% lido local L 3,4.  #24ga Pencan into clear CSF L 3,4.  12mg  0.75% Bupivacaine  with dextrose  injected with asp CSF beginning and end of injection.  Patient asymptomatic, VSS, no heme aspirated, tolerated well.  Maria Mcpherson Leonce, MD

## 2024-02-06 NOTE — Plan of Care (Signed)

## 2024-02-06 NOTE — Interval H&P Note (Signed)
 History and Physical Interval Note: The patient understands that she is here today for a right total hip replacement to treat her significant right hip pain and arthritis.  There has been no acute or interval change in her medical status.  The risks and benefits of surgery have been discussed in detail and informed consent has been obtained.  The right operative hip has been marked.  02/06/2024 7:15 AM  Maria Mcpherson  has presented today for surgery, with the diagnosis of Osteoarthritis Right Hip.  The various methods of treatment have been discussed with the patient and family. After consideration of risks, benefits and other options for treatment, the patient has consented to  Procedure(s): ARTHROPLASTY, HIP, TOTAL, ANTERIOR APPROACH (Right) as a surgical intervention.  The patient's history has been reviewed, patient examined, no change in status, stable for surgery.  I have reviewed the patient's chart and labs.  Questions were answered to the patient's satisfaction.     Lonni CINDERELLA Poli

## 2024-02-06 NOTE — Transfer of Care (Signed)
 Immediate Anesthesia Transfer of Care Note  Patient: Maria Mcpherson  Procedure(s) Performed: ARTHROPLASTY, HIP, TOTAL, ANTERIOR APPROACH (Right: Hip)  Patient Location: PACU  Anesthesia Type:MAC, Spinal, and MAC combined with regional for post-op pain  Level of Consciousness: awake, alert , oriented, patient cooperative, and responds to stimulation  Airway & Oxygen Therapy: Patient Spontanous Breathing  Post-op Assessment: Report given to RN and Post -op Vital signs reviewed and stable  Post vital signs: Reviewed and stable  Last Vitals:  Vitals Value Taken Time  BP 113/74 02/06/24 09:30  Temp    Pulse 68 02/06/24 09:31  Resp 19 02/06/24 09:31  SpO2 95 % 02/06/24 09:31  Vitals shown include unfiled device data.  Last Pain:  Vitals:   02/06/24 0631  TempSrc:   PainSc: 5       Patients Stated Pain Goal: 1 (02/06/24 0626)  Complications: No notable events documented.

## 2024-02-06 NOTE — Evaluation (Signed)
 Physical Therapy Evaluation Patient Details Name: Maria Mcpherson MRN: [id number] DOB: 13-Oct-1955 Today's Date: 02/06/2024  History of Present Illness  68 y.o. female presents to [facility] hospital on 02/06/2024 for elective R THA. PMH includes L THA, HLD, HTN, OSA, basal cell carcinoma.  Clinical Impression  Pt presents to PT with deficits in functional mobility, gait, balance, strength, ROM. Pt is able to ambulate for household distances with support of the RW. PT provides education on the THA exercise packet and encourages frequent mobilization with staff assistance. PT will follow up tomorrow for a progression of gait and to address any other concerns.        If plan is discharge home, recommend the following: A little help with bathing/dressing/bathroom;Assistance with cooking/housework;Assist for transportation   Can travel by private vehicle        Equipment Recommendations None recommended by PT  Recommendations for Other Services       Functional Status Assessment Patient has had a recent decline in their functional status and demonstrates the ability to make significant improvements in function in a reasonable and predictable amount of time.     Precautions / Restrictions Precautions Precautions: Fall Recall of Precautions/Restrictions: Intact Precaution/Restrictions Comments: direct anterior hip replacement Restrictions Weight Bearing Restrictions Per Provider Order: Yes RLE Weight Bearing Per Provider Order: Weight bearing as tolerated      Mobility  Bed Mobility Overal bed mobility: Needs Assistance Bed Mobility: Supine to Sit, Sit to Supine     Supine to sit: Supervision, HOB elevated Sit to supine: Min assist        Transfers Overall transfer level: Needs assistance Equipment used: Rolling walker (2 wheels) Transfers: Sit to/from Stand Sit to Stand: Supervision                Ambulation/Gait Ambulation/Gait assistance: Supervision Gait Distance  (Feet): 150 Feet Assistive device: Rolling walker (2 wheels) Gait Pattern/deviations: Step-through pattern Gait velocity: reduced Gait velocity interpretation: 1.31 - 2.62 ft/sec, indicative of limited community ambulator   General Gait Details: steady step-through Insurance risk surveyor     Tilt Bed    Modified Rankin (Stroke Patients Only)       Balance Overall balance assessment: Needs assistance Sitting-balance support: No upper extremity supported, Feet supported Sitting balance-Leahy Scale: Good     Standing balance support: Single extremity supported, Reliant on assistive device for balance Standing balance-Leahy Scale: Poor                               Pertinent Vitals/Pain Pain Assessment Pain Assessment: 0-10 Pain Score: 4  Pain Location: R hip incision Pain Descriptors / Indicators: Sore Pain Intervention(s): Monitored during session    Home Living Family/patient expects to be discharged to:: Private residence Living Arrangements: Spouse/significant other Available Help at Discharge: Family;Available PRN/intermittently Type of Home: House Home Access: Level entry       Home Layout: Multi-level;Able to live on main level with bedroom/bathroom Home Equipment: Rolling Walker (2 wheels);Shower seat;Grab bars - tub/shower      Prior Function Prior Level of Function : Independent/Modified Independent;Working/employed;Driving             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 Upper Extremity Assessment: Overall WFL for tasks assessed    Lower Extremity Assessment Lower Extremity  Assessment: RLE deficits/detail RLE Deficits / Details: generalized post-op weakness as anticipated on POD 0    Cervical / Trunk Assessment Cervical / Trunk Assessment: Normal  Communication   Communication Communication: No apparent  difficulties    Cognition Arousal: Alert Behavior During Therapy: WFL for tasks assessed/performed   PT - Cognitive impairments: No apparent impairments                         Following commands: Intact       Cueing Cueing Techniques: Verbal cues     General Comments General comments (skin integrity, edema, etc.): VSS on RA    Exercises Other Exercises Other Exercises: PT provides education on surgical hip exercise packet   Assessment/Plan    PT Assessment Patient needs continued PT services  PT Problem List Decreased strength;Decreased activity tolerance;Decreased balance;Decreased mobility;Pain       PT Treatment Interventions DME instruction;Stair training;Functional mobility training;Gait training;Therapeutic activities;Therapeutic exercise;Balance training;Patient/family education    PT Goals (Current goals can be found in the Care Plan section)  Acute Rehab PT Goals Patient Stated Goal: to return to independence PT Goal Formulation: With patient Time For Goal Achievement: 02/10/24 Potential to Achieve Goals: Good    Frequency 7X/week     Co-evaluation               AM-PAC PT 6 Clicks Mobility  Outcome Measure Help needed turning from your back to your side while in a flat bed without using bedrails?: A Little Help needed moving from lying on your back to sitting on the side of a flat bed without using bedrails?: A Little Help needed moving to and from a bed to a chair (including a wheelchair)?: A Little Help needed standing up from a chair using your arms (e.g., wheelchair or bedside chair)?: A Little Help needed to walk in hospital room?: A Little Help needed climbing 3-5 steps with a railing? : A Little 6 Click Score: 18    End of Session Equipment Utilized During Treatment: Gait belt Activity Tolerance: Patient tolerated treatment well Patient left: in bed;with call bell/phone within reach Nurse Communication: Mobility status PT  Visit Diagnosis: Other abnormalities of gait and mobility (R26.89);Muscle weakness (generalized) (M62.81);Pain Pain - Right/Left: Right Pain - part of body: Hip    Time: 1224-1250 PT Time Calculation (min) (ACUTE ONLY): 26 min   Charges:   PT Evaluation $PT Eval Low Complexity: 1 Low   PT General Charges $$ ACUTE PT VISIT: 1 Visit         Bernardino JINNY Ruth, PT, DPT Acute Rehabilitation Office [phone]   Bernardino JINNY Ruth 02/06/2024, 2:27 PM

## 2024-02-06 NOTE — Anesthesia Postprocedure Evaluation (Signed)
 Anesthesia Post Note  Patient: Maria Mcpherson  Procedure(s) Performed: ARTHROPLASTY, HIP, TOTAL, ANTERIOR APPROACH (Right: Hip)     Patient location during evaluation: PACU Anesthesia Type: Spinal Level of consciousness: oriented, awake and alert and patient cooperative Pain management: pain level controlled Vital Signs Assessment: post-procedure vital signs reviewed and stable Respiratory status: spontaneous breathing, nonlabored ventilation and respiratory function stable Cardiovascular status: blood pressure returned to baseline and stable Postop Assessment: no backache, spinal receding and no apparent nausea or vomiting Anesthetic complications: no   No notable events documented.  Last Vitals:  Vitals:   02/06/24 1015 02/06/24 1044  BP: (!) 149/78 (!) 146/83  Pulse: (!) 58 (!) 57  Resp: 20 20  Temp:  36.5 C  SpO2: 96% 96%    Last Pain:  Vitals:   02/06/24 1019  TempSrc:   PainSc: 7                  Maria Mcpherson,E. Columbia Pandey

## 2024-02-07 ENCOUNTER — Encounter (HOSPITAL_COMMUNITY): Payer: Self-pay | Admitting: Orthopaedic Surgery

## 2024-02-07 ENCOUNTER — Other Ambulatory Visit: Payer: Self-pay

## 2024-02-07 DIAGNOSIS — M1611 Unilateral primary osteoarthritis, right hip: Secondary | ICD-10-CM | POA: Diagnosis not present

## 2024-02-07 LAB — CBC
HCT: 36.7 % (ref 36.0–46.0)
Hemoglobin: 12.3 g/dL (ref 12.0–15.0)
MCH: 30.7 pg (ref 26.0–34.0)
MCHC: 33.5 g/dL (ref 30.0–36.0)
MCV: 91.5 fL (ref 80.0–100.0)
Platelets: 237 K/uL (ref 150–400)
RBC: 4.01 MIL/uL (ref 3.87–5.11)
RDW: 12.4 % (ref 11.5–15.5)
WBC: 11.1 K/uL — ABNORMAL HIGH (ref 4.0–10.5)
nRBC: 0 % (ref 0.0–0.2)

## 2024-02-07 LAB — BASIC METABOLIC PANEL WITH GFR
Anion gap: 16 — ABNORMAL HIGH (ref 5–15)
BUN: 15 mg/dL (ref 8–23)
CO2: 26 mmol/L (ref 22–32)
Calcium: 8.7 mg/dL — ABNORMAL LOW (ref 8.9–10.3)
Chloride: 95 mmol/L — ABNORMAL LOW (ref 98–111)
Creatinine, Ser: 1.45 mg/dL — ABNORMAL HIGH (ref 0.44–1.00)
GFR, Estimated: 40 mL/min — ABNORMAL LOW (ref >60)
Glucose, Bld: 156 mg/dL — ABNORMAL HIGH (ref 70–99)
Potassium: 4 mmol/L (ref 3.5–5.1)
Sodium: 137 mmol/L (ref 135–145)

## 2024-02-07 LAB — GLUCOSE, CAPILLARY: Glucose-Capillary: 132 mg/dL — ABNORMAL HIGH (ref 70–99)

## 2024-02-07 MED ORDER — METHOCARBAMOL 500 MG PO TABS: 500.0000 mg | ORAL_TABLET | Freq: Four times a day (QID) | ORAL | 1 refills | Status: DC | PRN

## 2024-02-07 MED ORDER — OXYCODONE HCL 5 MG PO TABS: 5.0000 mg | ORAL_TABLET | Freq: Four times a day (QID) | ORAL | 0 refills | Status: DC | PRN

## 2024-02-07 MED ORDER — ASPIRIN 81 MG PO CHEW: 81.0000 mg | CHEWABLE_TABLET | Freq: Two times a day (BID) | ORAL | 0 refills | Status: DC

## 2024-02-07 NOTE — Progress Notes (Signed)
 Physical Therapy Treatment Patient Details Name: Maria Mcpherson MRN: [id number] DOB: 1956-02-11 Today's Date: 02/07/2024   History of Present Illness 68 y.o. female presents to [facility] hospital on 02/06/2024 for elective R THA. PMH includes L THA, HLD, HTN, OSA, basal cell carcinoma.    PT Comments  Continuing work on functional mobility and activity tolerance;  session focused on progressive amb and functional transfers in prep for dc home; managing hallway walking well;  Discussed stair negotiation and car transfers; OK for dc home from PT standpoint    If plan is discharge home, recommend the following: A little help with bathing/dressing/bathroom;Assistance with cooking/housework;Assist for transportation   Can travel by private vehicle        Equipment Recommendations  BSC/3in1 (If she doesn't already have one)    Recommendations for Other Services       Precautions / Restrictions Precautions Precautions: Fall (fall risk is present, but minimal) Recall of Precautions/Restrictions: Intact Precaution/Restrictions Comments: direct anterior hip replacement Restrictions RLE Weight Bearing Per Provider Order: Weight bearing as tolerated     Mobility  Bed Mobility Overal bed mobility: Needs Assistance Bed Mobility: Supine to Sit     Supine to sit: Supervision     General bed mobility comments: Sat up to long sit, and turned to EOB    Transfers Overall transfer level: Needs assistance Equipment used: Rolling walker (2 wheels) Transfers: Sit to/from Stand Sit to Stand: Supervision           General transfer comment: No difficulty    Ambulation/Gait Ambulation/Gait assistance: Supervision Gait Distance (Feet): 200 Feet Assistive device: Rolling walker (2 wheels) Gait Pattern/deviations: Step-through pattern Gait velocity: reduced     General Gait Details: steady step-through gait; slightly woozy from medications   Stairs         General stair comments: We  discussed stair negotiation   Wheelchair Mobility     Tilt Bed    Modified Rankin (Stroke Patients Only)       Balance     Sitting balance-Leahy Scale: Good       Standing balance-Leahy Scale: Poor (approaching fair)                              Communication Communication Communication: No apparent difficulties  Cognition Arousal: Alert Behavior During Therapy: WFL for tasks assessed/performed   PT - Cognitive impairments: No apparent impairments                         Following commands: Intact      Cueing Cueing Techniques: Verbal cues  Exercises Total Joint Exercises Quad Sets: AROM, Right, 10 reps Gluteal Sets: AROM, Both, 10 reps Towel Squeeze: AROM, Both, 10 reps Heel Slides: AROM, Right, 10 reps Hip ABduction/ADduction: AAROM, Right, 10 reps    General Comments General comments (skin integrity, edema, etc.): Discussed car transfers      Pertinent Vitals/Pain Pain Assessment Pain Assessment: Faces Faces Pain Scale: Hurts little more Pain Location: R hip Pain Descriptors / Indicators: Burning, Sore Pain Intervention(s): Monitored during session, Premedicated before session    Home Living                          Prior Function            PT Goals (current goals can now be found in the care plan section) Acute Rehab PT  Goals Patient Stated Goal: to return to independence PT Goal Formulation: With patient Time For Goal Achievement: 02/10/24 Potential to Achieve Goals: Good Progress towards PT goals: Progressing toward goals    Frequency    7X/week      PT Plan      Co-evaluation              AM-PAC PT 6 Clicks Mobility   Outcome Measure  Help needed turning from your back to your side while in a flat bed without using bedrails?: A Little Help needed moving from lying on your back to sitting on the side of a flat bed without using bedrails?: A Little Help needed moving to and from a bed  to a chair (including a wheelchair)?: A Little Help needed standing up from a chair using your arms (e.g., wheelchair or bedside chair)?: A Little Help needed to walk in hospital room?: A Little Help needed climbing 3-5 steps with a railing? : A Little 6 Click Score: 18    End of Session Equipment Utilized During Treatment: Gait belt Activity Tolerance: Patient tolerated treatment well Patient left: in chair;with call bell/phone within reach Nurse Communication: Mobility status PT Visit Diagnosis: Other abnormalities of gait and mobility (R26.89);Muscle weakness (generalized) (M62.81);Pain Pain - Right/Left: Right Pain - part of body: Hip     Time: 9148-9084 PT Time Calculation (min) (ACUTE ONLY): 24 min  Charges:    $Gait Training: 8-22 mins $Therapeutic Exercise: 8-22 mins PT General Charges $$ ACUTE PT VISIT: 1 Visit                     Silvano Currier, PT  Acute Rehabilitation Services Office [phone] Secure Chat welcomed    Silvano VEAR Currier 02/07/2024, 9:54 AM

## 2024-02-07 NOTE — Progress Notes (Signed)
 Patient alert and oriented, mae's well, voiding adequate amount of urine, swallowing without difficulty, no c/o pain at time of discharge. Patient discharged home with family. Script and discharged instructions given to patient. Patient and family stated understanding of instructions given. Room was checked and accounted for all patient's belongings; discharge instructions concerning her medications, incision care, follow up appointment and when to call the doctor as needed were all discussed with patient by RN and she expressed understanding on the instructions given.

## 2024-02-07 NOTE — Discharge Instructions (Signed)

## 2024-02-07 NOTE — Care Management Obs Status (Signed)
 MEDICARE OBSERVATION STATUS NOTIFICATION   Patient Details  Name: Mykal Kirchman MRN: [id number] Date of Birth: [birth year]   Medicare Observation Status Notification Given:  Yes    Jon Cruel 02/07/2024, 8:29 AM

## 2024-02-07 NOTE — Discharge Summary (Signed)
 Patient ID: Maria Mcpherson MRN: [id number] DOB/AGE: 1956/06/05 68 y.o.  Admit date: 02/06/2024 Discharge date: 02/07/2024  Admission Diagnoses:  Principal Problem:   Unilateral primary osteoarthritis, right hip Active Problems:   Status post total replacement of right hip   Discharge Diagnoses:  Same  Past Medical History:  Diagnosis Date   Arthritis    Bilateral swelling of legs and ankles    Borderline diabetes    Diabetes mellitus without complication (HCC)    Fibrocystic breast disease    Heart murmur    very slight   History of nonmelanoma skin cancer    HTN (hypertension)    Hx of blood clots    Hyperlipidemia    Hypokalemia    Prediabetes    pulmonary emboli 04/2005   same time as oophrectomy/myomectomy, evlauation was negative   Skin cancer    Sleep apnea    CPAP    Surgeries: Procedure(s): ARTHROPLASTY, HIP, TOTAL, ANTERIOR APPROACH on 02/06/2024   Consultants:   Discharged Condition: Improved  Hospital Course: Maria Mcpherson is an 68 y.o. female who was admitted 02/06/2024 for operative treatment ofUnilateral primary osteoarthritis, right hip. Patient has severe unremitting pain that affects sleep, daily activities, and work/hobbies. After pre-op clearance the patient was taken to the operating room on 02/06/2024 and underwent  Procedure(s): ARTHROPLASTY, HIP, TOTAL, ANTERIOR APPROACH.    Patient was given perioperative antibiotics:  Anti-infectives (From admission, onward)    Start     Dose/Rate Route Frequency Ordered Stop   02/06/24 1330  ceFAZolin  (ANCEF ) IVPB 2g/100 mL premix        2 g 200 mL/hr over 30 Minutes Intravenous Every 6 hours 02/06/24 1027 02/06/24 1944   02/06/24 0600  ceFAZolin  (ANCEF ) IVPB 2g/100 mL premix        2 g 200 mL/hr over 30 Minutes Intravenous On call to O.R. 02/06/24 0550 02/06/24 0734        Patient was given sequential compression devices, early ambulation, and chemoprophylaxis to prevent DVT.  Patient benefited  maximally from hospital stay and there were no complications.    Recent vital signs: Patient Vitals for the past 24 hrs:  BP Temp Temp src Pulse Resp SpO2  02/07/24 0756 103/64 98.9 F (37.2 C) Oral 88 16 93 %  02/07/24 0355 123/70 99.5 F (37.5 C) -- 94 18 92 %  02/06/24 2249 125/78 99.4 F (37.4 C) Oral 85 18 96 %  02/06/24 2100 -- -- -- 74 16 100 %  02/06/24 1924 (!) 146/80 98.5 F (36.9 C) Oral 75 18 100 %  02/06/24 1551 124/81 98.8 F (37.1 C) Oral 77 16 99 %     Recent laboratory studies:  Recent Labs    02/07/24 0648  WBC 11.1*  HGB 12.3  HCT 36.7  PLT 237  NA 137  K 4.0  CL 95*  CO2 26  BUN 15  CREATININE 1.45*  GLUCOSE 156*  CALCIUM  8.7*     Discharge Medications:   Allergies as of 02/07/2024       Reactions   Ampicillin Rash        Medication List     TAKE these medications    Accu-Chek Softclix Lancets lancets USE 1 DAILY AS DIRECTED   amLODipine  2.5 MG tablet Commonly known as: NORVASC  Take 1 tablet (2.5 mg total) by mouth daily.   aspirin  81 MG chewable tablet Chew 1 tablet (81 mg total) by mouth 2 (two) times daily.   Blood Glucose Monitoring Suppl [place]  1 each by Does not apply route in the morning, at noon, and at bedtime. May substitute to any manufacturer covered by patient's insurance.   BLOOD GLUCOSE TEST STRIPS Strp 1 each by In Vitro route daily. May substitute to any manufacturer covered by patient's insurance.   CALCIUM  600 + D PO Take 1 tablet by mouth daily.   cetirizine 10 MG tablet Commonly known as: ZYRTEC Take 10 mg by mouth daily.   Fish Oil 1200 MG Caps Take 1,200 mg by mouth daily.   furosemide  40 MG tablet Commonly known as: LASIX  Take 1 tablet (40 mg total) by mouth daily.   losartan  100 MG tablet Commonly known as: COZAAR  Take 1 tablet (100 mg total) by mouth daily.   metFORMIN  500 MG tablet Commonly known as: GLUCOPHAGE  Take 1 tablet (500 mg total) by mouth 2 (two) times daily with a meal.    methocarbamol  500 MG tablet Commonly known as: ROBAXIN  Take 1 tablet (500 mg total) by mouth every 6 (six) hours as needed for muscle spasms.   metoprolol  tartrate 50 MG tablet Commonly known as: LOPRESSOR  Take 1 tablet (50 mg total) by mouth daily.   multivitamin capsule Take 1 capsule by mouth daily.   oxyCODONE  5 MG immediate release tablet Commonly known as: Oxy IR/ROXICODONE  Take 1-2 tablets (5-10 mg total) by mouth every 6 (six) hours as needed for moderate pain (pain score 4-6) (pain score 4-6).   rosuvastatin  10 MG tablet Commonly known as: CRESTOR  Take 1 tablet (10 mg total) by mouth daily.   Semaglutide  (1 MG/DOSE) 4 MG/3ML Sopn Inject 1 mg as directed once a week.               Durable Medical Equipment  (From admission, onward)           Start     Ordered   02/06/24 1028  DME 3 n 1  Once        02/06/24 1027   02/06/24 1028  DME Walker rolling  Once       Question Answer Comment  Walker: With 5 Inch Wheels   Patient needs a walker to treat with the following condition Status post total replacement of right hip      02/06/24 1027            Diagnostic Studies: DG Pelvis Portable Result Date: 02/06/2024 CLINICAL DATA:  Status post right hip arthroplasty. EXAM: PORTABLE PELVIS 1-2 VIEWS COMPARISON:  None Available. FINDINGS: Right hip arthroplasty in expected alignment. No periprosthetic lucency or fracture. Recent postsurgical change includes air and edema in the soft tissues. Overlying skin staples in place. Previous left hip arthroplasty. IMPRESSION: Right hip arthroplasty without immediate postoperative complication. Electronically Signed   By: Andrea Gasman M.D.   On: 02/06/2024 12:15   DG HIP UNILAT WITH PELVIS 1V RIGHT Result Date: 02/06/2024 CLINICAL DATA:  Elective surgery. EXAM: DG HIP (WITH OR WITHOUT PELVIS) 1V RIGHT COMPARISON:  None Available. FINDINGS: Five fluoroscopic spot views of the pelvis and right hip obtained in the  operating room. Images during hip arthroplasty. Fluoroscopy time 25.5 seconds. Dose 3.12 mGy. IMPRESSION: Intraoperative fluoroscopy during right hip arthroplasty. Electronically Signed   By: Andrea Gasman M.D.   On: 02/06/2024 12:15   DG C-Arm 1-60 Min-No Report Result Date: 02/06/2024 Fluoroscopy was utilized by the requesting physician.  No radiographic interpretation.    Disposition: Discharge disposition: 01-Home or Self Care          Follow-up Information  [facility] Follow up.   Specialty: Home Health Services Why: [facility] will contact you for the first home visit Contact information: [address] [place] [address] [phone]         Vernetta Lonni GRADE, MD Follow up in 2 week(s).   Specialty: Orthopedic Surgery Contact information: [address] [place]  [place] [address] [phone]                  Signed: Lonni GRADE Vernetta 02/07/2024, 12:02 PM

## 2024-02-07 NOTE — Progress Notes (Signed)
 Subjective: 1 Day Post-Op Procedure(s) (LRB): ARTHROPLASTY, HIP, TOTAL, ANTERIOR APPROACH (Right) Patient reports pain as moderate.    Objective: Vital signs in last 24 hours: Temp:  [97.7 F (36.5 C)-99.5 F (37.5 C)] 99.5 F (37.5 C) (07/16 0355) Pulse Rate:  [55-94] 94 (07/16 0355) Resp:  [15-20] 18 (07/16 0355) BP: (113-149)/(68-83) 123/70 (07/16 0355) SpO2:  [92 %-100 %] 92 % (07/16 0355) FiO2 (%):  [21 %] 21 % (07/15 2100)  Intake/Output from previous day: 07/15 0701 - 07/16 0700 In: 1720 [P.O.:720; I.V.:1000] Out: 1720 [Urine:1620; Blood:100] Intake/Output this shift: No intake/output data recorded.  No results for input(s): HGB in the last 72 hours. No results for input(s): WBC, RBC, HCT, PLT in the last 72 hours. No results for input(s): NA, K, CL, CO2, BUN, CREATININE, GLUCOSE, CALCIUM  in the last 72 hours. No results for input(s): LABPT, INR in the last 72 hours.  Sensation intact distally Intact pulses distally Dorsiflexion/Plantar flexion intact Incision: dressing C/D/I   Assessment/Plan: 1 Day Post-Op Procedure(s) (LRB): ARTHROPLASTY, HIP, TOTAL, ANTERIOR APPROACH (Right) Up with therapy Discharge home with home health      Maria Mcpherson Poli 02/07/2024, 7:39 AM

## 2024-02-14 ENCOUNTER — Other Ambulatory Visit: Payer: Self-pay | Admitting: Orthopaedic Surgery

## 2024-02-21 ENCOUNTER — Ambulatory Visit (INDEPENDENT_AMBULATORY_CARE_PROVIDER_SITE_OTHER): Admitting: Orthopaedic Surgery

## 2024-02-21 ENCOUNTER — Encounter: Payer: Self-pay | Admitting: Orthopaedic Surgery

## 2024-02-21 DIAGNOSIS — Z96641 Presence of right artificial hip joint: Secondary | ICD-10-CM

## 2024-02-21 NOTE — Progress Notes (Signed)
 The patient is here for first postoperative visit status post a right total hip replacement.  We have replaced her left hip in the past.  She is 68 years old.  She is already walking without any assistive device and is doing great.  She wants to be able to swim because she does have a leg callus but she knows to wait at least 4 weeks postoperative before splinting.  Her leg lengths appear to be equal.  Her right hip looks good.  The incision shows the staples are removed and Steri-Strips applied.  I did aspirate only about 10 cc to 15 cc of seroma.  Her calf is soft.  She has been compliant with a baby aspirin  twice daily and can stop this.  She will slowly increase her activities as comfort allows.  Will see her back in a month to see how she is doing overall but no x-rays are needed.  If there are issues before then she knows to reach out and let us  know.

## 2024-03-20 ENCOUNTER — Encounter: Payer: Self-pay | Admitting: Orthopaedic Surgery

## 2024-03-20 ENCOUNTER — Ambulatory Visit (INDEPENDENT_AMBULATORY_CARE_PROVIDER_SITE_OTHER)

## 2024-03-20 ENCOUNTER — Ambulatory Visit (INDEPENDENT_AMBULATORY_CARE_PROVIDER_SITE_OTHER): Admitting: Orthopaedic Surgery

## 2024-03-20 VITALS — BP 120/80 | HR 70 | Ht 66.0 in | Wt 186.0 lb

## 2024-03-20 DIAGNOSIS — Z96641 Presence of right artificial hip joint: Secondary | ICD-10-CM

## 2024-03-20 DIAGNOSIS — Z0000 Encounter for general adult medical examination without abnormal findings: Secondary | ICD-10-CM

## 2024-03-20 NOTE — Progress Notes (Signed)
 Subjective:   Maria Mcpherson is a 68 y.o. female who presents for Medicare Annual (Subsequent) preventive examination.  Visit Complete: In person  Patient Medicare AWV questionnaire was completed by the patient on 03/20/24; I have confirmed that all information answered by patient is correct and no changes since this date.  Cardiac Risk Factors include: dyslipidemia;hypertension     Objective:    Today's Vitals   03/20/24 1130 03/20/24 1153  BP: 120/80   Pulse: 70   SpO2: 98%   Weight: 186 lb (84.4 kg)   Height: 5' 6 (1.676 m)   PainSc: 0-No pain 0-No pain   Body mass index is 30.02 kg/m.     03/20/2024   11:54 AM 01/24/2024    9:12 AM 01/09/2024    3:06 PM 10/06/2023   10:05 AM 09/13/2022   11:21 AM 08/23/2019    8:47 AM 08/28/2015    3:57 PM  Advanced Directives  Does Patient Have a Medical Advance Directive? Yes Yes Yes Yes Yes Yes Yes   Type of Advance Directive Living will;Healthcare Power of [employer] Power of [employer] Power of [place];Living will  Healthcare Power of [place];Living will Healthcare Power of [place];Living will Healthcare Power of [place];Living will   Does patient want to make changes to medical advance directive?    No - Patient declined   No - Patient declined   Copy of Healthcare Power of Attorney in Chart? Yes - validated most recent copy scanned in chart (See row information) No - copy requested No - copy requested   No - copy requested Yes      Data saved with a previous flowsheet row definition    Current Medications (verified) Outpatient Encounter Medications as of 03/20/2024  Medication Sig   Accu-Chek Softclix Lancets lancets USE 1 DAILY AS DIRECTED   amLODipine  (NORVASC ) 2.5 MG tablet Take 1 tablet (2.5 mg total) by mouth daily.   Blood Glucose Monitoring Suppl DEVI 1 each by Does not apply route in the morning, at noon, and at bedtime. May substitute to any manufacturer covered by patient's insurance.   Calcium   Carb-Cholecalciferol (CALCIUM  600 + D PO) Take 1 tablet by mouth daily.   cetirizine (ZYRTEC) 10 MG tablet Take 10 mg by mouth daily.   [facility] ASPIRIN  ADULT LOW DOSE 81 MG chewable tablet CHEW 1 TABLET (81 MG TOTAL) BY MOUTH 2 (TWO) TIMES DAILY.   furosemide  (LASIX ) 40 MG tablet Take 1 tablet (40 mg total) by mouth daily.   Glucose Blood (BLOOD GLUCOSE TEST STRIPS) STRP 1 each by In Vitro route daily. May substitute to any manufacturer covered by patient's insurance.   losartan  (COZAAR ) 100 MG tablet Take 1 tablet (100 mg total) by mouth daily.   metFORMIN  (GLUCOPHAGE ) 500 MG tablet Take 1 tablet (500 mg total) by mouth 2 (two) times daily with a meal.   methocarbamol  (ROBAXIN ) 500 MG tablet Take 1 tablet (500 mg total) by mouth every 6 (six) hours as needed for muscle spasms.   metoprolol  tartrate (LOPRESSOR ) 50 MG tablet Take 1 tablet (50 mg total) by mouth daily.   Multiple Vitamin (MULTIVITAMIN) capsule Take 1 capsule by mouth daily.   Omega-3 Fatty Acids (FISH OIL) 1200 MG CAPS Take 1,200 mg by mouth daily.   oxyCODONE  (OXY IR/ROXICODONE ) 5 MG immediate release tablet Take 1-2 tablets (5-10 mg total) by mouth every 6 (six) hours as needed for moderate pain (pain score 4-6) (pain score 4-6).   rosuvastatin  (CRESTOR ) 10 MG tablet  Take 1 tablet (10 mg total) by mouth daily.   Semaglutide , 1 MG/DOSE, 4 MG/3ML SOPN Inject 1 mg as directed once a week.   No facility-administered encounter medications on file as of 03/20/2024.    Allergies (verified) Ampicillin   History: Past Medical History:  Diagnosis Date   Arthritis    Bilateral swelling of legs and ankles    Borderline diabetes    Diabetes mellitus without complication (HCC)    Fibrocystic breast disease    Heart murmur    very slight   History of nonmelanoma skin cancer    HTN (hypertension)    Hx of blood clots    Hyperlipidemia    Hypokalemia    Prediabetes    pulmonary emboli 04/2005   same time as oophrectomy/myomectomy,  evlauation was negative   Skin cancer    Sleep apnea    CPAP   Past Surgical History:  Procedure Laterality Date   COLONOSCOPY  11/05/2009   OOPHORECTOMY  04/24/2005   only had one ovary removed unsure of rt or left   TONSILLECTOMY AND ADENOIDECTOMY  07/25/1961   TOTAL HIP ARTHROPLASTY Left 08/28/2015   Procedure: LEFT TOTAL HIP ARTHROPLASTY ANTERIOR APPROACH;  Surgeon: Lonni CINDERELLA Poli, MD;  Location: [facility];  Service: Orthopedics;  Laterality: Left;   TOTAL HIP ARTHROPLASTY Right 02/06/2024   Procedure: ARTHROPLASTY, HIP, TOTAL, ANTERIOR APPROACH;  Surgeon: Poli Lonni CINDERELLA, MD;  Location: [facility];  Service: Orthopedics;  Laterality: Right;   Family History  Problem Relation Age of Onset   Stomach cancer Mother    Breast cancer Mother 68       mid 68s   Hypertension Mother    Hyperlipidemia Mother    Cancer Mother    Leukemia Father    Stroke Maternal Grandfather    Colon cancer Neg Hx    Esophageal cancer Neg Hx    Social History   Socioeconomic History   Marital status: Married    Spouse name: Not on file   Number of children: Not on file   Years of education: Not on file   Highest education level: Professional school degree (e.g., MD, DDS, DVM, JD)  Occupational History   Occupation: attorney  Tobacco Use   Smoking status: Never   Smokeless tobacco: Never  Vaping Use   Vaping status: Never Used  Substance and Sexual Activity   Alcohol  use: Not Currently    Comment: occasional   Drug use: No   Sexual activity: Never  Other Topics Concern   Not on file  Social History Narrative   Not on file   Social Drivers of Health   Financial Resource Strain: Low Risk  (01/04/2024)   Overall Financial Resource Strain (CARDIA)    Difficulty of Paying Living Expenses: Not hard at all  Food Insecurity: No Food Insecurity (03/20/2024)   Hunger Vital Sign    Worried About Running Out of Food in the Last Year: Never true    Ran Out of Food in the Last Year: Never  true  Transportation Needs: No Transportation Needs (01/04/2024)   PRAPARE - Administrator, Civil Service (Medical): No    Lack of Transportation (Non-Medical): No  Physical Activity: Sufficiently Active (01/04/2024)   Exercise Vital Sign    Days of Exercise per Week: 3 days    Minutes of Exercise per Session: 60 min  Stress: No Stress Concern Present (01/04/2024)   [employer] of Occupational Health - Occupational Stress Questionnaire  Feeling of Stress: Not at all  Social Connections: Socially Integrated (01/04/2024)   Social Connection and Isolation Panel    Frequency of Communication with Friends and Family: Once a week    Frequency of Social Gatherings with Friends and Family: More than three times a week    Attends Religious Services: More than 4 times per year    Active Member of [employer] or Organizations: Yes    Attends Engineer, structural: More than 4 times per year    Marital Status: Married    Tobacco Counseling Counseling given: Not Answered   Clinical Intake:  Pre-visit preparation completed: Yes  Pain : No/denies pain Pain Score: 0-No pain     BMI - recorded: 30.02 Nutritional Status: BMI > 30  Obese Nutritional Risks: None  How often do you need to have someone help you when you read instructions, pamphlets, or other written materials from your doctor or pharmacy?: 1 - Never  Interpreter Needed?: No  Information entered by :: Kathlynn Porto, CMA   Activities of Daily Living    03/20/2024   11:28 AM 01/24/2024    9:14 AM  In your present state of health, do you have any difficulty performing the following activities:  Hearing? 0   Vision? 0   Difficulty concentrating or making decisions? 0   Walking or climbing stairs? 0   Dressing or bathing? 0   Doing errands, shopping? 0 0  Preparing Food and eating ? N   Using the Toilet? N   In the past six months, have you accidently leaked urine? N   Do you have problems with loss  of bowel control? N   Managing your Medications? N   Managing your Finances? N   Housekeeping or managing your Housekeeping? N     Patient Care Team: Perri Ronal PARAS, MD as PCP - General (Internal Medicine) Lloyd Savannah, MD as Referring Physician (Dermatology) Izell Domino, MD as Attending Physician (Radiation Oncology)  Indicate any recent Medical Services you may have received from other than [facility] providers in the past year (date may be approximate).     Assessment:   This is a routine wellness examination for [place].  Hearing/Vision screen Hearing Screening   500Hz  2000Hz  4000Hz  5000Hz   Right ear Pass Pass Pass Pass  Left ear Pass Pass Pass Pass    Depression Screen    03/20/2024   11:54 AM 01/09/2024    3:10 PM 10/06/2023   10:05 AM 10/31/2022   12:00 PM 09/13/2022   11:21 AM 07/29/2022   12:03 PM 06/02/2022    9:32 AM  PHQ 2/9 Scores  PHQ - 2 Score 0 0 0 0 0 0 0    Fall Risk    03/20/2024   11:53 AM 01/09/2024    2:58 PM 01/04/2024    7:14 AM 10/06/2023   10:04 AM 10/31/2022   11:58 AM  Fall Risk   Falls in the past year? 1 0 0 0 0  Number falls in past yr: 0 0 0  0  Injury with Fall? 0 0 0  0  Risk for fall due to : No Fall Risks No Fall Risks   No Fall Risks  Follow up Falls prevention discussed;Education provided;Falls evaluation completed Falls prevention discussed;Education provided;Falls evaluation completed   Falls prevention discussed    MEDICARE RISK AT HOME: Medicare Risk at Home Any stairs in or around the home?: Yes If so, are there any without handrails?: No Home free of  loose throw rugs in walkways, pet beds, electrical cords, etc?: Yes Adequate lighting in your home to reduce risk of falls?: Yes Life alert?: No Use of a cane, walker or w/c?: No Grab bars in the bathroom?: No Shower chair or bench in shower?: No Elevated toilet seat or a handicapped toilet?: No  TIMED UP AND GO:  Was the test performed?  No    Cognitive Function:         03/20/2024   11:53 AM 01/09/2024    2:59 PM 09/13/2022   11:22 AM  6CIT Screen  What Year? 0 points 0 points 0 points  What month? 0 points 0 points 0 points  What time? 0 points 0 points 0 points  Count back from 20 0 points 0 points 0 points  Months in reverse 0 points 0 points 0 points  Repeat phrase 0 points 0 points 0 points  Total Score 0 points 0 points 0 points    Immunizations Immunization History  Administered Date(s) Administered   Fluad Quad(high Dose 65+) 03/30/2022   Influenza Inj Mdck Quad With Preservative 06/30/2018   Influenza Split 04/18/2012, 05/02/2013, 04/29/2015, 04/24/2017   Influenza,inj,Quad PF,6+ Mos 06/25/2018, 05/09/2019   Influenza-Unspecified 04/24/2014, 05/04/2016, 05/13/2020, 05/10/2021   Moderna Sars-Covid-2 Vaccination 10/16/2019, 11/13/2019   PFIZER(Purple Top)SARS-COV-2 Vaccination 06/04/2020, 04/16/2021   PNEUMOCOCCAL CONJUGATE-20 06/02/2022   Pfizer Covid-19 Vaccine Bivalent Booster 41yrs & up 04/16/2021   Td 11/08/2023   Tdap 09/08/2010, 08/04/2020, 11/08/2023   Zoster Recombinant(Shingrix) 08/01/2023, 10/31/2023    TDAP status: Up to date  Flu Vaccine status: Due, Education has been provided regarding the importance of this vaccine. Advised may receive this vaccine at local pharmacy or Health Dept. Aware to provide a copy of the vaccination record if obtained from local pharmacy or Health Dept. Verbalized acceptance and understanding.  Pneumococcal vaccine status: Up to date  Covid-19 vaccine status: Information provided on how to obtain vaccines.   Qualifies for Shingles Vaccine? Yes   Zostavax completed No   Shingrix Completed?: Yes  Screening Tests Health Maintenance  Topic Date Due   COVID-19 Vaccine (5 - 2024-25 season) 03/26/2023   OPHTHALMOLOGY EXAM  08/17/2023   INFLUENZA VACCINE  02/23/2024   Colonoscopy  07/02/2024   HEMOGLOBIN A1C  07/09/2024   FOOT EXAM  08/14/2024   Diabetic kidney evaluation - Urine ACR   01/07/2025   Diabetic kidney evaluation - eGFR measurement  02/06/2025   Medicare Annual Wellness (AWV)  03/20/2025   MAMMOGRAM  10/29/2025   DTaP/Tdap/Td (5 - Td or Tdap) 11/07/2033   Pneumococcal Vaccine: 50+ Years  Completed   DEXA SCAN  Completed   HPV VACCINES  Aged Out   Meningococcal B Vaccine  Aged Out   Hepatitis C Screening  Discontinued   Zoster Vaccines- Shingrix  Discontinued    Health Maintenance  Health Maintenance Due  Topic Date Due   COVID-19 Vaccine (5 - 2024-25 season) 03/26/2023   OPHTHALMOLOGY EXAM  08/17/2023   INFLUENZA VACCINE  02/23/2024   Colonoscopy  07/02/2024    Colorectal cancer screening: Type of screening: Colonoscopy. Completed 07/02/2021. Repeat every 3 years  Mammogram status: Completed 10/30/2023. Repeat every year  Bone Density status: Completed 03/31/2016. Results reflect: Bone density results: NORMAL. Repeat every 5 years.  Lung Cancer Screening: (Low Dose CT Chest recommended if Age 46-80 years, 20 pack-year currently smoking OR have quit w/in 15years.) does not qualify.   Additional Screening:  Hepatitis C Screening: does not qualify; Completed   Vision  Screening: Recommended annual ophthalmology exams for early detection of glaucoma and other disorders of the eye. Is the patient up to date with their annual eye exam?  Yes  Who is the provider or what is the name of the office in which the patient attends annual eye exams? [facility] If pt is not established with a provider, would they like to be referred to a provider to establish care? No .   Dental Screening: Recommended annual dental exams for proper oral hygiene  Diabetic Foot Exam: Diabetic Foot Exam: Completed 01/08/24  Community Resource Referral / Chronic Care Management: CRR required this visit?  No   CCM required this visit?  No         Plan:     I have personally reviewed and noted the following in the patient's chart:   Medical and social history Use of  alcohol , tobacco or illicit drugs  Current medications and supplements including opioid prescriptions. Patient is not currently taking opioid prescriptions. Functional ability and status Nutritional status Physical activity Advanced directives List of other physicians Hospitalizations, surgeries, and ER visits in previous 12 months Vitals Screenings to include cognitive, depression, and falls Referrals and appointments  In addition, I have reviewed and discussed with patient certain preventive protocols, quality metrics, and best practice recommendations. A written personalized care plan for preventive services as well as general preventive health recommendations were provided to patient.     Drexel Ivey Zelda, CMA   03/20/2024   After Visit Summary: (In Person-Printed) AVS printed and given to the patient

## 2024-03-20 NOTE — Patient Instructions (Signed)
 Next appointment: Follow up in one year for your annual wellness visit   Preventive Care 65 Years and Older, Female Preventive care refers to lifestyle choices and visits with your health care provider that can promote health and wellness. What does preventive care include? A yearly physical exam. This is also called an annual well check. Dental exams once or twice a year. Routine eye exams. Ask your health care provider how often you should have your eyes checked. Personal lifestyle choices, including: Daily care of your teeth and gums. Regular physical activity. Eating a healthy diet. Avoiding tobacco and drug use. Limiting alcohol  use. Practicing safe sex. Taking low-dose aspirin  every day. Taking vitamin and mineral supplements as recommended by your health care provider. What happens during an annual well check? The services and screenings done by your health care provider during your annual well check will depend on your age, overall health, lifestyle risk factors, and family history of disease. Counseling  Your health care provider may ask you questions about your: Alcohol  use. Tobacco use. Drug use. Emotional well-being. Home and relationship well-being. Sexual activity. Eating habits. History of falls. Memory and ability to understand (cognition). Work and work Astronomer. Reproductive health. Screening  You may have the following tests or measurements: Height, weight, and BMI. Blood pressure. Lipid and cholesterol levels. These may be checked every 5 years, or more frequently if you are over 34 years old. Skin check. Lung cancer screening. You may have this screening every year starting at age 29 if you have a 30-pack-year history of smoking and currently smoke or have quit within the past 15 years. Fecal occult blood test (FOBT) of the stool. You may have this test every year starting at age 21. Flexible sigmoidoscopy or colonoscopy. You may have a  sigmoidoscopy every 5 years or a colonoscopy every 10 years starting at age 23. Hepatitis C blood test. Hepatitis B blood test. Sexually transmitted disease (STD) testing. Diabetes screening. This is done by checking your blood sugar (glucose) after you have not eaten for a while (fasting). You may have this done every 1-3 years. Bone density scan. This is done to screen for osteoporosis. You may have this done starting at age 25. Mammogram. This may be done every 1-2 years. Talk to your health care provider about how often you should have regular mammograms. Talk with your health care provider about your test results, treatment options, and if necessary, the need for more tests. Vaccines  Your health care provider may recommend certain vaccines, such as: Influenza vaccine. This is recommended every year. Tetanus, diphtheria, and acellular pertussis (Tdap, Td) vaccine. You may need a Td booster every 10 years. Zoster vaccine. You may need this after age 10. Pneumococcal 13-valent conjugate (PCV13) vaccine. One dose is recommended after age 38. Pneumococcal polysaccharide (PPSV23) vaccine. One dose is recommended after age 72. Talk to your health care provider about which screenings and vaccines you need and how often you need them. This information is not intended to replace advice given to you by your health care provider. Make sure you discuss any questions you have with your health care provider. Document Released: 08/07/2015 Document Revised: 03/30/2016 Document Reviewed: 05/12/2015 Elsevier Interactive Patient Education  2017 [employer].  Fall Prevention in the Home Falls can cause injuries. They can happen to people of all ages. There are many things you can do to make your home safe and to help prevent falls. What can I do on the outside of my  home? Regularly fix the edges of walkways and driveways and fix any cracks. Remove anything that might make you trip as you walk through a  door, such as a raised step or threshold. Trim any bushes or trees on the path to your home. Use bright outdoor lighting. Clear any walking paths of anything that might make someone trip, such as rocks or tools. Regularly check to see if handrails are loose or broken. Make sure that both sides of any steps have handrails. Any raised decks and porches should have guardrails on the edges. Have any leaves, snow, or ice cleared regularly. Use sand or salt on walking paths during winter. Clean up any spills in your garage right away. This includes oil or grease spills. What can I do in the bathroom? Use night lights. Install grab bars by the toilet and in the tub and shower. Do not use towel bars as grab bars. Use non-skid mats or decals in the tub or shower. If you need to sit down in the shower, use a plastic, non-slip stool. Keep the floor dry. Clean up any water  that spills on the floor as soon as it happens. Remove soap buildup in the tub or shower regularly. Attach bath mats securely with double-sided non-slip rug tape. Do not have throw rugs and other things on the floor that can make you trip. What can I do in the bedroom? Use night lights. Make sure that you have a light by your bed that is easy to reach. Do not use any sheets or blankets that are too big for your bed. They should not hang down onto the floor. Have a firm chair that has side arms. You can use this for support while you get dressed. Do not have throw rugs and other things on the floor that can make you trip. What can I do in the kitchen? Clean up any spills right away. Avoid walking on wet floors. Keep items that you use a lot in easy-to-reach places. If you need to reach something above you, use a strong step stool that has a grab bar. Keep electrical cords out of the way. Do not use floor polish or wax that makes floors slippery. If you must use wax, use non-skid floor wax. Do not have throw rugs and other things  on the floor that can make you trip. What can I do with my stairs? Do not leave any items on the stairs. Make sure that there are handrails on both sides of the stairs and use them. Fix handrails that are broken or loose. Make sure that handrails are as long as the stairways. Check any carpeting to make sure that it is firmly attached to the stairs. Fix any carpet that is loose or worn. Avoid having throw rugs at the top or bottom of the stairs. If you do have throw rugs, attach them to the floor with carpet tape. Make sure that you have a light switch at the top of the stairs and the bottom of the stairs. If you do not have them, ask someone to add them for you. What else can I do to help prevent falls? Wear shoes that: Do not have high heels. Have rubber bottoms. Are comfortable and fit you well. Are closed at the toe. Do not wear sandals. If you use a stepladder: Make sure that it is fully opened. Do not climb a closed stepladder. Make sure that both sides of the stepladder are locked into place. Ask someone  to hold it for you, if possible. Clearly mark and make sure that you can see: Any grab bars or handrails. First and last steps. Where the edge of each step is. Use tools that help you move around (mobility aids) if they are needed. These include: Canes. Walkers. Scooters. Crutches. Turn on the lights when you go into a dark area. Replace any light bulbs as soon as they burn out. Set up your furniture so you have a clear path. Avoid moving your furniture around. If any of your floors are uneven, fix them. If there are any pets around you, be aware of where they are. Review your medicines with your doctor. Some medicines can make you feel dizzy. This can increase your chance of falling. Ask your doctor what other things that you can do to help prevent falls. This information is not intended to replace advice given to you by your health care provider. Make sure you discuss any  questions you have with your health care provider. Document Released: 05/07/2009 Document Revised: 12/17/2015 Document Reviewed: 08/15/2014 Elsevier Interactive Patient Education  2017 [employer]..3

## 2024-03-20 NOTE — Progress Notes (Signed)
 The patient is a 68 year old who is now 6 weeks status post a right total hip arthroplasty.  We had replaced her left hip through an anterior approach remotely as well.  She is doing great.  She is very active and not walking with any assistive device at all.  She reports just a little bit of numbness.  She says she is doing well overall.  On exam both hips move smoothly and fluidly with no blocks or rotation and her leg lengths are equal.  This point we do not need to see her back for 6 months unless there are issues.  At that visit we will have a standing AP pelvis.  If there are any issues before then she needs to reach out and let us  know.

## 2024-03-23 ENCOUNTER — Other Ambulatory Visit: Payer: Self-pay | Admitting: Endocrinology

## 2024-03-23 DIAGNOSIS — E119 Type 2 diabetes mellitus without complications: Secondary | ICD-10-CM

## 2024-04-02 ENCOUNTER — Encounter: Payer: Self-pay | Admitting: Adult Health

## 2024-04-02 ENCOUNTER — Ambulatory Visit (INDEPENDENT_AMBULATORY_CARE_PROVIDER_SITE_OTHER): Admitting: Adult Health

## 2024-04-02 VITALS — BP 120/90 | HR 85 | Ht 66.0 in | Wt 180.2 lb

## 2024-04-02 DIAGNOSIS — Z6829 Body mass index (BMI) 29.0-29.9, adult: Secondary | ICD-10-CM | POA: Diagnosis not present

## 2024-04-02 DIAGNOSIS — G4733 Obstructive sleep apnea (adult) (pediatric): Secondary | ICD-10-CM

## 2024-04-02 NOTE — Patient Instructions (Signed)
 Continue on CPAP at bedtime Begin Saline nasal spray Twice daily   Begin Saline nasal gel At bedtime   Keep up the good work Work on [employer] Do not drive sleeping Follow-up in 1 year with Dr. Neda and As needed

## 2024-04-02 NOTE — Progress Notes (Signed)
 @Patient  ID: Maria Mcpherson, female    DOB: [birth year], 68 y.o.   MRN: [id number]  Chief Complaint  Patient presents with   Sleep Apnea    Referring provider: Perri Ronal PARAS, MD  HPI: 68 year old female followed for obstructive sleep apnea   TEST/EVENTS :  Home sleep study March 2017> AHI of 22   04/02/2024 Follow up ; OSA  Discussed the use of AI scribe software for clinical note transcription with the patient, who gave verbal consent to proceed.  History of Present Illness Maria Mcpherson is a 68 year old female with sleep apnea who presents with issues related to her new CPAP machine.  She has been experiencing significant dryness with her new CPAP machine, which she received a few months ago. Despite being the same model and brand as her old machine, the new device causes more dryness. She attempted to use a new full face mask but found it uncomfortable and ineffective in reducing dryness. Consequently, she continues to use her old machine nightly, as it does not cause the same level of dryness.  She has not started any new medications that could contribute to dryness and uses distilled water  in the humidifier. She reports frequent thirst but believes she consumes adequate fluids. She has been using the new machine intermittently since May and June but reverted to her old machine due to discomfort. She has not contacted the home care company regarding the issues with the new machine, assuming it was a problem with her mask.  She has been on Ozempic  since January or February for diabetes management and has experienced a slow weight loss of about 18 pounds.     Allergies  Allergen Reactions   Ampicillin Rash    Immunization History  Administered Date(s) Administered   Fluad Quad(high Dose 65+) 03/30/2022   Influenza Inj Mdck Quad With Preservative 06/30/2018   Influenza Split 04/18/2012, 05/02/2013, 04/29/2015, 04/24/2017   Influenza,inj,Quad PF,6+ Mos 06/25/2018,  05/09/2019   Influenza-Unspecified 04/24/2014, 05/04/2016, 05/13/2020, 05/10/2021   Moderna Sars-Covid-2 Vaccination 10/16/2019, 11/13/2019   PFIZER(Purple Top)SARS-COV-2 Vaccination 06/04/2020, 04/16/2021   PNEUMOCOCCAL CONJUGATE-20 06/02/2022   Pfizer Covid-19 Vaccine Bivalent Booster 15yrs & up 04/16/2021   Td 11/08/2023   Tdap 09/08/2010, 08/04/2020, 11/08/2023   Zoster Recombinant(Shingrix) 08/01/2023, 10/31/2023    Past Medical History:  Diagnosis Date   Arthritis    Bilateral swelling of legs and ankles    Borderline diabetes    Diabetes mellitus without complication (HCC)    Fibrocystic breast disease    Heart murmur    very slight   History of nonmelanoma skin cancer    HTN (hypertension)    Hx of blood clots    Hyperlipidemia    Hypokalemia    Prediabetes    pulmonary emboli 04/2005   same time as oophrectomy/myomectomy, evlauation was negative   Skin cancer    Sleep apnea    CPAP    Tobacco History: Social History   Tobacco Use  Smoking Status Never  Smokeless Tobacco Never   Counseling given: Not Answered   Outpatient Medications Prior to Visit  Medication Sig Dispense Refill   Accu-Chek Softclix Lancets lancets USE 1 DAILY AS DIRECTED 100 each 3   amLODipine  (NORVASC ) 2.5 MG tablet Take 1 tablet (2.5 mg total) by mouth daily. 90 tablet 1   Blood Glucose Monitoring Suppl DEVI 1 each by Does not apply route in the morning, at noon, and at bedtime. May substitute to any manufacturer covered by patient's  insurance. 1 each 0   Calcium  Carb-Cholecalciferol (CALCIUM  600 + D PO) Take 1 tablet by mouth daily.     cetirizine (ZYRTEC) 10 MG tablet Take 10 mg by mouth daily.     [employer] ASPIRIN  ADULT LOW DOSE 81 MG chewable tablet CHEW 1 TABLET (81 MG TOTAL) BY MOUTH 2 (TWO) TIMES DAILY. 180 tablet 1   furosemide  (LASIX ) 40 MG tablet Take 1 tablet (40 mg total) by mouth daily. 90 tablet 1   Glucose Blood (BLOOD GLUCOSE TEST STRIPS) STRP 1 each by In Vitro route daily.  May substitute to any manufacturer covered by patient's insurance. 100 each 3   losartan  (COZAAR ) 100 MG tablet Take 1 tablet (100 mg total) by mouth daily. 90 tablet 1   metFORMIN  (GLUCOPHAGE ) 500 MG tablet Take 1 tablet (500 mg total) by mouth 2 (two) times daily with a meal. 180 tablet 1   metoprolol  tartrate (LOPRESSOR ) 50 MG tablet Take 1 tablet (50 mg total) by mouth daily. 90 tablet 1   Multiple Vitamin (MULTIVITAMIN) capsule Take 1 capsule by mouth daily.     Omega-3 Fatty Acids (FISH OIL) 1200 MG CAPS Take 1,200 mg by mouth daily.     rosuvastatin  (CRESTOR ) 10 MG tablet Take 1 tablet (10 mg total) by mouth daily. 90 tablet 1   Semaglutide , 1 MG/DOSE, 4 MG/3ML SOPN Inject 1 mg as directed once a week. 3 mL 11   methocarbamol  (ROBAXIN ) 500 MG tablet Take 1 tablet (500 mg total) by mouth every 6 (six) hours as needed for muscle spasms. 30 tablet 1   oxyCODONE  (OXY IR/ROXICODONE ) 5 MG immediate release tablet Take 1-2 tablets (5-10 mg total) by mouth every 6 (six) hours as needed for moderate pain (pain score 4-6) (pain score 4-6). 30 tablet 0   No facility-administered medications prior to visit.     Review of Systems:   Constitutional:   No  weight loss, night sweats,  Fevers, chills, fatigue, or  lassitude.  HEENT:   No headaches,  Difficulty swallowing,  Tooth/dental problems, or  Sore throat,                No sneezing, itching, ear ache, +nasal congestion, post nasal drip,   CV:  No chest pain,  Orthopnea, PND, swelling in lower extremities, anasarca, dizziness, palpitations, syncope.   GI  No heartburn, indigestion, abdominal pain, nausea, vomiting, diarrhea, change in bowel habits, loss of appetite, bloody stools.   Resp: No shortness of breath with exertion or at rest.  No excess mucus, no productive cough,  No non-productive cough,  No coughing up of blood.  No change in color of mucus.  No wheezing.  No chest wall deformity  Skin: no rash or lesions.  GU: no dysuria,  change in color of urine, no urgency or frequency.  No flank pain, no hematuria   MS:  No joint pain or swelling.  No decreased range of motion.  No back pain.    Physical Exam  BP (!) 120/90 (BP Location: Left Arm, Patient Position: Sitting, Cuff Size: Normal)   Pulse 85   Ht 5' 6 (1.676 m)   Wt 180 lb 3.2 oz (81.7 kg)   LMP 12/04/2013   SpO2 98% Comment: RA  BMI 29.09 kg/m   GEN: A/Ox3; pleasant , NAD, well nourished    HEENT:  [place]/AT,  EACs-clear, TMs-wnl, NOSE-clear, THROAT-clear, no lesions, no postnasal drip or exudate noted.   NECK:  Supple w/ fair ROM; no JVD;  normal carotid impulses w/o bruits; no thyromegaly or nodules palpated; no lymphadenopathy.    RESP  Clear  P & A; w/o, wheezes/ rales/ or rhonchi. no accessory muscle use, no dullness to percussion  CARD:  RRR, no m/r/g, no peripheral edema, pulses intact, no cyanosis or clubbing.  GI:   Soft & nt; nml bowel sounds; no organomegaly or masses detected.   Musco: Warm bil, no deformities or joint swelling noted.   Neuro: alert, no focal deficits noted.    Skin: Warm, no lesions or rashes    Lab Results:  CBC   BMET   BNP No results found for: BNP  ProBNP No results found for: PROBNP  Imaging: No results found.  Administration History     None           No data to display          No results found for: NITRICOXIDE      Assessment & Plan:   No problem-specific Assessment & Plan notes found for this encounter.   Assessment and Plan Assessment & Plan Obstructive sleep apnea with CPAP-associated dry mouth   obstructive sleep apnea presents with significant dry mouth linked to a new CPAP machine, despite identical settings to the previous one.Increase CPAP humidity setting to 5. Use saline nasal spray twice daily and saline nasal gel at night. Consider a chin strap for mouth opening is suspected. Consider mask fitting for a more comfortable full face mask. Contact the home  care company if issues persist with the new machine. Ensure compliance documentation with an SD card for the old machine. Discuss with the primary care provider about weight management and potential benefits for sleep apnea.  Nasal dryness-saline nasal spray and gel As needed    Obesity -continue with healthy weight loss   Plan  . Patient Instructions  Continue on CPAP at bedtime Begin Saline nasal spray Twice daily   Begin Saline nasal gel At bedtime   Keep up the good work Work on [employer] Do not drive sleeping Follow-up in 1 year with Dr. Neda and As needed        Madelin Stank, NP 04/02/2024

## 2024-05-27 ENCOUNTER — Encounter: Payer: Self-pay | Admitting: Radiology

## 2024-06-11 ENCOUNTER — Encounter: Payer: Self-pay | Admitting: Endocrinology

## 2024-06-11 ENCOUNTER — Ambulatory Visit: Payer: Self-pay | Admitting: Endocrinology

## 2024-06-11 ENCOUNTER — Other Ambulatory Visit

## 2024-06-11 ENCOUNTER — Ambulatory Visit: Admitting: Endocrinology

## 2024-06-11 VITALS — BP 120/84 | HR 88 | Ht 66.0 in | Wt 181.0 lb

## 2024-06-11 DIAGNOSIS — Z7984 Long term (current) use of oral hypoglycemic drugs: Secondary | ICD-10-CM

## 2024-06-11 DIAGNOSIS — Z7985 Long-term (current) use of injectable non-insulin antidiabetic drugs: Secondary | ICD-10-CM

## 2024-06-11 DIAGNOSIS — E785 Hyperlipidemia, unspecified: Secondary | ICD-10-CM | POA: Diagnosis not present

## 2024-06-11 DIAGNOSIS — E119 Type 2 diabetes mellitus without complications: Secondary | ICD-10-CM | POA: Diagnosis not present

## 2024-06-11 LAB — BASIC METABOLIC PANEL WITH GFR
BUN: 24 mg/dL (ref 7–25)
CO2: 31 mmol/L (ref 20–32)
Calcium: 9.7 mg/dL (ref 8.6–10.4)
Chloride: 103 mmol/L (ref 98–110)
Creat: 0.81 mg/dL (ref 0.50–1.05)
Glucose, Bld: 98 mg/dL (ref 65–99)
Potassium: 4.1 mmol/L (ref 3.5–5.3)
Sodium: 142 mmol/L (ref 135–146)
eGFR: 80 mL/min/1.73m2 (ref >=60)

## 2024-06-11 LAB — POCT GLYCOSYLATED HEMOGLOBIN (HGB A1C): Hemoglobin A1C: 5.8 % — AB (ref 4.0–5.6)

## 2024-06-11 MED ORDER — METFORMIN HCL 500 MG PO TABS: 500.0000 mg | ORAL_TABLET | Freq: Two times a day (BID) | ORAL | 3 refills | Status: DC

## 2024-06-11 MED ORDER — SEMAGLUTIDE (1 MG/DOSE) 4 MG/3ML ~~LOC~~ SOPN: 1.0000 mg | PEN_INJECTOR | SUBCUTANEOUS | 3 refills | Status: AC

## 2024-06-11 NOTE — Progress Notes (Signed)
 Outpatient Endocrinology Note Maria Danner, MD   Patient's Name: Maria Mcpherson    DOB: [birth year]    MRN: [id number]                                                    REASON OF VISIT: Follow-up for type 2 diabetes mellitus  REFERRING PROVIDER: Perri Ronal PARAS, MD  PCP: Perri Ronal PARAS, MD  HISTORY OF PRESENT ILLNESS:   Maria Mcpherson is a 68 y.o. old female with past medical history listed below, is here for follow-up for type 2 diabetes mellitus.   Pertinent Diabetes History: Patient had prediabetes diagnosed in 2012, she was started on metformin  in 2016, she was being managed as type 2 diabetes mellitus with with metformin  and glipizide .  She had monitoring for chronic complications as listed below of diabetes in the past.  Januvia  was planned in October 2024 however was not cost effective.  Hemoglobin A1c mostly in the range of 5.6 to 6.2% range.  She has remained controlled on 2 antidiabetic medications.  Although her hemoglobin A1c in the range of 6% range , she has been able to maintain this with 2 antidiabetic medications, we will treat her as controlled type 2 diabetes mellitus.  Initial consult in January 2025.  History of DKA or diabetes related hospitalizations: none  Previous diabetes education: no  Family h/o diabetes mellitus: none   No personal history of pancreatitis and / or family history of medullary thyroid  carcinoma or MEN 2B syndrome.   Chronic Diabetes Complications : Retinopathy: no. Last ophthalmology exam was done on annually, following with ophthalmology regularly.  Nephropathy: no, on ACE/ARB / losartan  Peripheral neuropathy: no Coronary artery disease: no Stroke: no  Relevant comorbidities and cardiovascular risk factors: Obesity: yes Body mass index is 29.21 kg/m.  Hypertension: Yes  Hyperlipidemia : Yes, on statin   Current / Home Diabetic regimen includes:  Metformin  500 mg two times a day, in the morning and at bedtime. Ozempic  1 mg  weekly  Prior diabetic medications: Glipizide  is stopped after restarting Ozempic .  Ozempic  was started in January 2025.  Glycemic data:    She has been checking blood sugar occasionally in the morning fasting, report blood sugar in the range of 97-102 in the morning fasting.    Hypoglycemia: Patient denies hypoglycemic episodes. Patient has hypoglycemia awareness.  Factors modifying glucose control: 1.  Diabetic diet assessment: 3 meals a day, occasionally high sugar food.  2.  Staying active or exercising: Plan to restart exercise regularly.  3.  Medication compliance: compliant all of the time.  Interval history  Hemoglobin A1c 5.8% today.  She has been taking Ozempic  1 mg weekly, denies any GI issues.  She lost about 15 to 20 pounds in last 1 year.  Denies numbness and ting of the feet.  No vision problem.  No other complaints today.  REVIEW OF SYSTEMS As per history of present illness.   PAST MEDICAL HISTORY: Past Medical History:  Diagnosis Date   Arthritis    Bilateral swelling of legs and ankles    Borderline diabetes    Diabetes mellitus without complication (HCC)    Fibrocystic breast disease    Heart murmur    very slight   History of nonmelanoma skin cancer    HTN (hypertension)    Hx of blood clots  Hyperlipidemia    Hypokalemia    Prediabetes    pulmonary emboli 04/2005   same time as oophrectomy/myomectomy, evlauation was negative   Skin cancer    Sleep apnea    CPAP    PAST SURGICAL HISTORY: Past Surgical History:  Procedure Laterality Date   COLONOSCOPY  11/05/2009   OOPHORECTOMY  04/24/2005   only had one ovary removed unsure of rt or left   TONSILLECTOMY AND ADENOIDECTOMY  07/25/1961   TOTAL HIP ARTHROPLASTY Left 08/28/2015   Procedure: LEFT TOTAL HIP ARTHROPLASTY ANTERIOR APPROACH;  Surgeon: Lonni CINDERELLA Poli, MD;  Location: [facility];  Service: Orthopedics;  Laterality: Left;   TOTAL HIP ARTHROPLASTY Right 02/06/2024   Procedure:  ARTHROPLASTY, HIP, TOTAL, ANTERIOR APPROACH;  Surgeon: Poli Lonni CINDERELLA, MD;  Location: [facility];  Service: Orthopedics;  Laterality: Right;    ALLERGIES: Allergies  Allergen Reactions   Ampicillin Rash    FAMILY HISTORY:  Family History  Problem Relation Age of Onset   Stomach cancer Mother    Breast cancer Mother 40       mid 78s   Hypertension Mother    Hyperlipidemia Mother    Cancer Mother    Leukemia Father    Stroke Maternal Grandfather    Colon cancer Neg Hx    Esophageal cancer Neg Hx     SOCIAL HISTORY: Social History   Socioeconomic History   Marital status: Married    Spouse name: Not on file   Number of children: Not on file   Years of education: Not on file   Highest education level: Professional school degree (e.g., MD, DDS, DVM, JD)  Occupational History   Occupation: attorney  Tobacco Use   Smoking status: Never   Smokeless tobacco: Never  Vaping Use   Vaping status: Never Used  Substance and Sexual Activity   Alcohol  use: Not Currently    Comment: occasional   Drug use: No   Sexual activity: Never  Other Topics Concern   Not on file  Social History Narrative   Not on file   Social Drivers of Health   Financial Resource Strain: Low Risk  (01/04/2024)   Overall Financial Resource Strain (CARDIA)    Difficulty of Paying Living Expenses: Not hard at all  Food Insecurity: No Food Insecurity (03/20/2024)   Hunger Vital Sign    Worried About Running Out of Food in the Last Year: Never true    Ran Out of Food in the Last Year: Never true  Transportation Needs: No Transportation Needs (01/04/2024)   PRAPARE - Administrator, Civil Service (Medical): No    Lack of Transportation (Non-Medical): No  Physical Activity: Sufficiently Active (01/04/2024)   Exercise Vital Sign    Days of Exercise per Week: 3 days    Minutes of Exercise per Session: 60 min  Stress: No Stress Concern Present (01/04/2024)   [employer] of Occupational  Health - Occupational Stress Questionnaire    Feeling of Stress: Not at all  Social Connections: Socially Integrated (01/04/2024)   Social Connection and Isolation Panel    Frequency of Communication with Friends and Family: Once a week    Frequency of Social Gatherings with Friends and Family: More than three times a week    Attends Religious Services: More than 4 times per year    Active Member of [employer] or Organizations: Yes    Attends Banker Meetings: More than 4 times per year    Marital Status:  Married    MEDICATIONS:  Current Outpatient Medications  Medication Sig Dispense Refill   Accu-Chek Softclix Lancets lancets USE 1 DAILY AS DIRECTED 100 each 3   amLODipine  (NORVASC ) 2.5 MG tablet Take 1 tablet (2.5 mg total) by mouth daily. 90 tablet 1   Blood Glucose Monitoring Suppl DEVI 1 each by Does not apply route in the morning, at noon, and at bedtime. May substitute to any manufacturer covered by patient's insurance. 1 each 0   Calcium  Carb-Cholecalciferol (CALCIUM  600 + D PO) Take 1 tablet by mouth daily.     cetirizine (ZYRTEC) 10 MG tablet Take 10 mg by mouth daily.     furosemide  (LASIX ) 40 MG tablet Take 1 tablet (40 mg total) by mouth daily. 90 tablet 1   Glucose Blood (BLOOD GLUCOSE TEST STRIPS) STRP 1 each by In Vitro route daily. May substitute to any manufacturer covered by patient's insurance. 100 each 3   losartan  (COZAAR ) 100 MG tablet Take 1 tablet (100 mg total) by mouth daily. 90 tablet 1   metoprolol  tartrate (LOPRESSOR ) 50 MG tablet Take 1 tablet (50 mg total) by mouth daily. 90 tablet 1   Multiple Vitamin (MULTIVITAMIN) capsule Take 1 capsule by mouth daily.     Omega-3 Fatty Acids (FISH OIL) 1200 MG CAPS Take 1,200 mg by mouth daily.     rosuvastatin  (CRESTOR ) 10 MG tablet Take 1 tablet (10 mg total) by mouth daily. 90 tablet 1   CVS ASPIRIN  ADULT LOW DOSE 81 MG chewable tablet CHEW 1 TABLET (81 MG TOTAL) BY MOUTH 2 (TWO) TIMES DAILY. 180 tablet 1    metFORMIN  (GLUCOPHAGE ) 500 MG tablet Take 1 tablet (500 mg total) by mouth 2 (two) times daily with a meal. 180 tablet 3   Semaglutide , 1 MG/DOSE, 4 MG/3ML SOPN Inject 1 mg as directed once a week. 9 mL 3   No current facility-administered medications for this visit.    PHYSICAL EXAM: Vitals:   06/11/24 1110  BP: 120/84  Pulse: 88  SpO2: 98%  Weight: 181 lb (82.1 kg)  Height: 5' 6 (1.676 m)   Body mass index is 29.21 kg/m.  Wt Readings from Last 3 Encounters:  06/11/24 181 lb (82.1 kg)  04/02/24 180 lb 3.2 oz (81.7 kg)  03/20/24 186 lb (84.4 kg)    General: Well developed, well nourished female in no apparent distress.  HEENT: AT/[place], no external lesions.  Eyes: Conjunctiva clear and no icterus. Neck: Neck supple  Lungs: Respirations not labored Neurologic: Alert, oriented, normal speech Extremities / Skin: Dry.  Psychiatric: Does not appear depressed or anxious  Diabetic Foot Exam - Simple   Simple Foot Form Diabetic Foot exam was performed with the following findings: Yes 06/11/2024 11:26 AM  Visual Inspection No deformities, no ulcerations, no other skin breakdown bilaterally: Yes Sensation Testing Intact to touch and monofilament testing bilaterally: Yes Pulse Check Posterior Tibialis and Dorsalis pulse intact bilaterally: Yes Comments    LABS Reviewed Lab Results  Component Value Date   HGBA1C 5.8 (A) 06/11/2024   HGBA1C 6.0 (H) 01/08/2024   HGBA1C 5.6 12/13/2023   No results found for: FRUCTOSAMINE Lab Results  Component Value Date   CHOL 171 01/08/2024   HDL 63 01/08/2024   LDLCALC 83 01/08/2024   TRIG 150 (H) 01/08/2024   CHOLHDL 2.7 01/08/2024   Lab Results  Component Value Date   MICRALBCREAT NOTE 01/08/2024   MICRALBCREAT 12 12/13/2023   Lab Results  Component Value Date  CREATININE 1.45 (H) 02/07/2024   No results found for: GFR   Latest Reference Range & Units 09/08/22 10:04  Microalb, Ur mg/dL 0.4  MICROALB/CREAT RATIO <30  mcg/mg creat 11  Creatinine, Urine 20 - 275 mg/dL 35    ASSESSMENT / PLAN  1. Type 2 diabetes mellitus without complication, without long-term current use of insulin  (HCC)   2. Controlled type 2 diabetes mellitus without complication, without long-term current use of insulin  (HCC)     Diabetes Mellitus type 2, no known complications. - Diabetic status / severity: Controlled.  Lab Results  Component Value Date   HGBA1C 5.8 (A) 06/11/2024    - Hemoglobin A1c goal : <6.5%   - Medications: See below.  I) continue metformin  500 mg 2 times a day.  Advised to take with meals. II) continue Ozempic  1 mg weekly.  Consider to increase Ozempic  for weight loss benefit.  Patient wants to stay on current dose for now.  - Home glucose testing: Few times a week in the morning fasting.  Asked to bring glucometer in the follow-up visit.  - Discussed/ Gave Hypoglycemia treatment plan.  # Consult : No consult required at this time.  # Annual urine for microalbuminuria/ creatinine ratio, no microalbuminuria currently, continue ACE/ARB /losartan . Last  Lab Results  Component Value Date   MICRALBCREAT NOTE 01/08/2024   She had elevated creatinine and low eGFR consistent with AKI when she had hip surgery in July 2025.  Will recheck BMP with eGFR today.  # Foot check nightly.  # Annual dilated diabetic eye exams.   - Diet: Make healthy diabetic food choices - Life style / activity / exercise: Discussed.  2. Blood pressure  -  BP Readings from Last 1 Encounters:  06/11/24 120/84    - Control is in target.  - No change in current plans.  3. Lipid status / Hyperlipidemia - Last  Lab Results  Component Value Date   LDLCALC 83 01/08/2024   - Continue rosuvastatin  10 mg daily.  Diagnoses and all orders for this visit:  Type 2 diabetes mellitus without complication, without long-term current use of insulin  (HCC) -     POCT glycosylated hemoglobin (Hb A1C) -     Basic metabolic panel  with GFR -     metFORMIN  (GLUCOPHAGE ) 500 MG tablet; Take 1 tablet (500 mg total) by mouth 2 (two) times daily with a meal.  Controlled type 2 diabetes mellitus without complication, without long-term current use of insulin  (HCC) -     Semaglutide , 1 MG/DOSE, 4 MG/3ML SOPN; Inject 1 mg as directed once a week.    DISPOSITION Follow up in clinic in 6 months suggested.   All questions answered and patient verbalized understanding of the plan.  Maria Arpin, MD [facility] [address] [place], [address] [place], [place] [address] Phone # [phone]  At least part of this note was generated using voice recognition software. Inadvertent word errors may have occurred, which were not recognized during the proofreading process.

## 2024-07-19 ENCOUNTER — Encounter: Payer: Self-pay | Admitting: Internal Medicine

## 2024-07-22 ENCOUNTER — Other Ambulatory Visit: Payer: Self-pay

## 2024-07-22 DIAGNOSIS — I10 Essential (primary) hypertension: Secondary | ICD-10-CM

## 2024-07-22 DIAGNOSIS — E119 Type 2 diabetes mellitus without complications: Secondary | ICD-10-CM

## 2024-07-22 DIAGNOSIS — E785 Hyperlipidemia, unspecified: Secondary | ICD-10-CM

## 2024-07-22 MED ORDER — ROSUVASTATIN CALCIUM 10 MG PO TABS: 10.0000 mg | ORAL_TABLET | Freq: Every day | ORAL | 1 refills | Status: AC

## 2024-07-22 MED ORDER — METFORMIN HCL 500 MG PO TABS: 500.0000 mg | ORAL_TABLET | Freq: Two times a day (BID) | ORAL | 3 refills | Status: AC

## 2024-07-22 MED ORDER — METOPROLOL TARTRATE 50 MG PO TABS: 50.0000 mg | ORAL_TABLET | Freq: Every day | ORAL | 1 refills | Status: AC

## 2024-07-22 MED ORDER — LOSARTAN POTASSIUM 100 MG PO TABS: 100.0000 mg | ORAL_TABLET | Freq: Every day | ORAL | 1 refills | Status: AC

## 2024-07-22 MED ORDER — FUROSEMIDE 40 MG PO TABS: 40.0000 mg | ORAL_TABLET | Freq: Every day | ORAL | 1 refills | Status: AC

## 2024-07-22 MED ORDER — AMLODIPINE BESYLATE 2.5 MG PO TABS: 2.5000 mg | ORAL_TABLET | Freq: Every day | ORAL | 1 refills | Status: AC

## 2024-08-19 ENCOUNTER — Ambulatory Visit: Admitting: Internal Medicine

## 2024-08-27 NOTE — Progress Notes (Incomplete)
 "   Patient Care Team: Perri Ronal PARAS, MD as PCP - General (Internal Medicine) Lloyd Savannah, MD as Referring Physician (Dermatology) Izell Domino, MD as Attending Physician (Radiation Oncology)  Visit Date: 08/27/24  Subjective:    Patient ID: Maria Mcpherson , Female   DOB: [birth year], 69 y.o.    MRN: [id number]   69 y.o. Female presents today for 6 month follow up for ***. Patient has a past medical history of ***.  History of Hypertension treated with Amlodipine  2.5 mg daily, Losartan  100 mg daily, and Metoprolol  tartrate 50 mg daily. Blood Pressure is *** at ***. Dependent Edema treated with Lasix  40 mg daily.    History of Hyperlipidemia treated with Rosuvastatin  10 mg daily. 01/08/2024 Lipid Panel: Triglycerides 150, decreased from 181 in 03/2023, elevated from 124 in 08/2022.    History of Impaired Glucose Tolerance treated with Metformin  500 mg twice daily.   History of BMI 30, today weighs 186 lbsBMI 30.02, a 12 pound loss from 194 lbs 6.4 ozBMI 31.14 in 10/2022. Weightloss managed with Semaglutide  1 mg injected weekly. Eye exam UTD through [facility] - requesting records.    S/p LEFT TKA; Right Hip Pain has been followed by Dr. Vernetta. They have discussed RIGHT TKA, and she says that she would like to have this done as she has started limping, has had injection in right hip on 10/11/2023.   Past Medical History:  Diagnosis Date   Arthritis    Bilateral swelling of legs and ankles    Borderline diabetes    Diabetes mellitus without complication (HCC)    Fibrocystic breast disease    Heart murmur    very slight   History of nonmelanoma skin cancer    HTN (hypertension)    Hx of blood clots    Hyperlipidemia    Hypokalemia    Prediabetes    pulmonary emboli 04/2005   same time as oophrectomy/myomectomy, evlauation was negative   Skin cancer    Sleep apnea    CPAP     Family History  Problem Relation Age of Onset   Stomach cancer Mother    Breast cancer  Mother 3       mid 56s   Hypertension Mother    Hyperlipidemia Mother    Cancer Mother    Leukemia Father    Stroke Maternal Grandfather    Colon cancer Neg Hx    Esophageal cancer Neg Hx     Social History   Social History Narrative   Not on file      ROS      Objective:   Vitals: LMP 12/04/2013    Physical Exam    Results:   Studies obtained and personally reviewed by me:  Imaging, colonoscopy, mammogram, bone density scan, echocardiogram, heart cath, stress test, CT calcium  score, etc. ***   Labs:       Component Value Date/Time   NA 142 06/11/2024 1139   K 4.1 06/11/2024 1139   CL 103 06/11/2024 1139   CO2 31 06/11/2024 1139   GLUCOSE 98 06/11/2024 1139   BUN 24 06/11/2024 1139   CREATININE 0.81 06/11/2024 1139   CALCIUM  9.7 06/11/2024 1139   PROT 6.5 01/08/2024 1222   ALBUMIN 4.2 03/23/2017 1056   AST 23 01/08/2024 1222   ALT 19 01/08/2024 1222   ALKPHOS 73 03/23/2017 1056   BILITOT 0.7 01/08/2024 1222   GFRNONAA 40 (L) 02/07/2024 0648   GFRNONAA 78 01/19/2021 1057   GFRAA  90 01/19/2021 1057     Lab Results  Component Value Date   WBC 11.1 (H) 02/07/2024   HGB 12.3 02/07/2024   HCT 36.7 02/07/2024   MCV 91.5 02/07/2024   PLT 237 02/07/2024    Lab Results  Component Value Date   CHOL 171 01/08/2024   HDL 63 01/08/2024   LDLCALC 83 01/08/2024   TRIG 150 (H) 01/08/2024   CHOLHDL 2.7 01/08/2024    Lab Results  Component Value Date   HGBA1C 5.8 (A) 06/11/2024     Lab Results  Component Value Date   TSH 0.73 01/08/2024     No results found for: PSA1, PSA *** delete for female pts  ***    Assessment & Plan:   ***    I,Makayla C Reid,acting as a scribe for Ronal JINNY Hailstone, MD.,have documented all relevant documentation on the behalf of Ronal JINNY Hailstone, MD,as directed by  Ronal JINNY Hailstone, MD while in the presence of Ronal JINNY Hailstone, MD.   ***   "

## 2024-09-03 ENCOUNTER — Other Ambulatory Visit

## 2024-09-10 ENCOUNTER — Ambulatory Visit: Admitting: Internal Medicine

## 2024-09-18 ENCOUNTER — Ambulatory Visit: Admitting: Orthopaedic Surgery

## 2024-12-10 ENCOUNTER — Ambulatory Visit: Admitting: Endocrinology
